# Patient Record
Sex: Female | Born: 1955 | Race: Black or African American | Hispanic: No | Smoking: Former smoker
Health system: Southern US, Community
[De-identification: ages and names within clinical notes are randomized; demographics above are authoritative.]

## PROBLEM LIST (undated history)

## (undated) DIAGNOSIS — G4700 Insomnia, unspecified: Secondary | ICD-10-CM

## (undated) DIAGNOSIS — I4510 Unspecified right bundle-branch block: Secondary | ICD-10-CM

## (undated) DIAGNOSIS — E785 Hyperlipidemia, unspecified: Secondary | ICD-10-CM

## (undated) DIAGNOSIS — H409 Unspecified glaucoma: Secondary | ICD-10-CM

## (undated) DIAGNOSIS — K219 Gastro-esophageal reflux disease without esophagitis: Secondary | ICD-10-CM

## (undated) DIAGNOSIS — A809 Acute poliomyelitis, unspecified: Secondary | ICD-10-CM

## (undated) DIAGNOSIS — N393 Stress incontinence (female) (male): Secondary | ICD-10-CM

## (undated) DIAGNOSIS — G4730 Sleep apnea, unspecified: Secondary | ICD-10-CM

## (undated) DIAGNOSIS — F1910 Other psychoactive substance abuse, uncomplicated: Secondary | ICD-10-CM

## (undated) DIAGNOSIS — J45909 Unspecified asthma, uncomplicated: Secondary | ICD-10-CM

## (undated) DIAGNOSIS — E1140 Type 2 diabetes mellitus with diabetic neuropathy, unspecified: Secondary | ICD-10-CM

## (undated) DIAGNOSIS — IMO0002 Reserved for concepts with insufficient information to code with codable children: Secondary | ICD-10-CM

## (undated) DIAGNOSIS — F419 Anxiety disorder, unspecified: Secondary | ICD-10-CM

## (undated) DIAGNOSIS — I639 Cerebral infarction, unspecified: Secondary | ICD-10-CM

## (undated) DIAGNOSIS — I5032 Chronic diastolic (congestive) heart failure: Secondary | ICD-10-CM

## (undated) DIAGNOSIS — M1990 Unspecified osteoarthritis, unspecified site: Secondary | ICD-10-CM

## (undated) DIAGNOSIS — I10 Essential (primary) hypertension: Secondary | ICD-10-CM

## (undated) DIAGNOSIS — G629 Polyneuropathy, unspecified: Secondary | ICD-10-CM

## (undated) DIAGNOSIS — I739 Peripheral vascular disease, unspecified: Secondary | ICD-10-CM

## (undated) DIAGNOSIS — T7840XA Allergy, unspecified, initial encounter: Secondary | ICD-10-CM

## (undated) DIAGNOSIS — E11311 Type 2 diabetes mellitus with unspecified diabetic retinopathy with macular edema: Secondary | ICD-10-CM

## (undated) DIAGNOSIS — C55 Malignant neoplasm of uterus, part unspecified: Secondary | ICD-10-CM

## (undated) HISTORY — PX: CHOLECYSTECTOMY: SHX55

## (undated) HISTORY — PX: CARPAL TUNNEL RELEASE: SHX101

## (undated) HISTORY — DX: Stress incontinence (female) (male): N39.3

## (undated) HISTORY — PX: OTHER SURGICAL HISTORY: SHX169

## (undated) HISTORY — DX: Allergy, unspecified, initial encounter: T78.40XA

## (undated) HISTORY — DX: Cerebral infarction, unspecified: I63.9

## (undated) HISTORY — DX: Hyperlipidemia, unspecified: E78.5

## (undated) HISTORY — DX: Unspecified right bundle-branch block: I45.10

## (undated) HISTORY — DX: Malignant neoplasm of uterus, part unspecified: C55

## (undated) HISTORY — DX: Unspecified glaucoma: H40.9

## (undated) HISTORY — DX: Reserved for concepts with insufficient information to code with codable children: IMO0002

## (undated) HISTORY — PX: HERNIA REPAIR: SHX51

## (undated) HISTORY — DX: Anxiety disorder, unspecified: F41.9

## (undated) HISTORY — DX: Unspecified osteoarthritis, unspecified site: M19.90

## (undated) HISTORY — DX: Type 2 diabetes mellitus with diabetic neuropathy, unspecified: E11.40

## (undated) HISTORY — DX: Essential (primary) hypertension: I10

## (undated) HISTORY — DX: Type 2 diabetes mellitus with unspecified diabetic retinopathy with macular edema: E11.311

## (undated) HISTORY — DX: Other psychoactive substance abuse, uncomplicated: F19.10

## (undated) HISTORY — DX: Acute poliomyelitis, unspecified: A80.9

## (undated) HISTORY — DX: Chronic diastolic (congestive) heart failure: I50.32

## (undated) HISTORY — DX: Insomnia, unspecified: G47.00

---

## 1998-02-19 ENCOUNTER — Emergency Department (HOSPITAL_COMMUNITY): Admission: EM | Admit: 1998-02-19 | Discharge: 1998-02-19 | Payer: Self-pay | Admitting: Emergency Medicine

## 1998-07-30 DIAGNOSIS — C55 Malignant neoplasm of uterus, part unspecified: Secondary | ICD-10-CM

## 1998-07-30 HISTORY — DX: Malignant neoplasm of uterus, part unspecified: C55

## 2002-08-12 ENCOUNTER — Ambulatory Visit (HOSPITAL_COMMUNITY): Admission: RE | Admit: 2002-08-12 | Discharge: 2002-08-12 | Payer: Self-pay | Admitting: Cardiology

## 2002-12-30 ENCOUNTER — Ambulatory Visit (HOSPITAL_BASED_OUTPATIENT_CLINIC_OR_DEPARTMENT_OTHER): Admission: RE | Admit: 2002-12-30 | Discharge: 2002-12-31 | Payer: Self-pay | Admitting: Orthopedic Surgery

## 2003-01-12 ENCOUNTER — Encounter (HOSPITAL_COMMUNITY): Admission: RE | Admit: 2003-01-12 | Discharge: 2003-02-11 | Payer: Self-pay | Admitting: Orthopedic Surgery

## 2003-02-15 ENCOUNTER — Encounter (HOSPITAL_COMMUNITY): Admission: RE | Admit: 2003-02-15 | Discharge: 2003-03-17 | Payer: Self-pay | Admitting: Orthopedic Surgery

## 2005-02-12 ENCOUNTER — Ambulatory Visit: Payer: Self-pay | Admitting: Family Medicine

## 2005-02-12 ENCOUNTER — Ambulatory Visit (HOSPITAL_COMMUNITY): Admission: RE | Admit: 2005-02-12 | Discharge: 2005-02-12 | Payer: Self-pay | Admitting: Physician Assistant

## 2005-02-16 ENCOUNTER — Encounter (HOSPITAL_COMMUNITY): Admission: RE | Admit: 2005-02-16 | Discharge: 2005-03-18 | Payer: Self-pay | Admitting: Family Medicine

## 2005-03-07 ENCOUNTER — Ambulatory Visit: Payer: Self-pay | Admitting: Family Medicine

## 2005-03-08 ENCOUNTER — Ambulatory Visit (HOSPITAL_COMMUNITY): Admission: RE | Admit: 2005-03-08 | Discharge: 2005-03-08 | Payer: Self-pay | Admitting: Family Medicine

## 2005-03-20 ENCOUNTER — Encounter (HOSPITAL_COMMUNITY): Admission: RE | Admit: 2005-03-20 | Discharge: 2005-04-19 | Payer: Self-pay | Admitting: Family Medicine

## 2005-05-17 ENCOUNTER — Ambulatory Visit: Payer: Self-pay | Admitting: Family Medicine

## 2005-07-26 ENCOUNTER — Ambulatory Visit: Payer: Self-pay | Admitting: Family Medicine

## 2005-07-27 ENCOUNTER — Ambulatory Visit: Payer: Self-pay | Admitting: Family Medicine

## 2005-07-31 ENCOUNTER — Ambulatory Visit: Payer: Self-pay | Admitting: Family Medicine

## 2005-08-03 ENCOUNTER — Ambulatory Visit: Payer: Self-pay | Admitting: Family Medicine

## 2005-09-11 ENCOUNTER — Ambulatory Visit: Payer: Self-pay | Admitting: Family Medicine

## 2005-11-12 ENCOUNTER — Ambulatory Visit: Payer: Self-pay | Admitting: Family Medicine

## 2005-11-20 ENCOUNTER — Ambulatory Visit: Payer: Self-pay | Admitting: Family Medicine

## 2005-12-26 ENCOUNTER — Encounter: Admission: RE | Admit: 2005-12-26 | Discharge: 2005-12-26 | Payer: Self-pay | Admitting: Orthopedic Surgery

## 2006-02-15 ENCOUNTER — Ambulatory Visit: Payer: Self-pay | Admitting: Family Medicine

## 2006-05-31 ENCOUNTER — Ambulatory Visit: Payer: Self-pay | Admitting: Cardiology

## 2006-06-05 ENCOUNTER — Ambulatory Visit: Payer: Self-pay | Admitting: Cardiology

## 2007-07-31 ENCOUNTER — Emergency Department (HOSPITAL_COMMUNITY): Admission: EM | Admit: 2007-07-31 | Discharge: 2007-07-31 | Payer: Self-pay | Admitting: Emergency Medicine

## 2010-12-15 NOTE — Cardiovascular Report (Signed)
NAME:  Kathy Hardy, Kathy Hardy                        ACCOUNT NO.:  [id number]   MEDICAL RECORD NO.:  [id number]                   PATIENT TYPE:  OIB   LOCATION:  2891                                 FACILITY:  [facility]   PHYSICIAN:  Salvadore Farber, M.D. [facility]         DATE OF BIRTH:  01-25-1956   DATE OF PROCEDURE:  08/12/2002  DATE OF DISCHARGE:                              CARDIAC CATHETERIZATION   PROCEDURES:  Left heart catheterization, left ventriculography, coronary  angiography.   INDICATIONS:  The patient is a 55 year old lady with diabetes mellitus and  chest pain.  She has had prior dobutamine Cardiolite in which not 85%  maximal heart rate was not achieved.  At this submaximal work load, there  was no evidence of ischemia.  However, the patient has continued to have  chest discomfort concerning for myocardial ischemia.  She is therefore  referred for diagnostic angiography to clear the air.   DIAGNOSTIC TECHNIQUE:  Informed consent was obtained. Under 1% lidocaine  local anesthesia, a 6 French sheath was placed in the right femoral artery  using the modified Seldinger technique.  The JL4 and JR4 catheters were used  for coronary angiography.  Left heart catheterization and RAO  ventriculography were performed using a pigtail catheter.  The patient  tolerated the procedure well and was transferred to the holding room in  stable condition.  Sheaths could be removed there.   COMPLICATIONS:  None.   FINDINGS:  1. Hemodynamic:  LV 87/1/3.  2. Ventriculography:  Ejection fraction 70% without regional wall motion     abnormality.  There is no aortic stenosis or mitral regurgitation.   CORONARY ANGIOGRAPHY:  1. Left main:  Angiographically normal.  2. LAD:  The LAD is a moderate sized vessel giving rise to a single diagonal     branch.  It is angiographically normal.  3. Circumflex:  The circumflex is a large vessel giving rise to three obtuse     marginal branches.  It is  angiographically normal.  4. RCA:  The RCA is a dominant vessel.  It is angiographically normal.    IMPRESSION/PLAN:  Angiographically normal coronary arteries.  Normal left  ventricular size and systolic function. No aortic stenosis or mitral  regurgitation.  The patient is to follow up with Dr. Celene Skeen and Dr.  Diona Browner.                                                    Salvadore Farber, M.D. [facility]    WED/MEDQ  D:  08/12/2002  T:  08/12/2002  Job:  [id number]   cc:   Jonelle Sidle, M.D. [facility]  [address]  [place]  [place] [address]  Fax: 1   Charlesetta Shanks  [phone].  [place]  [place] [address]  Fax: [phone]

## 2010-12-15 NOTE — Op Note (Signed)
   NAME:  Kathy Hardy, Kathy Hardy NO.:  [id number]   MEDICAL RECORD NO.:  [id number]                  PATIENT TYPE:   LOCATION:                                       FACILITY:  [facility]   PHYSICIAN:  Artist Pais. Mina Marble, M.D.           DATE OF BIRTH:   DATE OF PROCEDURE:  12/30/2002  DATE OF DISCHARGE:                                 OPERATIVE REPORT   PREOPERATIVE DIAGNOSIS:  Left shoulder impingement syndrome with partial  rotator cuff tear.   POSTOPERATIVE DIAGNOSIS:  Left shoulder impingement syndrome with partial  rotator cuff tear.   PROCEDURE:  Left shoulder arthroscopy with subacromial decompression, distal  clavicle excision, rotator cuff debridement.   SURGEON:  Artist Pais. Mina Marble, M.D.   ASSISTANT:  _______________.   ANESTHESIA:  General.   COMPLICATIONS:  None   DRAINS:  None.   OPERATIVE REPORT:  Patient was taken to the operating room after the  induction of adequate general anesthesia.  The left upper extremity was  prepped and draped in the usual sterile fashion after she was placed in the  beach-chair position and well padded over all bony prominences.  Once this  was done, a standard posterior shoulder arthroscopy portal was established,  and scope was introduced into the joint, and an anterior portal was  established under direct vision.  Visualization of the joint revealed an  intact biceps labral insertion, no fraying of the labrum anteriorly,  posteriorly, inferiorly, or superiorly.  There was no evidence of labral  detachment, and the glenoid and the humeral head appeared to be intact.  The  cuff appeared to be intact grossly from gross inspection.  Subacromial  inspection was then undertaken with the instruments placed in subacromial  space.  Subacromial bursectomy and decompression were undertaken using a  suction shaver and a 6-mm burr.  The distal clavicle was also excised  through an anterior portal and a lateral portal.   Once this was done, the  cuff was inspected; there was a small, significantly anterior partial-  thickness tear, which was debrided to a stable rim on the bursal side.  After this was done, the instruments were removed from the portal, the  portals were closed with 4-0 nylon, and a sterile shoulder compression  dressing was applied.  Patient tolerated the procedure well, went to  recovery in stable fashion.                                               Artist Pais Mina Marble, M.D.    MAW/MEDQ  D:  12/30/2002  T:  12/30/2002  Job:  [id number]

## 2010-12-15 NOTE — Assessment & Plan Note (Signed)
[facility]                            [facility] OFFICE NOTE   ELLIYAH, LISZEWSKI                     MRN:          [id number]  DATE:05/31/2006                            DOB:          [birth year]    PRIMARY CARDIOLOGIST:  Jonelle Sidle, M.D.   HISTORY OF THE PRESENT ILLNESS:  Ms. Kercheval is a very pleasant 55 year old  female with no known history of coronary artery disease; however,  with a  history of previous cardiac evaluation in 2003 by Dr. Simona Huh at  [facility].  She was referred for evaluation of chest pain at that  time in the context of multiple cardiac risk factors including insulin-  dependent diabetes mellitus; and, was referred for a dobutamine stress  Cardiolite after ruling out for myocardial infarction.  The perfusion study  showed no focal wall motion abnormalities and normal left ventricular  function; although, however, the patient did not achieve 85% of predicted  heart rate despite the addition of atropine.   The patient is now referred for evaluation of chest pain, which she states  is dissimilar from that when she presented 2003.  This has been waxing and  waning for the past several weeks.  In fact, she apparently was also  hospitalized here at [facility] in August 2007, again, for overnight  observation and again with all serial cardiac markers within normal limits.  Her symptoms are quite atypical.  They are exacerbated by lying in the  supine position or with deep inspiration, occasionally with walking, with  palpation of the lower sternum, and with swallowing food.  Regarding the  latter she, in fact, states that she does have difficulty swallowing and has  a sensation that the food is stuck in her throat.   An electrocardiogram in our office today reveals sinus tachycardia with  nonspecific ST abnormalities.  This was compared with previous EKGs from  2003, which were also notable for sinus  tachycardia and Q waves in leads 3  and aVF.   ALLERGIES:  No known drug allergies.   CURRENT MEDICATIONS:  1. NovoLog 70/30, 30 units twice a day.  2. Plavix.  3. Albuterol inhaler as needed .  4. Neurontin 300 mg daily.  5. Hydrochlorothiazide 25 mg daily.  6. Lotrel 5/20 daily.  7. Clonidine 0.1 twice a day.  8. Premarin.  9. Ambien 10 mg at bedtime.  10.Simvastatin 40 daily.  11.Metformin XR 1,000 mg daily.   PAST MEDICAL HISTORY:  1. Insulin-dependent diabetes mellitus.  2. Hypertension.  3. 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  4. 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  5. Hyperlipidemia.  6. Gastroesophageal reflux disease.  7. Allergic rhinitis.  8. Asthma.   PAST SURGICAL HISTORY:  Status post cholecystectomy.   SOCIAL HISTORY:  The patient has never smoked tobacco.  She denies alcohol  use. She has two grown sons. She is unable to work secondary to her  disability.   FAMILY HISTORY:  Father died of unknown causes when the patient was 55 years  old.  She has a  sister with diabetes and heart problems.   REVIEW OF SYSTEMS:  The patient reports chronic orthopnea for several years  and recent occasional PND.  She reports no significant lower extremity  edema; however, but, also suggests symptoms of lower extremity numbness, but  no clear discomfort.  She has occasional heartburn.  She has dysphagia as  noted.  She has had intermittent chest tightness for the past several weeks  as noted above, which is dissimilar from her prior presentations in 2003.  The remaining systems are negative.   PHYSICAL EXAMINATION:  VITAL SIGNS:  Blood pressure is 124/80, pulse 104 and  regular, and weight 165 pounds.  GENERAL APPEARANCE:  In general this is a 55 year old female, mildly obese  who is sitting and is in no apparent distress.  HEENT:  Normocephalic and atraumatic.  NECK:  Palpable bilateral carotid pulses without bruits.  No JVD.   LUNGS:  The lungs are clear to auscultation in all fields.  HEART:  The heart has a regular rhythm and an increased rate, S1 and S2.  No  significant murmurs.  No rubs or gallops.  ABDOMEN:  The abdomen is soft and nontender.  There is a palpable pulsatile  aorta, but with no associated bruits in the epigastrium.  EXTREMITIES:  Palpable femoral pulses without bruits. Palpable peripheral  pulses, but with diminished right posterior tibialis pulse and diminished  right popliteal pulses.  No significant pedal edema.  NEUROLOGIC EXAMINATION:  Slightly slurred speech, but otherwise alert and  oriented.   IMPRESSION:  Ms. Nile is a 55 year old female with cardiac risk factors,  but no previously documented coronary artery disease, with previous workup  for chest pain in 2003.  At that time she had a nondiagnostic  dobutamine  stress Cardiolite, who is now referred for recurrent, persistent chest pain.  The patient has had two recent hospitalizations here at [facility] and in both  cases ruled out for myocardial infarction with normal serial cardiac  markers.  Her symptoms are quite atypical, and, in fact, are exacerbated by  either lying supine, deep inspiration, palpations, and at times with  swallowing food.  Of note, her electrocardiogram is unchanged from 2003; and  in fact, is notable for persistent sinus tachycardia.  She did have a D-  dimer during this most recent hospitalization, which was negative.   PLAN:  1. Following review with Dr. Willa Rough recommendations to repeat her      stress test (done in 2003) and we will therefore schedule another      dobutamine stress Cardiolite.  2. We did consider the addition of a beta blocker given her persistent      sinus tachycardia, but we will hold off on the addition of this at the      present time.  Of note the preferred choice for her, given her diabetes     would be carvedilol.  Of note, however, the patient also reportedly has       a history of asthma and we would need to keep this in mind.  3. We will plan to have the patient return to the clinic to follow up with      me in approximately two weeks for review of the study results and      further recommendations.      Gene Serpe, PA-C  Electronically Signed     ______________________________  Luis Abed, MD, [facility]   GS/MedQ  DD: 05/31/2006  DT: 06/01/2006  Job #:  [id number]   cc:   Alfredia Client, M.D.

## 2011-03-07 ENCOUNTER — Encounter (INDEPENDENT_AMBULATORY_CARE_PROVIDER_SITE_OTHER): Payer: Self-pay | Admitting: Ophthalmology

## 2011-03-08 ENCOUNTER — Ambulatory Visit: Payer: Self-pay | Admitting: Family Medicine

## 2011-03-14 ENCOUNTER — Encounter (INDEPENDENT_AMBULATORY_CARE_PROVIDER_SITE_OTHER): Payer: Medicaid Other | Admitting: Ophthalmology

## 2011-03-14 DIAGNOSIS — E11359 Type 2 diabetes mellitus with proliferative diabetic retinopathy without macular edema: Secondary | ICD-10-CM

## 2011-03-14 DIAGNOSIS — H3581 Retinal edema: Secondary | ICD-10-CM

## 2011-03-14 DIAGNOSIS — H43819 Vitreous degeneration, unspecified eye: Secondary | ICD-10-CM

## 2011-03-14 DIAGNOSIS — H4310 Vitreous hemorrhage, unspecified eye: Secondary | ICD-10-CM

## 2011-03-15 ENCOUNTER — Encounter: Payer: Self-pay | Admitting: Family Medicine

## 2011-03-15 ENCOUNTER — Ambulatory Visit (INDEPENDENT_AMBULATORY_CARE_PROVIDER_SITE_OTHER): Payer: Medicaid Other | Admitting: Family Medicine

## 2011-03-15 VITALS — BP 140/84 | HR 97 | Ht 62.0 in | Wt 167.0 lb

## 2011-03-15 DIAGNOSIS — I10 Essential (primary) hypertension: Secondary | ICD-10-CM

## 2011-03-15 DIAGNOSIS — E785 Hyperlipidemia, unspecified: Secondary | ICD-10-CM

## 2011-03-15 DIAGNOSIS — E119 Type 2 diabetes mellitus without complications: Secondary | ICD-10-CM

## 2011-03-15 DIAGNOSIS — F411 Generalized anxiety disorder: Secondary | ICD-10-CM | POA: Insufficient documentation

## 2011-03-15 DIAGNOSIS — E118 Type 2 diabetes mellitus with unspecified complications: Secondary | ICD-10-CM | POA: Insufficient documentation

## 2011-03-15 DIAGNOSIS — G8929 Other chronic pain: Secondary | ICD-10-CM

## 2011-03-15 MED ORDER — ATORVASTATIN CALCIUM 40 MG PO TABS: 40.0000 mg | ORAL_TABLET | Freq: Every day | ORAL | Status: DC

## 2011-03-15 MED ORDER — FUROSEMIDE 20 MG PO TABS: 20.0000 mg | ORAL_TABLET | Freq: Every day | ORAL | Status: DC

## 2011-03-15 MED ORDER — CLOPIDOGREL BISULFATE 75 MG PO TABS: 75.0000 mg | ORAL_TABLET | Freq: Every day | ORAL | Status: DC

## 2011-03-15 MED ORDER — INSULIN ASPART PROT & ASPART (70-30 MIX) 100 UNIT/ML ~~LOC~~ SUSP: 35.0000 [IU] | Freq: Two times a day (BID) | SUBCUTANEOUS | Status: DC

## 2011-03-15 MED ORDER — TRAMADOL HCL 50 MG PO TABS: 50.0000 mg | ORAL_TABLET | Freq: Two times a day (BID) | ORAL | Status: DC | PRN

## 2011-03-15 MED ORDER — ALPRAZOLAM 0.5 MG PO TABS: 0.5000 mg | ORAL_TABLET | Freq: Two times a day (BID) | ORAL | Status: DC | PRN

## 2011-03-15 NOTE — Assessment & Plan Note (Signed)
Continue current medications. Labs will be drawn
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
## 2011-03-15 NOTE — Assessment & Plan Note (Signed)
The patient has history of chronic pain. There is a note from her primary care provider which states no narcotics. However this cannot be explained to specific reason. I reviewed the CT scan that she had performed for her abdominal pain which was normal. She also evidently had a hip x-ray which I do not have but her PCP states was negative. She also had an x-ray of her lower back which showed spondylosis but otherwise negative. Her pain does not fit typical back pain with radiculopathy. She previous had radicular symptoms coming from her lower abdomen and possibly pelvis. She has had a hysterectomy and no abnormal bleeding I would consider getting an ultrasound of her pelvis but I'm not sure if this has been done already. Her ovaries likely will not be seen at this stage unless there is a mass. She will also need a pelvic exam. I have given her a short-term supply of tramadol. More records will need to be obtained. It's possible her pain is related to her previous strokes however the abdominal component does not make sense. Polio affects the opposite side.

## 2011-03-15 NOTE — Assessment & Plan Note (Signed)
She has history of anxiety disorder. I have given her a refill on her Xanax. Next visit especially after I have reviewed notes we will discuss long-term medication for her nerves and anxiety such as an SSRI

## 2011-03-15 NOTE — Assessment & Plan Note (Signed)
Patient will continue current regimen of insulin. It appears they were titrating her insulin back-and-forth, she states it was secondary to her stress. I do not see a recent A1c. A1c ordered

## 2011-03-15 NOTE — Patient Instructions (Signed)
For your pain- Take the tramadol twice a day for pain For your diabetes- continue your insulin, I will check your diabetes labs For your nerves- I given a short term supply of xanax For your blood pressure- continue current medications, you have labs that need to be done For your cholesterol- We will check your lipid panel- do not eat after midnight I will get records from your other doctors Follow-up in 6 weeks. Please have your done before then.

## 2011-03-16 ENCOUNTER — Telehealth: Payer: Self-pay | Admitting: Family Medicine

## 2011-03-16 ENCOUNTER — Encounter: Payer: Self-pay | Admitting: Family Medicine

## 2011-03-16 DIAGNOSIS — B91 Sequelae of poliomyelitis: Secondary | ICD-10-CM | POA: Insufficient documentation

## 2011-03-16 DIAGNOSIS — R269 Unspecified abnormalities of gait and mobility: Secondary | ICD-10-CM | POA: Insufficient documentation

## 2011-03-16 DIAGNOSIS — M89669 Osteopathy after poliomyelitis, unspecified lower leg: Secondary | ICD-10-CM

## 2011-03-16 NOTE — Telephone Encounter (Signed)
I will send the script for shower bench. She needs to schedule a quick office visit for the UTI in case meds are needed. She can do this next Wed

## 2011-03-16 NOTE — Telephone Encounter (Signed)
Kathy Hardy She needs a shower bench. She has already ordered it and its ready to be delivered. They just need rx for it with diagnosis.  Also, she said when she was here she forgot to mention she thought she had a bladder infection. Frequency and she can only dribble when she does go. Do you want to see her back for an OV or can she come just give specimen since she was just here. She can't come either way until Wed.

## 2011-03-16 NOTE — Telephone Encounter (Signed)
Script sent. Joyce Gross called her to schedule OV for next week

## 2011-03-19 ENCOUNTER — Encounter: Payer: Self-pay | Admitting: Family Medicine

## 2011-03-19 NOTE — Progress Notes (Signed)
  Subjective:    Patient ID: Kathy Hardy, female    DOB: January 17, 1956, 55 y.o.   MRN: [id number]  HPI    Review of Systems     Objective:   Physical Exam        Assessment & Plan:    Documentation from [facility] shows an evaluation for acute back pain.  the date of service was July 16,2012. I do not see any new medications given. Diagnosis was uncontrolled type diabetes and hyperglycemia as well as sciatica. Labs were done being that was within normal limits with exception of glucose 481

## 2011-03-23 ENCOUNTER — Ambulatory Visit: Payer: Medicaid Other | Admitting: Family Medicine

## 2011-03-23 ENCOUNTER — Encounter (INDEPENDENT_AMBULATORY_CARE_PROVIDER_SITE_OTHER): Payer: Medicaid Other | Admitting: Ophthalmology

## 2011-03-23 DIAGNOSIS — H3581 Retinal edema: Secondary | ICD-10-CM

## 2011-03-23 LAB — COMPREHENSIVE METABOLIC PANEL
Calcium: 10 mg/dL (ref 8.4–10.5)
Chloride: 101 mEq/L (ref 96–112)
Total Bilirubin: 0.5 mg/dL (ref 0.3–1.2)

## 2011-03-23 LAB — LIPID PANEL
Cholesterol: 142 mg/dL (ref 0–200)
HDL: 51 mg/dL (ref >39)
LDL Cholesterol: 73 mg/dL (ref 0–99)
Total CHOL/HDL Ratio: 2.8 Ratio
Triglycerides: 92 mg/dL (ref <150)

## 2011-03-23 LAB — HEMOGLOBIN A1C
Hgb A1c MFr Bld: 11.2 % — ABNORMAL HIGH (ref <5.7)
Mean Plasma Glucose: 275 mg/dL — ABNORMAL HIGH (ref <117)

## 2011-04-02 ENCOUNTER — Encounter: Payer: Self-pay | Admitting: Family Medicine

## 2011-04-05 ENCOUNTER — Ambulatory Visit (INDEPENDENT_AMBULATORY_CARE_PROVIDER_SITE_OTHER): Payer: Medicaid Other | Admitting: Family Medicine

## 2011-04-05 ENCOUNTER — Encounter: Payer: Self-pay | Admitting: Family Medicine

## 2011-04-05 VITALS — BP 100/70 | HR 98 | Resp 16 | Ht 62.0 in | Wt 169.0 lb

## 2011-04-05 DIAGNOSIS — R079 Chest pain, unspecified: Secondary | ICD-10-CM

## 2011-04-05 DIAGNOSIS — N3090 Cystitis, unspecified without hematuria: Secondary | ICD-10-CM

## 2011-04-05 DIAGNOSIS — R0789 Other chest pain: Secondary | ICD-10-CM

## 2011-04-05 DIAGNOSIS — E119 Type 2 diabetes mellitus without complications: Secondary | ICD-10-CM

## 2011-04-05 DIAGNOSIS — G8929 Other chronic pain: Secondary | ICD-10-CM

## 2011-04-05 DIAGNOSIS — N390 Urinary tract infection, site not specified: Secondary | ICD-10-CM

## 2011-04-05 LAB — POCT URINALYSIS DIPSTICK: pH, UA: 5

## 2011-04-05 MED ORDER — CEPHALEXIN 500 MG PO CAPS: 500.0000 mg | ORAL_CAPSULE | Freq: Four times a day (QID) | ORAL | Status: AC

## 2011-04-05 MED ORDER — OMEPRAZOLE 20 MG PO CPDR: 20.0000 mg | DELAYED_RELEASE_CAPSULE | Freq: Every day | ORAL | Status: DC

## 2011-04-05 MED ORDER — INSULIN GLARGINE 100 UNIT/ML ~~LOC~~ SOLN: SUBCUTANEOUS | Status: DC

## 2011-04-05 NOTE — Assessment & Plan Note (Signed)
Reviewed her history, often has multiple complaints of pain, neg work-up for chronic abd pain, no narcotics to be given unless source of pain can be identified

## 2011-04-05 NOTE — Assessment & Plan Note (Signed)
Keflex 500mg  QID x 5 days,sent for culture

## 2011-04-05 NOTE — Progress Notes (Signed)
  Subjective:    Patient ID: Kathy Hardy, female    DOB: September 21, 1955, 55 y.o.   MRN: [id number]  HPI Chest pain- started yesterday, has been having epigastric pain, substernal pain and back pain, has nausea when trying to eat, pain last 30 minutes before it goes away, has it in own and off, initially started when she was sitting on the porch, non radiating, had a small sweat initially but none since, no SOB  Dysuria- continues to have side pain, has had burning sensation and foul smelling urine for past few weeks, unable to urinate as usual states only a few drops come out, has some generalized abd pain   Labs reviewed A1C 11.3%- DM- taking Insulin-- 35 units twice a day, states  She has lots of low in the night and in the AM, if her sugar is near 100 she will not take her morning insulin   Review of Systems see above   GEN- + fatigue, +subjective fever, + Chills, denies weight loss,weakness, recent illness CVS- +chest pain, denies palpitations RESP- denies SOB, cough, wheeze ABD- denies  change in stools, + abd pain GU- + dysuria,denies  hematuria, +dribbling,denies  incontinence        Objective:   Physical Exam GEN- NAD, alert and oriented, ambulates with cane CVS- RRR, no murmur RESP-CTAB ABD- soft, mild TTP suprapubic and Left lower quadrant, pelvic region, no rebound, no gaurding, no mass, no CVA tenderness, ND EKG- NSR, HR 93 no ST changes    Assessment & Plan:

## 2011-04-05 NOTE — Patient Instructions (Signed)
Stop taking the  70/30 inuslin Start taking Lantus 20 units at night- You turn the dial to 20 units Take the acid medication Take the antibiotic four times a day for the next 5 days Return in 1 week with your diabetes numbers

## 2011-04-05 NOTE — Assessment & Plan Note (Signed)
Atypical CP, likely GERD related, start reflux medication, EKG normal

## 2011-04-05 NOTE — Assessment & Plan Note (Addendum)
Poor control. Pt is not eating regularly and skips insulin. Will switch to basal insulin instead of mix, short acting is likely causing hypoglycemic based on history Pt shown how to use Lantus pen in office- start 20 units QHS, RTC 1 week

## 2011-04-09 DIAGNOSIS — R55 Syncope and collapse: Secondary | ICD-10-CM

## 2011-04-12 ENCOUNTER — Ambulatory Visit (INDEPENDENT_AMBULATORY_CARE_PROVIDER_SITE_OTHER): Payer: Medicaid Other | Admitting: Family Medicine

## 2011-04-12 ENCOUNTER — Encounter: Payer: Self-pay | Admitting: Family Medicine

## 2011-04-12 VITALS — BP 154/90 | HR 89 | Resp 16 | Ht 62.0 in | Wt 168.8 lb

## 2011-04-12 DIAGNOSIS — E119 Type 2 diabetes mellitus without complications: Secondary | ICD-10-CM

## 2011-04-12 DIAGNOSIS — J310 Chronic rhinitis: Secondary | ICD-10-CM | POA: Insufficient documentation

## 2011-04-12 DIAGNOSIS — R0789 Other chest pain: Secondary | ICD-10-CM

## 2011-04-12 DIAGNOSIS — I10 Essential (primary) hypertension: Secondary | ICD-10-CM

## 2011-04-12 MED ORDER — FLUTICASONE PROPIONATE 50 MCG/ACT NA SUSP: 1.0000 | Freq: Two times a day (BID) | NASAL | Status: DC | PRN

## 2011-04-12 MED ORDER — ALPRAZOLAM 0.5 MG PO TABS: 0.5000 mg | ORAL_TABLET | Freq: Two times a day (BID) | ORAL | Status: DC | PRN

## 2011-04-12 NOTE — Assessment & Plan Note (Signed)
DM is uncontrolled, in the process of titrating Lantus. Increase Lantus to 22units, pt to bring in meter

## 2011-04-12 NOTE — Patient Instructions (Addendum)
I will set you up for the heart doctor to have a stress test Take your other medications as prescribed Increase your lantus to 22 units at bedtime, bring your meter or your glucose record Increase your acid blocking medication- Prilosec to 1 in the morning and 1 in the evening Use the flonase for your runny nose F/U in 3 weeks

## 2011-04-12 NOTE — Progress Notes (Signed)
  Subjective:    Patient ID: Kathy Hardy, female    DOB: 07/25/1956, 55 y.o.   MRN: [id number]  HPI Pt seen in ED over earlier this week, secondary to chest pain- atypical and elevated BP and glucose. She describes a near syncopal event as well. EKG in the office last week for her CP was normal. This visit was to f/u diabetes mellitus and Lantus titration. Continues to have CP on and off, her friend today states she has a lot of anxiety CP substernal, to epigastric, no SOB, occ nausea  DM- she did not bring her meter, states they were less than 100 fasting past few nights, but very high in the ED Completed antibiotics for UTI  Runny nose for past few days, would like something to help with that and sneezing.- No fever Review of Systems - per above     Objective:   Physical Exam GEN- NAD alert and oriented x 3  HEENT- non injected conjunctiva, PERRL, EOMI, oropharynx clear, nares clear rhinorrhea CVS- RRR, no murmur RESP-CTAB EXT- no edema       Assessment & Plan:

## 2011-04-12 NOTE — Assessment & Plan Note (Signed)
Pt CP, atypical in nature, continue PPI. Will send to cardiology for recurrent CP, she does have risk factors of DM, hyperlipidemia, HTN.

## 2011-04-12 NOTE — Assessment & Plan Note (Signed)
Will recheck at next visit., BP labile. Ensure she is taking meds daily

## 2011-04-12 NOTE — Assessment & Plan Note (Signed)
flonase prn 

## 2011-04-13 ENCOUNTER — Encounter: Payer: Self-pay | Admitting: Family Medicine

## 2011-04-13 ENCOUNTER — Encounter (INDEPENDENT_AMBULATORY_CARE_PROVIDER_SITE_OTHER): Payer: Medicaid Other | Admitting: Ophthalmology

## 2011-04-17 ENCOUNTER — Other Ambulatory Visit: Payer: Self-pay

## 2011-04-17 MED ORDER — LISINOPRIL 40 MG PO TABS: 40.0000 mg | ORAL_TABLET | Freq: Every day | ORAL | Status: DC

## 2011-04-26 ENCOUNTER — Ambulatory Visit: Payer: Medicaid Other | Admitting: Family Medicine

## 2011-04-27 ENCOUNTER — Ambulatory Visit (INDEPENDENT_AMBULATORY_CARE_PROVIDER_SITE_OTHER): Payer: Medicaid Other | Admitting: Cardiovascular Disease

## 2011-04-27 ENCOUNTER — Encounter: Payer: Self-pay | Admitting: Cardiovascular Disease

## 2011-04-27 DIAGNOSIS — R079 Chest pain, unspecified: Secondary | ICD-10-CM

## 2011-04-27 NOTE — Patient Instructions (Signed)
**Note De-Identified Shameek Nyquist Obfuscation** Your physician has requested that you have a lexiscan myoview. For further information please visit [email or web address] Please follow instruction sheet, as given.  Your physician recommends that you continue on your current medications as directed. Please refer to the Current Medication list given to you today.  Your physician recommends that you schedule a follow-up appointment in: as needed. We will contact you with test results.

## 2011-04-27 NOTE — Progress Notes (Signed)
Pt seen in ED the second week in September   secondary to chest pain- atypical and elevated BP and glucose. She describes a near syncopal event as well. EKG in the office last week for her CP was normal.  Continues to have CP on and off, her friend today states she has a lot of anxiety CP substernal, to epigastric, no SOB, occ nausea  Multiple previous visits with Dr Diona Browner.  Seen in 2003, 2004 with normal cath in 2004.  Seen in 2007 with normal myovue.  She is anxious a lot.  Has a son and grandson that have gone to jail.  DM fairly well controlled.  Compliant with BP and cholesterol meds  ROS: Denies fever, malais, weight loss, blurry vision, decreased visual acuity, cough, sputum, SOB, hemoptysis, pleuritic pain, palpitaitons, heartburn, abdominal pain, melena, lower extremity edema, claudication, or rash.  All other systems reviewed and negative   General: Affect appropriate Healthy:  appears stated age HEENT: normal Neck supple with no adenopathy JVP normal no bruits no thyromegaly Lungs clear with no wheezing and good diaphragmatic motion Heart:  S1/S2 no murmur,rub, gallop or click PMI normal Abdomen: benighn, BS positve, no tenderness, no AAA no bruit.  No HSM or HJR Distal pulses intact with no bruits No edema Neuro non-focal Skin warm and dry No muscular weakness  Medications Current Outpatient Prescriptions  Medication Sig Dispense Refill  . ALPRAZolam (XANAX) 0.5 MG tablet Take 1 tablet (0.5 mg total) by mouth 2 (two) times daily as needed for sleep or anxiety.  60 tablet  1  . atorvastatin (LIPITOR) 40 MG tablet Take 1 tablet (40 mg total) by mouth daily.  30 tablet  3  . clopidogrel (PLAVIX) 75 MG tablet Take 1 tablet (75 mg total) by mouth daily.  30 tablet  3  . fluticasone (FLONASE) 50 MCG/ACT nasal spray Place 1 spray into the nose 2 (two) times daily as needed for rhinitis.  16 g  2  . furosemide (LASIX) 20 MG tablet Take 1 tablet (20 mg total) by mouth daily.  30  tablet  3  . insulin glargine (LANTUS SOLOSTAR) 100 UNIT/ML injection Inject 20 units at bedtime  3 mL  3  . lisinopril (PRINIVIL,ZESTRIL) 40 MG tablet Take 1 tablet (40 mg total) by mouth daily.  30 tablet  3  . omeprazole (PRILOSEC) 20 MG capsule Take 1 capsule (20 mg total) by mouth daily.  30 capsule  3  . traMADol (ULTRAM) 50 MG tablet Take 1 tablet (50 mg total) by mouth 2 (two) times daily as needed for pain.  30 tablet  0    Allergies Oxycodone and Percocet  Family History: Family History  Problem Relation Age of Onset  . Diabetes Mother   . Hypertension Mother   . Diabetes Sister     Social History: History   Social History  . Marital Status: Married    Spouse Name: N/A    Number of Children: N/A  . Years of Education: N/A   Occupational History  . Not on file.   Social History Main Topics  . Smoking status: Never Smoker   . Smokeless tobacco: Not on file  . Alcohol Use: No  . Drug Use: No  . Sexually Active: Not on file   Other Topics Concern  . Not on file   Social History Narrative  . No narrative on file    Electrocardiogram:  NSR 88 normal ECG  Assessment and Plan

## 2011-05-04 ENCOUNTER — Ambulatory Visit (INDEPENDENT_AMBULATORY_CARE_PROVIDER_SITE_OTHER): Payer: Medicaid Other | Admitting: *Deleted

## 2011-05-04 ENCOUNTER — Encounter (HOSPITAL_COMMUNITY)
Admission: RE | Admit: 2011-05-04 | Discharge: 2011-05-04 | Disposition: A | Discharge status: Home / Self Care | Payer: Medicaid Other | Source: Ambulatory Visit | Attending: Cardiovascular Disease | Admitting: Cardiovascular Disease

## 2011-05-04 ENCOUNTER — Encounter (HOSPITAL_COMMUNITY): Payer: Self-pay

## 2011-05-04 DIAGNOSIS — R079 Chest pain, unspecified: Secondary | ICD-10-CM

## 2011-05-04 MED ORDER — TECHNETIUM TC 99M TETROFOSMIN IV KIT
10.0000 | PACK | Freq: Once | INTRAVENOUS | Status: AC | PRN
  Administered 2011-05-04: 10 via INTRAVENOUS

## 2011-05-04 MED ORDER — TECHNETIUM TC 99M TETROFOSMIN IV KIT
30.0000 | PACK | Freq: Once | INTRAVENOUS | Status: AC | PRN
  Administered 2011-05-04: 31.2 via INTRAVENOUS

## 2011-05-04 NOTE — Progress Notes (Addendum)
Stress Lab Nurses Notes - Kathy Hardy 05/04/2011  Reason for doing test: Chest Pain  Type of test: Steffanie Dunn  Nurse performing test: Marlena Clipper, RN  Nuclear Medicine Tech: Lou Cal  Echo Tech: Not Applicable  MD performing test: R. Rothbart  Family MD: Milinda Antis  Test explained and consent signed: yes  IV started: 22g jelco, Saline lock flushed, No redness or edema and Saline lock started in radiology  Symptoms:Slight nausea   Treatment/Intervention: None  Reason test stopped: protocol completed  After recovery IV was: Discontinued via X-ray tech and No redness or edema  Patient to return to Nuc. Med at :12:35  Patient discharged: Home  Patient's Condition upon discharge was: stable  Comments: slight nausea, drink given . Symptoms resolved  Angelica Pou  Please See Full Report Of Nuclear Stress Test.

## 2011-05-07 ENCOUNTER — Encounter: Payer: Self-pay | Admitting: Family Medicine

## 2011-05-07 ENCOUNTER — Encounter (INDEPENDENT_AMBULATORY_CARE_PROVIDER_SITE_OTHER): Payer: Medicaid Other | Admitting: Ophthalmology

## 2011-05-07 ENCOUNTER — Ambulatory Visit (INDEPENDENT_AMBULATORY_CARE_PROVIDER_SITE_OTHER): Payer: Medicaid Other | Admitting: Family Medicine

## 2011-05-07 DIAGNOSIS — E11359 Type 2 diabetes mellitus with proliferative diabetic retinopathy without macular edema: Secondary | ICD-10-CM

## 2011-05-07 DIAGNOSIS — Z23 Encounter for immunization: Secondary | ICD-10-CM

## 2011-05-07 DIAGNOSIS — G8929 Other chronic pain: Secondary | ICD-10-CM

## 2011-05-07 DIAGNOSIS — Z1239 Encounter for other screening for malignant neoplasm of breast: Secondary | ICD-10-CM

## 2011-05-07 DIAGNOSIS — E119 Type 2 diabetes mellitus without complications: Secondary | ICD-10-CM

## 2011-05-07 DIAGNOSIS — Z1211 Encounter for screening for malignant neoplasm of colon: Secondary | ICD-10-CM

## 2011-05-07 DIAGNOSIS — R0789 Other chest pain: Secondary | ICD-10-CM

## 2011-05-07 DIAGNOSIS — R300 Dysuria: Secondary | ICD-10-CM

## 2011-05-07 DIAGNOSIS — I10 Essential (primary) hypertension: Secondary | ICD-10-CM

## 2011-05-07 MED ORDER — INFLUENZA VAC TYPES A & B PF IM SUSP: 0.5000 mL | Freq: Once | INTRAMUSCULAR | Status: DC

## 2011-05-07 MED ORDER — METOPROLOL SUCCINATE ER 25 MG PO TB24: 25.0000 mg | ORAL_TABLET | Freq: Every day | ORAL | Status: DC

## 2011-05-07 NOTE — Assessment & Plan Note (Signed)
Patient did not bring any blood sugar log today. She's currently on Lantus 22 units. If she is truthful her blood sugars have improved with the increase in dose. At this time we'll continue her current dose. Patient will call if her blood sugars are severely elevated or if she is having hypoglycemic episodes

## 2011-05-07 NOTE — Patient Instructions (Addendum)
We will set you up for a Mammogram I will send a referral for a colonoscopy  Continue your current dose of insulin - 22 units and take at bedtime  Your A1C is 11% , your goal is less than 7% Continue the ultram for your arthritis Today you received your flu shot  Start the new blood pressure pill Bring the urine sample back in- we will check for infection Do not take the antibiotic pills unless I tell you there is a urine infection  Next visit in 3 months

## 2011-05-07 NOTE — Assessment & Plan Note (Signed)
Atypical recurrent chest pain. Likely GERD versus anxiety. I would not increase anxiety meds she has for this today. She also typically asked for pain medication no narcotics will be given. Reassure Myoview. Patient also had multiple caths in the past per the cardiology note.

## 2011-05-07 NOTE — Progress Notes (Signed)
  Subjective:    Patient ID: Kathy Hardy, female    DOB: 30-Nov-1955, 55 y.o.   MRN: [id number]  HPI   DM- blood sugars fasting- 90- low 100's, in the eveing 180-190, did not bring in meter or list today,  Taking 22 units of insulin   Had a low sugar of 68 last Sunday - fasting   Arthritis in hands- worse today, has pains all over- using ultram  Dysuria- unable to give urine sample, feels she may have UTI, urine dark and has foul odor, last set of antibiotics she took a day for 4 days, did not follow instructions on bottle which was QID x 5 days  Chest pain- reviewed cardiology notes, normal myoview, continues to have chest pain at times, feels anxious, using GERD medication. She was not given results per report from heart doctor.  Review of Systems  GEN- denies fatigue, fever, weight loss,weakness, recent illness CVS- +chest pain, denies palpitations RESP- denies SOB, cough, wheeze ABD- denies  change in stools, + abd pain GU- + dysuria,denies  hematuria, +dribbling,denies  Incontinence MSK- +joint pain-multiple sites  Needs Flu shot today    Objective:   Physical Exam GEN-NAD, alert and oriented CVS-RRR, no murmur RESP-CTAB EXT- no edema       Assessment & Plan:

## 2011-05-07 NOTE — Assessment & Plan Note (Addendum)
Patient cannot give urine sample today. She did not complete antibiotics previously as prescribed I will prefer to have a positive culture before prescribing any other medications. She will bring in a sampl   Urine culture showed enterobacter- will give 7 days of Bactrim

## 2011-05-07 NOTE — Assessment & Plan Note (Signed)
No change in meds. Patient can continue Ultram. I do not feel she needs narcotics

## 2011-05-07 NOTE — Assessment & Plan Note (Signed)
Patient has severely elevated blood pressure the cardiology office. I will add metoprolol to her regimen today

## 2011-05-15 ENCOUNTER — Encounter (INDEPENDENT_AMBULATORY_CARE_PROVIDER_SITE_OTHER): Payer: Medicaid Other | Admitting: Ophthalmology

## 2011-05-15 DIAGNOSIS — E11359 Type 2 diabetes mellitus with proliferative diabetic retinopathy without macular edema: Secondary | ICD-10-CM

## 2011-05-21 ENCOUNTER — Ambulatory Visit (HOSPITAL_COMMUNITY)
Admission: RE | Admit: 2011-05-21 | Discharge: 2011-05-21 | Disposition: A | Discharge status: Home / Self Care | Payer: Medicaid Other | Source: Ambulatory Visit | Attending: Family Medicine | Admitting: Family Medicine

## 2011-05-21 DIAGNOSIS — Z1239 Encounter for other screening for malignant neoplasm of breast: Secondary | ICD-10-CM

## 2011-05-21 DIAGNOSIS — Z1231 Encounter for screening mammogram for malignant neoplasm of breast: Secondary | ICD-10-CM | POA: Insufficient documentation

## 2011-05-21 LAB — URINE CULTURE: Colony Count: >=100000

## 2011-05-21 MED ORDER — SULFAMETHOXAZOLE-TRIMETHOPRIM 800-160 MG PO TABS: 1.0000 | ORAL_TABLET | Freq: Two times a day (BID) | ORAL | Status: AC

## 2011-05-21 NOTE — Progress Notes (Signed)
Addended by: Milinda Antis F on: 05/21/2011 12:53 PM   Modules accepted: Orders

## 2011-05-28 ENCOUNTER — Telehealth: Payer: Self-pay

## 2011-05-28 NOTE — Telephone Encounter (Signed)
Called pt to triage for colonoscopy. She said she has some constipation and sees a little blood sometimes when she has to strain. Scheduled appt for 06/11/2011 @ 10:30 Am with KJ. Pt said she has to schedule out because she rides the bus and has to let them know in advance. She also had another appt in between. She said she had a colonoscopy many years ago at [facility] by Dr. Linna Darner. Told her we will try to get those records. Also, spoke with Judithe Modest, her friend, and gave him the info for the appt per pt.

## 2011-05-28 NOTE — Telephone Encounter (Signed)
Kathy Hardy from [facility] said she has went as far back as she could and she does not see a colonoscopy report

## 2011-06-11 ENCOUNTER — Ambulatory Visit: Payer: Medicaid Other | Admitting: Urgent Care

## 2011-06-12 ENCOUNTER — Other Ambulatory Visit: Payer: Self-pay

## 2011-06-12 MED ORDER — ALPRAZOLAM 0.5 MG PO TABS: 0.5000 mg | ORAL_TABLET | Freq: Two times a day (BID) | ORAL | Status: DC | PRN

## 2011-06-18 ENCOUNTER — Encounter: Payer: Self-pay | Admitting: Urgent Care

## 2011-06-18 ENCOUNTER — Ambulatory Visit (INDEPENDENT_AMBULATORY_CARE_PROVIDER_SITE_OTHER): Payer: Medicaid Other | Admitting: Urgent Care

## 2011-06-18 DIAGNOSIS — R110 Nausea: Secondary | ICD-10-CM | POA: Insufficient documentation

## 2011-06-18 DIAGNOSIS — K5909 Other constipation: Secondary | ICD-10-CM | POA: Insufficient documentation

## 2011-06-18 DIAGNOSIS — K5900 Constipation, unspecified: Secondary | ICD-10-CM

## 2011-06-18 MED ORDER — RABEPRAZOLE SODIUM 20 MG PO TBEC: 20.0000 mg | DELAYED_RELEASE_TABLET | Freq: Every day | ORAL | Status: DC

## 2011-06-18 MED ORDER — POLYETHYLENE GLYCOL 3350 17 GM/SCOOP PO POWD: 17.0000 g | Freq: Every day | ORAL | Status: AC

## 2011-06-18 NOTE — Progress Notes (Signed)
Please call pt to arrange colonoscopy and EGD in OR with propofol reason: Polypharmacy. Please see diabetes instructions below. Please let patient know Dr. Darrick Penna would like her to remain on her Plavix for the procedure. Thanks

## 2011-06-18 NOTE — Assessment & Plan Note (Signed)
She has chronic nausea, vomiting, heartburn indigestion. We'll begin PPI. This is new onset, therefore she will need an EGD to rule out complicated GERD, peptic ulcer disease, gastritis.  I have discussed risks & benefits which include, but are not limited to, bleeding, infection, perforation & drug reaction.  The patient agrees with this plan & written consent will be obtained.  Procedure will need to be done with deep sedation (propofol) in the OR under the direction of anesthesia services for polypharmacy.  Begin aciphex 20mg  daily for acid reflux

## 2011-06-18 NOTE — Patient Instructions (Addendum)
Use miralax 17 grams daily as needed for constipation Begin aciphex 20mg  daily for acid reflux  We will call you with instructions regarding your plavix for your procedure Take 1/2 your Lantus (11 units) the night before your procedure.  Make an Early morning appointment-hold diabetes medications/insulin day of procedure Bring all your medications and/or any insulin to the hospital the day of the procedure. Follow blood sugars, call us or your PCP if any problems.

## 2011-06-18 NOTE — Progress Notes (Signed)
Informing Crystal, since this cannot be routed to her.

## 2011-06-18 NOTE — Assessment & Plan Note (Addendum)
Kathy Hardy is a 55 y.o. female with chronic constipation likely made worse from her chronic narcotic use. She is due for screening colonoscopy. She is on Plavix. Management of her Plavix will be discussed with Dr. Darrick Penna prior to her procedure.  I have discussed risks & benefits which include, but are not limited to, bleeding, infection, perforation & drug reaction.  The patient agrees with this plan & written consent will be obtained.  Procedure will need to be done with deep sedation (propofol) in the OR under the direction of anesthesia services for polypharmacy.  Use miralax 17 grams daily as needed for constipation  We will call you with instructions regarding your plavix for your procedure Take 1/2 your Lantus (11 units) the night before your procedure.  Make an Early morning appointment-hold diabetes medications/insulin day of procedure Bring all your medications and/or any insulin to the hospital the day of the procedure. Follow blood sugars, call us or your PCP if any problems.

## 2011-06-18 NOTE — Progress Notes (Signed)
Referring Provider: Milinda Antis, MD Primary Care Physician:  Milinda Antis, MD, MD Primary Gastroenterologist:  Dr. Darrick Penna  Chief Complaint  Patient presents with  . Colonoscopy    HPI:  Kathy Hardy is a 55 y.o. female here as a referral from Dr. Jeanice Lim for colonoscopy.  She has been having lingering LLQ throbbing pain that is intermittent.  Flares up for hrs, then better.  C/o constipation & spinach seems to help.  Pain not relieved w/ defecation.  BM q1-2days .  Uses tramadol for pain & this makes her constipation worse.   Denies rectal bleeding or melena.  Denies fever or chills.  She also C/o nausea & vomiting couple times last week.  Usually vomits couple times per week for several weeks.  C/o bitter taste in mouth all the time.  Nausea worse before eating, other times worse after eating.  Keeps heartburn & indigestion.  Has not been on a PPI for this. Denies dysphagia & odynophagia.  Wt stable.  Appetite ok.      Past Medical History  Diagnosis Date  . Hypertension   . Diabetes mellitus   . Hyperlipidemia   . Anxiety   . 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 Arthritis   . Stroke     TIAx 4,   . DDD (degenerative disc disease)     lumbar  . Insomnia   . Uterine cancer 2000    unknown what type  . Diabetic macular edema     s/p laser    Past Surgical History  Procedure Date  . Cholecystectomy   . Right leg surgery x 4 (polio)   . Abdominal hysterectomy     Current Outpatient Prescriptions  Medication Sig Dispense Refill  . ALPRAZolam (XANAX) 0.5 MG tablet Take 1 tablet (0.5 mg total) by mouth 2 (two) times daily as needed for sleep or anxiety.  60 tablet  1  . atorvastatin (LIPITOR) 40 MG tablet Take 1 tablet (40 mg total) by mouth daily.  30 tablet  3  . clopidogrel (PLAVIX) 75 MG tablet Take 1 tablet (75 mg total) by mouth daily.  30 tablet  3  . fluticasone (FLONASE) 50 MCG/ACT nasal spray Place 1 spray into the nose 2 (two) times daily as needed for rhinitis.  16  g  2  . furosemide (LASIX) 20 MG tablet Take 1 tablet (20 mg total) by mouth daily.  30 tablet  3  . insulin glargine (LANTUS SOLOSTAR) 100 UNIT/ML injection Inject 20 units at bedtime  3 mL  3  . lisinopril (PRINIVIL,ZESTRIL) 40 MG tablet Take 1 tablet (40 mg total) by mouth daily.  30 tablet  3  . metoprolol succinate (TOPROL XL) 25 MG 24 hr tablet Take 1 tablet (25 mg total) by mouth daily.  30 tablet  3  . omeprazole (PRILOSEC) 20 MG capsule Take 1 capsule (20 mg total) by mouth daily.  30 capsule  3  . traMADol (ULTRAM) 50 MG tablet Take 1 tablet (50 mg total) by mouth 2 (two) times daily as needed for pain.  30 tablet  0   Current Facility-Administered Medications  Medication Dose Route Frequency Provider Last Rate Last Dose  . Influenza (>/= 3 years) inactive virus vaccine (FLVIRIN/FLUZONE) injection SUSP 0.5 mL  0.5 mL Intramuscular Once Syliva Overman, MD        Allergies as of 06/18/2011 - Review Complete 06/18/2011  Allergen Reaction Noted  . Oxycodone Other (See Comments) 03/15/2011  .  Percocet (oxycodone-acetaminophen) Other (See Comments) 03/15/2011    Family History:There is no known family history of colorectal carcinoma , liver disease, or inflammatory bowel disease.  Problem Relation Age of Onset  . Diabetes Mother   . Hypertension Mother   . Diabetes Sister   . Cancer Maternal Grandmother     type?   History   Social History  . Marital Status: Married    Spouse Name: N/A    Number of Children: 2  . Years of Education: N/A   Occupational History  . disabled    Social History Main Topics  . Smoking status: Never Smoker   . Smokeless tobacco: Not on file  . Alcohol Use: No  . Drug Use: No  . Sexually Active: Not on file  Review of Systems: Gen: Denies any fever, chills, sweats, anorexia, fatigue, weakness, malaise, weight loss, and sleep disorder CV: Denies chest pain, angina, palpitations, syncope, orthopnea, PND, peripheral edema, and  claudication. Resp: Denies dyspnea at rest, dyspnea with exercise, cough, sputum, wheezing, coughing up blood, and pleurisy. GI: Denies vomiting blood, jaundice, and fecal incontinence.   Denies dysphagia or odynophagia. GU : Complains of recent urinary tract infection. Has completed antibiotics. Symptoms improved.  MS: Some joint pain and limitation of movement with history of polio. She ambulates with a cane. She does have chronic pain.   Derm: Denies rash, itching, dry skin, hives, moles, warts, or unhealing ulcers.  Psych: Denies depression, anxiety, memory loss, suicidal ideation, hallucinations, paranoia, and confusion. Heme: Denies bruising, bleeding, and enlarged lymph nodes. Endocrine: Her hemoglobin A1c has been in the 11 range. Her blood sugars have been high.  Physical Exam: BP 147/90  Pulse 93  Temp(Src) 98.5 F (36.9 C) (Temporal)  Ht 5\' 2"  (1.575 m)  Wt 172 lb 6.4 oz (78.2 kg)  BMI 31.53 kg/m2 General:   Alert,  Well-developed, well-nourished, pleasant and cooperative in NAD Head:  Normocephalic and atraumatic. Eyes:  Sclera clear, no icterus.   Conjunctiva pink. Ears:  Normal auditory acuity. Nose:  No deformity, discharge,  or lesions. Mouth:  No deformity or lesions, oropharynx pink & moist. Neck:  Supple; no masses or thyromegaly. Lungs:  Clear throughout to auscultation.   No wheezes, crackles, or rhonchi. No acute distress. Heart:  Regular rate and rhythm; no murmurs, clicks, rubs,  or gallops. Abdomen:  Soft, nontender and nondistended. No masses, hepatosplenomegaly or hernias noted. Normal bowel sounds, without guarding, and without rebound.   Rectal:  Deferred until time of colonoscopy.   Msk:  Symmetrical without gross deformities. Normal posture. Pulses:  Normal pulses noted. Extremities:  Without clubbing or edema. Neurologic:  Alert and  oriented x4;  grossly normal neurologically. Skin:  Intact without significant lesions or rashes. Cervical Nodes:  No  significant cervical adenopathy. Psych:  Alert and cooperative. Normal mood and affect.

## 2011-06-18 NOTE — Progress Notes (Signed)
Cc to PCP 

## 2011-06-18 NOTE — Progress Notes (Signed)
REVIEWED. AGREE.  CONTINUE PLAVIX.

## 2011-06-19 ENCOUNTER — Other Ambulatory Visit: Payer: Self-pay | Admitting: Gastroenterology

## 2011-06-19 ENCOUNTER — Telehealth: Payer: Self-pay | Admitting: Gastroenterology

## 2011-06-19 DIAGNOSIS — K5909 Other constipation: Secondary | ICD-10-CM

## 2011-06-19 NOTE — Telephone Encounter (Signed)
Pt is scheduled for TCS/EGD in the OR on 07/09/11- Unable to reach pt by phone- mailed instructions and letter

## 2011-06-19 NOTE — Progress Notes (Signed)
Kathy Hardy has scheduled.

## 2011-06-26 ENCOUNTER — Telehealth: Payer: Self-pay | Admitting: Family Medicine

## 2011-06-26 MED ORDER — ROSUVASTATIN CALCIUM 10 MG PO TABS: 10.0000 mg | ORAL_TABLET | Freq: Every day | ORAL | Status: DC

## 2011-06-26 NOTE — Telephone Encounter (Signed)
Patient aware.

## 2011-06-26 NOTE — Telephone Encounter (Signed)
pls let pt know ins is requiring trial of another med in place of atorvastatin, I recommend crestor 10mg  pls erx #30 refill 4 after speaking with her

## 2011-06-28 ENCOUNTER — Encounter (HOSPITAL_COMMUNITY): Payer: Self-pay | Admitting: Pharmacy Technician

## 2011-06-29 NOTE — Pre-Procedure Instructions (Signed)
Per Dr. Tollie Eth, pt is to take her full dose of Lantus the night before TCS/EGD in OR on 07/09/11 due to Hgb A1C on 03/15/2011 being 11.2.

## 2011-06-29 NOTE — Patient Instructions (Addendum)
20 YZABELLA CRUNK  06/29/2011   Your procedure is scheduled on:  07/09/2011  Report to [facility] at 08:30 AM.  Call this number if you have problems the morning of surgery: [phone]   Remember:   Do not eat food:After Midnight.  May have clear liquids:until Midnight .  Clear liquids include soda, tea, black coffee, apple or grape juice, broth.  Take these medicines the morning of surgery with A SIP OF WATER: Xanax, Lisinopril, Metoprolol, Omeprazole, Aciphex, Tramdol. (Make sure to take your full dose of Lantus, 22 units, the night before surgery).   Do not wear jewelry, make-up or nail polish.  Do not wear lotions, powders, or perfumes. You may wear deodorant.  Do not shave 48 hours prior to surgery.  Do not bring valuables to the hospital.  Contacts, dentures or bridgework may not be worn into surgery.  Leave suitcase in the car. After surgery it may be brought to your room.  For patients admitted to the hospital, checkout time is 11:00 AM the day of discharge.   Patients discharged the day of surgery will not be allowed to drive home.  Name and phone number of your driver:   Special Instructions: N/A   Please read over the following fact sheets that you were given: Anesthesia Post-op Instructions     Esophagogastroduodenoscopy This is an endoscopic procedure (a procedure that uses a device like a flexible telescope) that allows your caregiver to view the upper stomach and small bowel. This test allows your caregiver to look at the esophagus. The esophagus carries food from your mouth to your stomach. They can also look at your duodenum. This is the first part of the small intestine that attaches to the stomach. This test is used to detect problems in the bowel such as ulcers and inflammation. PREPARATION FOR TEST Nothing to eat after midnight the day before the test. NORMAL FINDINGS Normal esophagus, stomach, and duodenum. Ranges for normal findings may vary among different  laboratories and hospitals. You should always check with your doctor after having lab work or other tests done to discuss the meaning of your test results and whether your values are considered within normal limits. MEANING OF TEST  Your caregiver will go over the test results with you and discuss the importance and meaning of your results, as well as treatment options and the need for additional tests if necessary. OBTAINING THE TEST RESULTS It is your responsibility to obtain your test results. Ask the lab or department performing the test when and how you will get your results. Document Released: 11/16/2004 Document Revised: 03/28/2011 Document Reviewed: 06/25/2008 [facility] Patient Information 2012 [place], [place].     Colonoscopy A colonoscopy is an exam to evaluate your entire colon. In this exam, your colon is cleansed. A long fiberoptic tube is inserted through your rectum and into your colon. The fiberoptic scope (endoscope) is a long bundle of enclosed and very flexible fibers. These fibers transmit light to the area examined and send images from that area to your caregiver. Discomfort is usually minimal. You may be given a drug to help you sleep (sedative) during or prior to the procedure. This exam helps to detect lumps (tumors), polyps, inflammation, and areas of bleeding. Your caregiver may also take a small piece of tissue (biopsy) that will be examined under a microscope. LET YOUR CAREGIVER KNOW ABOUT:   Allergies to food or medicine.   Medicines taken, including vitamins, herbs, eyedrops, over-the-counter medicines, and creams.  Use of steroids (by mouth or creams).   Previous problems with anesthetics or numbing medicines.   History of bleeding problems or blood clots.   Previous surgery.   Other health problems, including diabetes and kidney problems.   Possibility of pregnancy, if this applies.  BEFORE THE PROCEDURE   A clear liquid diet may be required for 2 days  before the exam.   Ask your caregiver about changing or stopping your regular medications.   Liquid injections (enemas) or laxatives may be required.   A large amount of electrolyte solution may be given to you to drink over a short period of time. This solution is used to clean out your colon.   You should be present 60 minutes prior to your procedure or as directed by your caregiver.  AFTER THE PROCEDURE   If you received a sedative or pain relieving medication, you will need to arrange for someone to drive you home.   Occasionally, there is a little blood passed with the first bowel movement. Do not be concerned.  FINDING OUT THE RESULTS OF YOUR TEST Not all test results are available during your visit. If your test results are not back during the visit, make an appointment with your caregiver to find out the results. Do not assume everything is normal if you have not heard from your caregiver or the medical facility. It is important for you to follow up on all of your test results. HOME CARE INSTRUCTIONS   It is not unusual to pass moderate amounts of gas and experience mild abdominal cramping following the procedure. This is due to air being used to inflate your colon during the exam. Walking or a warm pack on your belly (abdomen) may help.   You may resume all normal meals and activities after sedatives and medicines have worn off.   Only take over-the-counter or prescription medicines for pain, discomfort, or fever as directed by your caregiver. Do not use aspirin or blood thinners if a biopsy was taken. Consult your caregiver for medicine usage if biopsies were taken.  SEEK IMMEDIATE MEDICAL CARE IF:   You have a fever.   You pass large blood clots or fill a toilet with blood following the procedure. This may also occur 10 to 14 days following the procedure. This is more likely if a biopsy was taken.   You develop abdominal pain that keeps getting worse and cannot be relieved  with medicine.  Document Released: 07/13/2000 Document Revised: 03/28/2011 Document Reviewed: 02/26/2008 [facility] Patient Information 2012 [place], [place].    PATIENT INSTRUCTIONS POST-ANESTHESIA  IMMEDIATELY FOLLOWING SURGERY:  Do not drive or operate machinery for the first twenty four hours after surgery.  Do not make any important decisions for twenty four hours after surgery or while taking narcotic pain medications or sedatives.  If you develop intractable nausea and vomiting or a severe headache please notify your doctor immediately.  FOLLOW-UP:  Please make an appointment with your surgeon as instructed. You do not need to follow up with anesthesia unless specifically instructed to do so.  WOUND CARE INSTRUCTIONS (if applicable):  Keep a dry clean dressing on the anesthesia/puncture wound site if there is drainage.  Once the wound has quit draining you may leave it open to air.  Generally you should leave the bandage intact for twenty four hours unless there is drainage.  If the epidural site drains for more than 36-48 hours please call the anesthesia department.  QUESTIONS?:  Please feel free  to call your physician or the hospital operator if you have any questions, and they will be happy to assist you.     [facility] Anesthesia Department [address] [place] [phone]

## 2011-07-02 ENCOUNTER — Encounter (HOSPITAL_COMMUNITY): Payer: Self-pay

## 2011-07-02 ENCOUNTER — Encounter (HOSPITAL_COMMUNITY)
Admission: RE | Admit: 2011-07-02 | Discharge: 2011-07-02 | Disposition: A | Discharge status: Home / Self Care | Payer: Medicaid Other | Source: Ambulatory Visit | Attending: Gastroenterology | Admitting: Gastroenterology

## 2011-07-02 LAB — BASIC METABOLIC PANEL
CO2: 30 mEq/L (ref 19–32)
Glucose, Bld: 138 mg/dL — ABNORMAL HIGH (ref 70–99)
Potassium: 4.1 mEq/L (ref 3.5–5.1)
Sodium: 139 mEq/L (ref 135–145)

## 2011-07-02 LAB — CBC
HCT: 40.7 % (ref 36.0–46.0)
Hemoglobin: 13.5 g/dL (ref 12.0–15.0)
RBC: 4.5 MIL/uL (ref 3.87–5.11)

## 2011-07-09 ENCOUNTER — Encounter (HOSPITAL_COMMUNITY): Payer: Self-pay | Admitting: Anesthesiology

## 2011-07-09 ENCOUNTER — Other Ambulatory Visit: Payer: Self-pay | Admitting: Gastroenterology

## 2011-07-09 ENCOUNTER — Encounter (HOSPITAL_COMMUNITY)
Admission: RE | Disposition: A | Discharge status: Home / Self Care | Payer: Self-pay | Source: Ambulatory Visit | Attending: Gastroenterology

## 2011-07-09 ENCOUNTER — Ambulatory Visit (HOSPITAL_COMMUNITY)
Admission: RE | Admit: 2011-07-09 | Discharge: 2011-07-09 | Disposition: A | Discharge status: Home / Self Care | Payer: Medicaid Other | Source: Ambulatory Visit | Attending: Gastroenterology | Admitting: Gastroenterology

## 2011-07-09 DIAGNOSIS — R109 Unspecified abdominal pain: Secondary | ICD-10-CM

## 2011-07-09 DIAGNOSIS — R1032 Left lower quadrant pain: Secondary | ICD-10-CM | POA: Insufficient documentation

## 2011-07-09 DIAGNOSIS — E119 Type 2 diabetes mellitus without complications: Secondary | ICD-10-CM | POA: Insufficient documentation

## 2011-07-09 DIAGNOSIS — K2990 Gastroduodenitis, unspecified, without bleeding: Secondary | ICD-10-CM

## 2011-07-09 DIAGNOSIS — K2940 Chronic atrophic gastritis without bleeding: Secondary | ICD-10-CM | POA: Insufficient documentation

## 2011-07-09 DIAGNOSIS — I10 Essential (primary) hypertension: Secondary | ICD-10-CM | POA: Insufficient documentation

## 2011-07-09 DIAGNOSIS — Z79899 Other long term (current) drug therapy: Secondary | ICD-10-CM | POA: Insufficient documentation

## 2011-07-09 DIAGNOSIS — Z01812 Encounter for preprocedural laboratory examination: Secondary | ICD-10-CM | POA: Insufficient documentation

## 2011-07-09 DIAGNOSIS — K5900 Constipation, unspecified: Secondary | ICD-10-CM

## 2011-07-09 DIAGNOSIS — Z794 Long term (current) use of insulin: Secondary | ICD-10-CM | POA: Insufficient documentation

## 2011-07-09 DIAGNOSIS — K2970 Gastritis, unspecified, without bleeding: Secondary | ICD-10-CM

## 2011-07-09 DIAGNOSIS — K648 Other hemorrhoids: Secondary | ICD-10-CM

## 2011-07-09 HISTORY — PX: COLONOSCOPY: SHX174

## 2011-07-09 HISTORY — PX: ESOPHAGOGASTRODUODENOSCOPY: SHX1529

## 2011-07-09 LAB — GLUCOSE, CAPILLARY: Glucose-Capillary: 100 mg/dL — ABNORMAL HIGH (ref 70–99)

## 2011-07-09 SURGERY — COLONOSCOPY WITH PROPOFOL
Anesthesia: Monitor Anesthesia Care | Site: Throat

## 2011-07-09 MED ORDER — LIDOCAINE HCL (CARDIAC) 10 MG/ML IV SOLN
INTRAVENOUS | Status: DC | PRN
  Administered 2011-07-09: 40 mg via INTRAVENOUS

## 2011-07-09 MED ORDER — LACTATED RINGERS IV SOLN
INTRAVENOUS | Status: DC | PRN
  Administered 2011-07-09: 10:00:00 via INTRAVENOUS

## 2011-07-09 MED ORDER — MIDAZOLAM HCL 2 MG/2ML IJ SOLN
INTRAMUSCULAR | Status: AC
  Filled 2011-07-09: qty 2

## 2011-07-09 MED ORDER — GLYCOPYRROLATE 0.2 MG/ML IJ SOLN
INTRAMUSCULAR | Status: AC
  Filled 2011-07-09: qty 1

## 2011-07-09 MED ORDER — STERILE WATER FOR IRRIGATION IR SOLN
Status: DC | PRN
  Administered 2011-07-09: 11:00:00

## 2011-07-09 MED ORDER — PROPOFOL 10 MG/ML IV EMUL
INTRAVENOUS | Status: DC | PRN
  Administered 2011-07-09: 125 ug/kg/min via INTRAVENOUS

## 2011-07-09 MED ORDER — MIDAZOLAM HCL 5 MG/5ML IJ SOLN
INTRAMUSCULAR | Status: DC | PRN
  Administered 2011-07-09: 2 mg via INTRAVENOUS

## 2011-07-09 MED ORDER — FENTANYL CITRATE 0.05 MG/ML IJ SOLN
INTRAMUSCULAR | Status: AC
  Filled 2011-07-09: qty 2

## 2011-07-09 MED ORDER — MIDAZOLAM HCL 2 MG/2ML IJ SOLN
1.0000 mg | INTRAMUSCULAR | Status: DC | PRN
  Administered 2011-07-09: 2 mg via INTRAVENOUS

## 2011-07-09 MED ORDER — LIDOCAINE HCL (PF) 1 % IJ SOLN
INTRAMUSCULAR | Status: AC
  Filled 2011-07-09: qty 5

## 2011-07-09 MED ORDER — FENTANYL CITRATE 0.05 MG/ML IJ SOLN
INTRAMUSCULAR | Status: DC | PRN
  Administered 2011-07-09 (×2): 50 ug via INTRAVENOUS

## 2011-07-09 MED ORDER — ONDANSETRON HCL 4 MG/2ML IJ SOLN
INTRAMUSCULAR | Status: AC
  Filled 2011-07-09: qty 2

## 2011-07-09 MED ORDER — EPHEDRINE SULFATE 50 MG/ML IJ SOLN
INTRAMUSCULAR | Status: DC | PRN
  Administered 2011-07-09: 10 mg via INTRAVENOUS

## 2011-07-09 MED ORDER — ONDANSETRON HCL 4 MG/2ML IJ SOLN
4.0000 mg | Freq: Once | INTRAMUSCULAR | Status: AC
  Administered 2011-07-09: 4 mg via INTRAVENOUS

## 2011-07-09 MED ORDER — GLYCOPYRROLATE 0.2 MG/ML IJ SOLN
0.2000 mg | Freq: Once | INTRAMUSCULAR | Status: AC | PRN
  Administered 2011-07-09: 0.2 mg via INTRAVENOUS

## 2011-07-09 MED ORDER — PROPOFOL 10 MG/ML IV EMUL
INTRAVENOUS | Status: AC
  Filled 2011-07-09: qty 20

## 2011-07-09 MED ORDER — LACTATED RINGERS IV SOLN
INTRAVENOUS | Status: DC
  Administered 2011-07-09: 10:00:00 via INTRAVENOUS

## 2011-07-09 MED ORDER — EPHEDRINE SULFATE 50 MG/ML IJ SOLN
INTRAMUSCULAR | Status: AC
  Filled 2011-07-09: qty 1

## 2011-07-09 SURGICAL SUPPLY — 24 items
BLOCK BITE 60FR ADLT L/F BLUE (MISCELLANEOUS) ×3 IMPLANT
ELECT REM PT RETURN 9FT ADLT (ELECTROSURGICAL)
ELECTRODE REM PT RTRN 9FT ADLT (ELECTROSURGICAL) IMPLANT
FCP BXJMBJMB 240X2.8X (CUTTING FORCEPS)
FLOOR PAD 36X40 (MISCELLANEOUS) ×3
FORCEP RJ3 GP 1.8X160 W-NEEDLE (CUTTING FORCEPS) IMPLANT
FORCEPS BIOP RAD 4 LRG CAP 4 (CUTTING FORCEPS) IMPLANT
FORCEPS BIOP RJ4 240 W/NDL (CUTTING FORCEPS)
FORCEPS BXJMBJMB 240X2.8X (CUTTING FORCEPS) IMPLANT
INJECTOR/SNARE I SNARE (MISCELLANEOUS) IMPLANT
LUBRICANT JELLY 4.5OZ STERILE (MISCELLANEOUS) ×3 IMPLANT
MANIFOLD NEPTUNE II (INSTRUMENTS) ×3 IMPLANT
NEEDLE SCLEROTHERAPY 25GX240 (NEEDLE) IMPLANT
PAD FLOOR 36X40 (MISCELLANEOUS) ×2 IMPLANT
PROBE APC STR FIRE (PROBE) IMPLANT
PROBE INJECTION GOLD (MISCELLANEOUS)
PROBE INJECTION GOLD 7FR (MISCELLANEOUS) IMPLANT
SNARE ROTATE MED OVAL 20MM (MISCELLANEOUS) IMPLANT
SNARE SHORT THROW 13M SML OVAL (MISCELLANEOUS) IMPLANT
SYR 50ML LL SCALE MARK (SYRINGE) ×3 IMPLANT
TRAP SPECIMEN MUCOUS 40CC (MISCELLANEOUS) IMPLANT
TUBING ENDO SMARTCAP PENTAX (MISCELLANEOUS) ×6 IMPLANT
TUBING IRRIGATION ENDOGATOR (MISCELLANEOUS) ×3 IMPLANT
WATER STERILE IRR 1000ML POUR (IV SOLUTION) ×3 IMPLANT

## 2011-07-09 NOTE — Interval H&P Note (Signed)
History and Physical Interval Note:  07/09/2011 9:55 AM  Kathy Hardy  has presented today for surgery, with the diagnosis of Chronic constipation & Chronic nausea  The various methods of treatment have been discussed with the patient and family. After consideration of risks, benefits and other options for treatment, the patient has consented to  Procedure(s): COLONOSCOPY WITH PROPOFOL ESOPHAGOGASTRODUODENOSCOPY (EGD) WITH PROPOFOL as a surgical intervention .  The patients' history has been reviewed, patient examined, no change in status, stable for surgery.  I have reviewed the patients' chart and labs.  Questions were answered to the patient's satisfaction.     [employer]

## 2011-07-09 NOTE — Op Note (Signed)
[facility] [address] [place], [place]  [address]  ENDOSCOPY PROCEDURE REPORT  PATIENT:  Kathy, Hardy  MR#:  [id number] BIRTHDATE:  10/21/1955, 55 yrs. old  GENDER:  female  ENDOSCOPIST:  Jonette Eva, MD Referred by:  Milinda Antis, M.D.  PROCEDURE DATE:  07/09/2011 PROCEDURE:  EGD with biopsy, 43239 ASA CLASS: INDICATIONS:  abdominal pain  MEDICATIONS:   MAC sedation, administered by CRNA TOPICAL ANESTHETIC:  Cetacaine Spray  DESCRIPTION OF PROCEDURE:     Physical exam was performed. Informed consent was obtained from the patient after explaining the benefits, risks, and alternatives to the procedure.  The patient was connected to the monitor and placed in the left lateral position.  Continuous oxygen was provided by nasal cannula and IV medicine administered through an indwelling cannula.  After administration of sedation, the patient's esophagus was intubated and the  endoscope was advanced under direct visualization to the second portion of the duodenum.  The scope was removed slowly by carefully examining the color, texture, anatomy, and integrity of the mucosa on the way out.  The patient was recovered in endoscopy and discharged home in satisfactory condition. <<PROCEDUREIMAGES>>  Mild gastritis was found & BIOPSIED VIA COLD FORCEPS. NL ESOPHAGUS AND DUODENUM.  COMPLICATIONS:    None  ENDOSCOPIC IMPRESSION: 1) Mild gastritis  RECOMMENDATIONS: Continue OMP OR ACIPHEX FOLLOW UP IN 3 MOS. AWAIT BIOPSIES.  REPEAT EXAM:  No  ______________________________ Jonette Eva, MD  CC:  n. eSIGNED:   Alli Jasmer at 07/09/2011 01:56 PM  Rise Patience, [phone]

## 2011-07-09 NOTE — Preoperative (Signed)
Beta Blockers   Reason not to administer Beta Blockers:Not Applicable 

## 2011-07-09 NOTE — H&P (Signed)
BP Pulse Temp(Src) Ht Wt BMI    147/90  93  98.5 F (36.9 C) (Temporal)  5\' 2"  (1.575 m)  172 lb 6.4 oz (78.2 kg)  31.53 kg/m2       Progress Notes     Lorenza Burton, NP  06/18/2011 11:03 AM  Signed Referring Provider: Milinda Antis, MD Primary Care Physician:  Milinda Antis, MD, MD Primary Gastroenterologist:  Dr. Darrick Penna    Chief Complaint   Patient presents with   .  Colonoscopy      HPI:  Kathy Hardy is a 55 y.o. female here as a referral from Dr. Jeanice Lim for colonoscopy.  She has been having lingering LLQ throbbing pain that is intermittent.  Flares up for hrs, then better.  C/o constipation & spinach seems to help.  Pain not relieved w/ defecation.  BM q1-2days .  Uses tramadol for pain & this makes her constipation worse.   Denies rectal bleeding or melena.  Denies fever or chills.   She also C/o nausea & vomiting couple times last week.  Usually vomits couple times per week for several weeks.  C/o bitter taste in mouth all the time.  Nausea worse before eating, other times worse after eating.  Keeps heartburn & indigestion.  Has not been on a PPI for this. Denies dysphagia & odynophagia.  Wt stable.  Appetite ok.        Past Medical History   Diagnosis  Date   .  Hypertension     .  Diabetes mellitus     .  Hyperlipidemia     .  Anxiety     .  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  Arthritis     .  Stroke         TIAx 4,    .  DDD (degenerative disc disease)         lumbar   .  Insomnia     .  Uterine cancer  2000       unknown what type   .  Diabetic macular edema         s/p laser       Past Surgical History   Procedure  Date   .  Cholecystectomy     .  Right leg surgery x 4 (polio)     .  Abdominal hysterectomy         Current Outpatient Prescriptions   Medication  Sig  Dispense  Refill   .  ALPRAZolam (XANAX) 0.5 MG tablet  Take 1 tablet (0.5 mg total) by mouth 2 (two) times daily as needed for sleep or anxiety.   60 tablet   1   .   atorvastatin (LIPITOR) 40 MG tablet  Take 1 tablet (40 mg total) by mouth daily.   30 tablet   3   .  clopidogrel (PLAVIX) 75 MG tablet  Take 1 tablet (75 mg total) by mouth daily.   30 tablet   3   .  fluticasone (FLONASE) 50 MCG/ACT nasal spray  Place 1 spray into the nose 2 (two) times daily as needed for rhinitis.   16 g   2   .  furosemide (LASIX) 20 MG tablet  Take 1 tablet (20 mg total) by mouth daily.   30 tablet   3   .  insulin glargine (LANTUS SOLOSTAR) 100 UNIT/ML injection  Inject 20 units at bedtime   3 mL   3   .  lisinopril (PRINIVIL,ZESTRIL) 40 MG tablet  Take 1 tablet (40 mg total) by mouth daily.   30 tablet   3   .  metoprolol succinate (TOPROL XL) 25 MG 24 hr tablet  Take 1 tablet (25 mg total) by mouth daily.   30 tablet   3   .  omeprazole (PRILOSEC) 20 MG capsule  Take 1 capsule (20 mg total) by mouth daily.   30 capsule   3   .  traMADol (ULTRAM) 50 MG tablet  Take 1 tablet (50 mg total) by mouth 2 (two) times daily as needed for pain.   30 tablet   0       Current Facility-Administered Medications   Medication  Dose  Route  Frequency  Provider  Last Rate  Last Dose   .  Influenza (>/= 3 years) inactive virus vaccine (FLVIRIN/FLUZONE) injection SUSP 0.5 mL   0.5 mL  Intramuscular  Once  Syliva Overman, MD             Allergies as of 06/18/2011 - Review Complete 06/18/2011   Allergen  Reaction  Noted   .  Oxycodone  Other (See Comments)  03/15/2011   .  Percocet (oxycodone-acetaminophen)  Other (See Comments)  03/15/2011       Family History:There is no known family history of colorectal carcinoma , liver disease, or inflammatory bowel disease.   Problem  Relation  Age of Onset   .  Diabetes  Mother     .  Hypertension  Mother     .  Diabetes  Sister     .  Cancer  Maternal Grandmother         type?       History       Social History   .  Marital Status:  Married       Spouse Name:  N/A       Number of Children:  2   .  Years of Education:  N/A         Occupational History   .  disabled         Social History Main Topics   .  Smoking status:  Never Smoker    .  Smokeless tobacco:  Not on file   .  Alcohol Use:  No   .  Drug Use:  No   .  Sexually Active:  Not on file    Review of Systems: Gen: Denies any fever, chills, sweats, anorexia, fatigue, weakness, malaise, weight loss, and sleep disorder CV: Denies chest pain, angina, palpitations, syncope, orthopnea, PND, peripheral edema, and claudication. Resp: Denies dyspnea at rest, dyspnea with exercise, cough, sputum, wheezing, coughing up blood, and pleurisy. GI: Denies vomiting blood, jaundice, and fecal incontinence.   Denies dysphagia or odynophagia. GU : Complains of recent urinary tract infection. Has completed antibiotics. Symptoms improved.   MS: Some joint pain and limitation of movement with history of polio. She ambulates with a cane. She does have chronic pain.    Derm: Denies rash, itching, dry skin, hives, moles, warts, or unhealing ulcers.   Psych: Denies depression, anxiety, memory loss, suicidal ideation, hallucinations, paranoia, and confusion. Heme: Denies bruising, bleeding, and enlarged lymph nodes. Endocrine: Her hemoglobin A1c has been in the 11 range. Her blood sugars have been high.   Physical Exam: BP 147/90  Pulse 93  Temp(Src) 98.5 F (  36.9 C) (Temporal)  Ht 5\' 2"  (1.575 m)  Wt 172 lb 6.4 oz (78.2 kg)  BMI 31.53 kg/m2 General:   Alert,  Well-developed, well-nourished, pleasant and cooperative in NAD Head:  Normocephalic and atraumatic. Eyes:  Sclera clear, no icterus.   Conjunctiva pink. Ears:  Normal auditory acuity. Nose:  No deformity, discharge,  or lesions. Mouth:  No deformity or lesions, oropharynx pink & moist. Neck:  Supple; no masses or thyromegaly. Lungs:  Clear throughout to auscultation.   No wheezes, crackles, or rhonchi. No acute distress. Heart:  Regular rate and rhythm; no murmurs, clicks, rubs,  or gallops. Abdomen:  Soft,  nontender and nondistended. No masses, hepatosplenomegaly or hernias noted. Normal bowel sounds, without guarding, and without rebound.    Rectal:  Deferred until time of colonoscopy.    Msk:  Symmetrical without gross deformities. Normal posture. Pulses:  Normal pulses noted. Extremities:  Without clubbing or edema. Neurologic:  Alert and  oriented x4;  grossly normal neurologically. Skin:  Intact without significant lesions or rashes. Cervical Nodes:  No significant cervical adenopathy. Psych:  Alert and cooperative. Normal mood and affect.     Jonette Eva, MD  06/18/2011  1:06 PM  Signed REVIEWED. AGREE.   CONTINUE PLAVIX.  Lorenza Burton, NP  06/18/2011  1:38 PM  Signed Please call pt to arrange colonoscopy and EGD in OR with propofol reason: Polypharmacy. Please see diabetes instructions below. Please let patient know Dr. Darrick Penna would like her to remain on her Plavix for the procedure. Thanks  Cloria Spring, LPN, LPN  [id number]/5784  4:04 PM  Signed Informing Crystal, since this cannot be routed to her.    Glendora Score  06/18/2011  4:04 PM  Signed Cc to PCP  Cloria Spring, LPN, LPN  69/62/9528  1:52 PM  Signed Crystal has scheduled.       Chronic constipation Lorenza Burton, NP  06/18/2011 11:02 AM  Addendum CARMIE LANPHER is a 55 y.o. female with chronic constipation likely made worse from her chronic narcotic use. She is due for screening colonoscopy. She is on Plavix. Management of her Plavix will be discussed with Dr. Darrick Penna prior to her procedure.  I have discussed risks & benefits which include, but are not limited to, bleeding, infection, perforation & drug reaction.  The patient agrees with this plan & written consent will be obtained.  Procedure will need to be done with deep sedation (propofol) in the OR under the direction of anesthesia services for polypharmacy.   Use miralax 17 grams daily as needed for constipation   We will call you with instructions  regarding your plavix for your procedure Take 1/2 your Lantus (11 units) the night before your procedure.   Make an Early morning appointment-hold diabetes medications/insulin day of procedure Bring all your medications and/or any insulin to the hospital the day of the procedure. Follow blood sugars, call us or your PCP if any problems.  Previous Version  Chronic nausea Lorenza Burton, NP  06/18/2011 11:01 AM  Signed She has chronic nausea, vomiting, heartburn indigestion. We'll begin PPI. This is new onset, therefore she will need an EGD to rule out complicated GERD, peptic ulcer disease, gastritis.  I have discussed risks & benefits which include, but are not limited to, bleeding, infection, perforation & drug reaction.  The patient agrees with this plan & written consent will be obtained.  Procedure will need to be done with deep sedation (propofol) in the OR under the  direction of anesthesia services for polypharmacy.   Begin aciphex 20mg  daily for acid reflux

## 2011-07-09 NOTE — Anesthesia Procedure Notes (Addendum)
Procedure Name: MAC Date/Time: 07/09/2011 10:19 AM Performed by: Carolyne Littles, Harold Moncus Pre-anesthesia Checklist: Patient identified, Patient being monitored, Emergency Drugs available, Timeout performed and Suction available Patient Re-evaluated:Patient Re-evaluated prior to inductionOxygen Delivery Method: Simple face mask

## 2011-07-09 NOTE — Anesthesia Preprocedure Evaluation (Signed)
Anesthesia Evaluation  Patient identified by MRN, date of birth, ID band Patient awake    Reviewed: Allergy & Precautions, H&P , NPO status , Patient's Chart, lab work & pertinent test results  History of Anesthesia Complications Negative for: history of anesthetic complications  Airway Mallampati: II      Dental  (+) Teeth Intact   Pulmonary neg pulmonary ROS,  clear to auscultation        Cardiovascular hypertension, Pt. on medications Regular Normal    Neuro/Psych PSYCHIATRIC DISORDERS Anxiety CVA, No Residual Symptoms    GI/Hepatic   Endo/Other  Diabetes mellitus-, Well Controlled, Type 2, Insulin Dependent and Oral Hypoglycemic Agents  Renal/GU      Musculoskeletal   Abdominal   Peds  Hematology   Anesthesia Other Findings   Reproductive/Obstetrics                           Anesthesia Physical Anesthesia Plan  ASA: III  Anesthesia Plan: MAC   Post-op Pain Management:    Induction: Intravenous  Airway Management Planned: Simple Face Mask  Additional Equipment:   Intra-op Plan:   Post-operative Plan:   Informed Consent: I have reviewed the patients History and Physical, chart, labs and discussed the procedure including the risks, benefits and alternatives for the proposed anesthesia with the patient or authorized representative who has indicated his/her understanding and acceptance.     Plan Discussed with:   Anesthesia Plan Comments:         Anesthesia Quick Evaluation

## 2011-07-09 NOTE — Op Note (Signed)
[facility] [address] [place], [place]  [address]  COLONOSCOPY PROCEDURE REPORT  PATIENT:  Kathy, Hardy  MR#:  [id number] BIRTHDATE:  25-Oct-1955, 55 yrs. old  GENDER:  female  ENDOSCOPIST:  Jonette Eva, MD REF. BY:  Milinda Antis, M.D. ASSISTANT:  PROCEDURE DATE:  07/09/2011 PROCEDURE:  Colonoscopy 19[address]  INDICATIONS:  ABDOMINAL PAIN, CONSTIPATION, RECTAL URGENCY AFTER EATING PSHX: CHOLECYSTECTOMY  MEDICATIONS:   MAC sedation, administered by CRNA  DESCRIPTION OF PROCEDURE:    Physical exam was performed. Informed consent was obtained from the patient after explaining the benefits, risks, and alternatives to procedure.  The patient was connected to monitor and placed in left lateral position. Continuous oxygen was provided by nasal cannula and IV medicine administered through an indwelling cannula.  After administration of sedation and rectal exam, the patient's rectum was intubated and the  colonoscope was advanced under direct visualization to the cecum.  The scope was removed slowly by carefully examining the color, texture, anatomy, and integrity mucosa on the way out. The patient was recovered in endoscopy and discharged home in satisfactory condition. <<PROCEDUREIMAGES>>  FINDINGS:  The prep was not adequate to allow appropriate inspection of the mucosa in the right colon.  NO POLYPS N THE LEFT COLON. SMALL INTERNAL Hemorrhoids were found.  PREP QUALITY: POOR INTIGHT COLON, GOOD IN LEFT COLON CECAL W/D TIME:   8 minutes  COMPLICATIONS:    None  ENDOSCOPIC IMPRESSION: 1) Poor prep in the right colon 2) Internal hemorrhoids  RECOMMENDATIONS: TCS IN 5 YEARS WITH 2 DAY BOWEL PREP. HIGH FIBER DIET.  REPEAT EXAM:  No  ______________________________ Jonette Eva, MD  CC:  Milinda Antis, M.D.  n. eSIGNEDDuncan Dull Durelle Zepeda at 07/09/2011 01:05 PM  Rise Patience, [phone]

## 2011-07-09 NOTE — Transfer of Care (Signed)
Immediate Anesthesia Transfer of Care Note  Patient: Kathy Hardy  Procedure(s) Performed:  COLONOSCOPY WITH PROPOFOL - In cecum @ 1036   withdrawal time .; ESOPHAGOGASTRODUODENOSCOPY (EGD) WITH PROPOFOL  Patient Location: PACU  Anesthesia Type: MAC  Level of Consciousness: awake, alert  and oriented  Airway & Oxygen Therapy: Patient Spontanous Breathing and Patient connected to face mask oxygen  Post-op Assessment: Report given to PACU RN and Post -op Vital signs reviewed and stable  Post vital signs: Reviewed and stable  Complications: No apparent anesthesia complications

## 2011-07-09 NOTE — Anesthesia Postprocedure Evaluation (Signed)
  Anesthesia Post-op Note  Patient: Kathy Hardy  Procedure(s) Performed:  COLONOSCOPY WITH PROPOFOL - In cecum @ 1036   withdrawal time .; ESOPHAGOGASTRODUODENOSCOPY (EGD) WITH PROPOFOL  Patient Location: PACU  Anesthesia Type: MAC  Level of Consciousness: awake, alert  and oriented  Airway and Oxygen Therapy: Patient Spontanous Breathing  Post-op Pain: mild  Post-op Assessment: Post-op Vital signs reviewed  Post-op Vital Signs: Reviewed and stable  Complications: No apparent anesthesia complications

## 2011-07-12 ENCOUNTER — Telehealth: Payer: Self-pay | Admitting: Gastroenterology

## 2011-07-12 NOTE — Telephone Encounter (Signed)
Please call pt. HER stomach Bx shows gastritis. Continue OMP 30 minutes prior to meals ONCE A DAY OR ACIPHEX DAILY. OPV MAR 2013. TSC IN 5 YEARS W/ 2 DAY BOWEL PREP.

## 2011-07-12 NOTE — Telephone Encounter (Signed)
Results Cc to PCP  

## 2011-07-12 NOTE — Telephone Encounter (Signed)
Pt informed

## 2011-07-30 ENCOUNTER — Encounter: Payer: Self-pay | Admitting: Gastroenterology

## 2011-07-30 NOTE — Telephone Encounter (Signed)
OV scheduled for 03/28

## 2011-08-09 ENCOUNTER — Encounter: Payer: Self-pay | Admitting: Gastroenterology

## 2011-08-09 ENCOUNTER — Other Ambulatory Visit: Payer: Self-pay

## 2011-08-09 ENCOUNTER — Ambulatory Visit: Payer: Medicaid Other | Admitting: Family Medicine

## 2011-08-09 MED ORDER — OMEPRAZOLE 20 MG PO CPDR: 20.0000 mg | DELAYED_RELEASE_CAPSULE | Freq: Every day | ORAL | Status: DC

## 2011-08-09 MED ORDER — CLOPIDOGREL BISULFATE 75 MG PO TABS: 75.0000 mg | ORAL_TABLET | ORAL | Status: DC

## 2011-08-09 MED ORDER — ALPRAZOLAM 0.5 MG PO TABS: 0.5000 mg | ORAL_TABLET | Freq: Two times a day (BID) | ORAL | Status: DC | PRN

## 2011-08-09 MED ORDER — TRAMADOL HCL 50 MG PO TABS: 50.0000 mg | ORAL_TABLET | Freq: Two times a day (BID) | ORAL | Status: DC | PRN

## 2011-08-16 ENCOUNTER — Ambulatory Visit (INDEPENDENT_AMBULATORY_CARE_PROVIDER_SITE_OTHER): Payer: Medicaid Other | Admitting: Family Medicine

## 2011-08-16 ENCOUNTER — Encounter: Payer: Self-pay | Admitting: Family Medicine

## 2011-08-16 DIAGNOSIS — I10 Essential (primary) hypertension: Secondary | ICD-10-CM

## 2011-08-16 DIAGNOSIS — J309 Allergic rhinitis, unspecified: Secondary | ICD-10-CM

## 2011-08-16 DIAGNOSIS — F411 Generalized anxiety disorder: Secondary | ICD-10-CM

## 2011-08-16 DIAGNOSIS — E119 Type 2 diabetes mellitus without complications: Secondary | ICD-10-CM

## 2011-08-16 DIAGNOSIS — R109 Unspecified abdominal pain: Secondary | ICD-10-CM | POA: Insufficient documentation

## 2011-08-16 MED ORDER — FLUOXETINE HCL 20 MG PO CAPS: 20.0000 mg | ORAL_CAPSULE | Freq: Every day | ORAL | Status: DC

## 2011-08-16 MED ORDER — LORATADINE 10 MG PO TABS: 10.0000 mg | ORAL_TABLET | Freq: Every day | ORAL | Status: DC

## 2011-08-16 MED ORDER — FLUTICASONE PROPIONATE 50 MCG/ACT NA SUSP: 1.0000 | Freq: Every day | NASAL | Status: DC

## 2011-08-16 NOTE — Assessment & Plan Note (Signed)
No red flags on exam. Abdominal exam is normal. I am concerned she has a day for pain every visit. No narcotics. She will increase her tramadol as needed.

## 2011-08-16 NOTE — Assessment & Plan Note (Signed)
Claritin and Flonase

## 2011-08-16 NOTE — Progress Notes (Signed)
  Subjective:    Patient ID: Kathy Hardy, female    DOB: 05-01-1956, 56 y.o.   MRN: [id number]  HPI  Right side pain x 4 weeks- throbbing pain, tramadol  has not helped ,no specific injury, no change in bowel or bladder    HTN- took her medication this AM, has not missed any doses    Allergies- nose draining a lot, eyes watering, feels like her previous allergies, no sneezing       Has used claritin in the past which has helped, out of nose spray   DM- did not bring meter or log, fasting CBG was 103 this AM, highest CBG up to 400's she did not take her insulin that day, no hypoglycemic episodes, no polyuria, polydipsia    GAD- not sleeping well, has a lot of stress- her son is incaretaed and grandson incarcerated, she is tired in the day therefore naps more. She's been on a mood stabilizer in the past which has helped. No apparent suicidal ideations.    Patient needs FMLA form completed today   Review of Systems   GEN- denies fatigue, fever, weight loss,weakness, recent illness  HEENT- no change in vision CVS- +chest pain, occ palpitations RESP- denies SOB, cough, wheeze ABD- denies  change in stools, + abd pain/side pain GU- denies dysuria,denies  hematuria, +dribbling,denies  Incontinence MSK- +joint pain-multiple sites       Objective:   Physical Exam  GEN- NAD, alert and oriented x3 HEENT- PERRL, EOMI, non injected sclera, pink conjunctiva, MMM, oropharynx clear Neck- Supple, no thryomegaly, no carotid bruit CVS- RRR, no murmur RESP-CTAB ABD- NABS, SOFT, NT, ND, no organomegaly Side- RIght side TTP, no bruising or ecchymosis over rib region EXT- No edema Pulses- Radial, DP- 2+ Gait- walks with cane, post polio  Diabetic Foot Exam- Decreased monofilament on left foot on lateral aspect + callus formation, No open lesion, thick nails, ingrown nails       Assessment & Plan:

## 2011-08-16 NOTE — Assessment & Plan Note (Signed)
Diabetes is not controlled. Will increase her Lantus to 25 units at bedtime. Obtain labs

## 2011-08-16 NOTE — Patient Instructions (Addendum)
Increase Lantus to 25units at bedtime Get your labs done You can take 2 xanax at bedtime as needed Start the prozac daily for your anxiety  Call the foot doctor Continue your ultram you can take 2 tablets at one time if needed Use the claritin and flonase for your allergies F/u 4 weeks for diabetes

## 2011-08-16 NOTE — Assessment & Plan Note (Signed)
Will start Prozac. Patient may increase her benzo diazepam at bedtime.

## 2011-08-16 NOTE — Assessment & Plan Note (Signed)
Ensure patient is taking medications correctly. She took her medication right before visit therefore I'm unsure when her actual blood pressure is today. Will followup in a few weeks. If still elevated will increase her metoprolol

## 2011-08-17 ENCOUNTER — Encounter (HOSPITAL_COMMUNITY): Payer: Self-pay | Admitting: Cardiology

## 2011-08-27 ENCOUNTER — Telehealth: Payer: Self-pay | Admitting: Family Medicine

## 2011-08-28 NOTE — Telephone Encounter (Signed)
pls fwd to pcp Dr Jeanice Lim

## 2011-08-29 NOTE — Telephone Encounter (Signed)
She needs to call her CAP service and tell them she is moving. I recently recertified some paperwork for her so she needs to inform them of the change and they will send me any forms that are needed. I can send a script for a shower seat and bar to [facility].

## 2011-08-29 NOTE — Telephone Encounter (Signed)
Spoke with pt and she said that she will call CAP program and let them know of address change. Pt also stated that she only needs shower bar and not seat.

## 2011-09-03 ENCOUNTER — Telehealth: Payer: Self-pay | Admitting: Family Medicine

## 2011-09-05 NOTE — Telephone Encounter (Signed)
Spoke with pt and she is to followup with [employer] as well as IT trainer on aging and have forms sent that she needs sent to her landlord.

## 2011-09-06 ENCOUNTER — Other Ambulatory Visit: Payer: Self-pay | Admitting: Family Medicine

## 2011-09-13 ENCOUNTER — Ambulatory Visit: Payer: Medicaid Other | Admitting: Family Medicine

## 2011-09-17 ENCOUNTER — Telehealth: Payer: Self-pay | Admitting: Family Medicine

## 2011-09-17 ENCOUNTER — Ambulatory Visit (INDEPENDENT_AMBULATORY_CARE_PROVIDER_SITE_OTHER): Payer: Medicaid Other | Admitting: Ophthalmology

## 2011-09-19 NOTE — Telephone Encounter (Signed)
Called pt. Voicemail had not been set up and there was no option to leave message

## 2011-09-19 NOTE — Telephone Encounter (Signed)
She is coming tomorrow. Landlord needs Dr to write a note that her aide is able to use the code to get the the door when she is unable to make it to the door. The landlord said he would need a letter from the doctor stating why the aide would need the code before he will give it to her. The aides name is Lucendia Herrlich [place])

## 2011-09-19 NOTE — Telephone Encounter (Signed)
Will give note for landlord, pt can pick-up at visit Medications reviewed with CAP/DA assessment, only additional medication was the eye drop

## 2011-09-20 ENCOUNTER — Ambulatory Visit (INDEPENDENT_AMBULATORY_CARE_PROVIDER_SITE_OTHER): Payer: Medicaid Other | Admitting: Family Medicine

## 2011-09-20 ENCOUNTER — Encounter: Payer: Self-pay | Admitting: Family Medicine

## 2011-09-20 DIAGNOSIS — J019 Acute sinusitis, unspecified: Secondary | ICD-10-CM | POA: Insufficient documentation

## 2011-09-20 DIAGNOSIS — R109 Unspecified abdominal pain: Secondary | ICD-10-CM

## 2011-09-20 DIAGNOSIS — I10 Essential (primary) hypertension: Secondary | ICD-10-CM

## 2011-09-20 DIAGNOSIS — F411 Generalized anxiety disorder: Secondary | ICD-10-CM

## 2011-09-20 DIAGNOSIS — E119 Type 2 diabetes mellitus without complications: Secondary | ICD-10-CM

## 2011-09-20 MED ORDER — AMOXICILLIN-POT CLAVULANATE 875-125 MG PO TABS: 1.0000 | ORAL_TABLET | Freq: Two times a day (BID) | ORAL | Status: AC

## 2011-09-20 MED ORDER — ACETAMINOPHEN-CODEINE 300-30 MG PO TABS: 1.0000 | ORAL_TABLET | Freq: Two times a day (BID) | ORAL | Status: DC | PRN

## 2011-09-20 MED ORDER — METOPROLOL SUCCINATE ER 50 MG PO TB24: 50.0000 mg | ORAL_TABLET | Freq: Every day | ORAL | Status: DC

## 2011-09-20 NOTE — Assessment & Plan Note (Signed)
Continue lantus at current dose, fasting sugars have improved, obtain A1C

## 2011-09-20 NOTE — Assessment & Plan Note (Signed)
Augmentin x 10 days, because of worsening and prolonged symptoms

## 2011-09-20 NOTE — Assessment & Plan Note (Signed)
Improved even though pt dealing with her landlord right now. Continue prozac and benzo

## 2011-09-20 NOTE — Progress Notes (Addendum)
  Subjective:    Patient ID: Kathy Hardy, female    DOB: 1955/08/15, 56 y.o.   MRN: [id number]  HPI Diabetes mellitus- blood sugars have been in the 100s fasting. She is taking 25 units of Lantus. No labs have been done she will get them done next week. No no hypoglycemia  Anxiety- started on Prozac at last visit 4 weeks ago secondary to increased anxiety and stress. She is in a stressful point right now because of her new apartment has  many regulations and she's not getting along with the landlord. She thinks the Prozac is helping  even out her mood. She has increased her Xanax at bedtime which also helps. Her family and friends has seen improvement in her mood.  Side pain- she has continued right sided rib pain. She has fallen in the past but none recently. The pain is nagging and often wakes her up at bedtime.  Sinus drainage- at our last visit she was started on allergy medications for allergic rhinitis. She continues to have large amount of sinus drainage and now has pressure in the face. She is unable to use the Flonase because it causes burning in her nose. She is taking her Claritin.   Review of Systems    GEN- denies fatigue, fever, weight loss,weakness, recent illness  HEENT- no change in vision, +nasal drainage CVS- denies chest pain, occ palpitations RESP- denies SOB, cough, wheeze ABD- denies  change in stools, + abd pain/side pain MSK- +joint pain-multiple sites    Objective:   Physical Exam  GEN- NAD, alert and oriented x3 HEENT- PERRL, EOMI, non injected sclera, pink conjunctiva, MMM, oropharynx clear, +sinus pressure, rhinorrhea noted  Neck- no LAD CVS- RRR, no murmur RESP-CTAB ABD- NABS, SOFT, NT, ND, no organomegaly Side- RIght side T10  l TTP, no bruising or ecchymosis over rib region       Assessment & Plan:   Pt had mild erythema surrounding nares with itching, secondary to new lotion used Hydrocortione sent in

## 2011-09-20 NOTE — Assessment & Plan Note (Signed)
I will obtain a rib series film. Her pain is not in the abdominal region and CT scan from July negative. Give 30 tablets of Tylenol #3 until I have her imaging back.

## 2011-09-20 NOTE — Assessment & Plan Note (Signed)
Deteriorated, increase to Toprol 50mg 

## 2011-09-20 NOTE — Patient Instructions (Signed)
Continue the prozac Take 2 of the Toprol ( blood pressure pills) I will send a new prescription for the new dose ( 50mg )  Get the x-ray of your side  Continue your lantus at 25units  Get your blood work done  Take the antibiotics for your sinuses Use the cream on your face  F/U in 2 months

## 2011-09-21 MED ORDER — HYDROCORTISONE 1 % EX OINT: TOPICAL_OINTMENT | Freq: Two times a day (BID) | CUTANEOUS | Status: DC

## 2011-09-21 NOTE — Progress Notes (Signed)
Addended by: Milinda Antis F on: 09/21/2011 12:20 PM   Modules accepted: Orders

## 2011-10-06 ENCOUNTER — Other Ambulatory Visit: Payer: Self-pay | Admitting: Family Medicine

## 2011-10-08 ENCOUNTER — Ambulatory Visit (INDEPENDENT_AMBULATORY_CARE_PROVIDER_SITE_OTHER): Payer: Medicaid Other | Admitting: Ophthalmology

## 2011-10-08 DIAGNOSIS — H2510 Age-related nuclear cataract, unspecified eye: Secondary | ICD-10-CM

## 2011-10-08 DIAGNOSIS — E1165 Type 2 diabetes mellitus with hyperglycemia: Secondary | ICD-10-CM

## 2011-10-08 DIAGNOSIS — E11359 Type 2 diabetes mellitus with proliferative diabetic retinopathy without macular edema: Secondary | ICD-10-CM

## 2011-10-08 DIAGNOSIS — H43819 Vitreous degeneration, unspecified eye: Secondary | ICD-10-CM

## 2011-10-12 ENCOUNTER — Other Ambulatory Visit: Payer: Self-pay

## 2011-10-12 MED ORDER — LISINOPRIL 40 MG PO TABS: 40.0000 mg | ORAL_TABLET | Freq: Every day | ORAL | Status: DC

## 2011-10-23 ENCOUNTER — Encounter: Payer: Self-pay | Admitting: Gastroenterology

## 2011-10-24 ENCOUNTER — Encounter: Payer: Self-pay | Admitting: Gastroenterology

## 2011-10-24 ENCOUNTER — Ambulatory Visit (INDEPENDENT_AMBULATORY_CARE_PROVIDER_SITE_OTHER): Payer: Medicaid Other | Admitting: Gastroenterology

## 2011-10-24 VITALS — BP 142/87 | HR 81 | Temp 97.6°F | Ht 62.0 in | Wt 174.8 lb

## 2011-10-24 DIAGNOSIS — K589 Irritable bowel syndrome without diarrhea: Secondary | ICD-10-CM | POA: Insufficient documentation

## 2011-10-24 MED ORDER — DICYCLOMINE HCL 10 MG PO CAPS: ORAL_CAPSULE | ORAL | Status: DC

## 2011-10-24 MED ORDER — POLYETHYLENE GLYCOL 3350 17 GM/SCOOP PO POWD: ORAL | Status: DC

## 2011-10-24 NOTE — Assessment & Plan Note (Signed)
PT HAS INTERMITTENT DIARRHEA AFTER EATING. DOES NOT HAVE A GALLBLADDER. ALSO HAS CONSTIPATION: STRAINING, HARD STOOLS. 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  READ INFO ABOUT IRRITABLE BOWEL SYNDROME. TAKE MIRALAX EVERY MON, WED, & FRI TO AVOID CONSTIPATION. USE BENTYL AS NEEDED FOR DIARRHEA OR ABDOMINAL CRAMPS. YOU MAY TAKE 1 EVERY 4 TO 6 HOURS BUT NOT MORE THAN 8 PER DAY. BENTYL MAY CAUSE DRY MOUTH/EYES, DROWSINESS, OR CONSTIPATION. FOLLOW  A LOW FAT/HIGH FIBER DIET. . FOLLOW UP IN 3 MOS.

## 2011-10-24 NOTE — Progress Notes (Signed)
Faxed to PCP

## 2011-10-24 NOTE — Patient Instructions (Addendum)
READ INFO ABOUT IRRITABLE BOWEL SYNDROME.  TAKE MIRALAX EVERY MON, WED, & FRI TO AVOID DIARRHEA FROM MIRALAX & TO TREAT CONSTIPATION. USE BENTYL AS NEEDED FOR DIARRHEA OR ABDOMINAL CRAMPS. YOU MAY TAKE 1 EVERY 4 TO 6 HOURS BUT NOT MORE THAN 8 PER DAY. BENTYL MAY CAUSE DRY MOUTH/EYES, DROWSINESS, OR CONSTIPATION.  FOLLOW  A LOW FAT/HIGH FIBER DIET. SEE INFO BELOW.  FOLLOW UP IN 3 MOS.  Irritable Bowel Syndrome (Spastic Colon) Irritable Bowel Syndrome (IBS) is caused by a disturbance of normal bowel function. Other terms used are spastic colon, mucous colitis, and irritable colon. It does not require surgery, nor does it lead to cancer. There is no cure for IBS. But with proper diet, stress reduction, and medication, you will find that your problems (symptoms) will gradually disappear or improve. IBS is a common digestive disorder. Women develop it twice as often as men.  CAUSES After food has been digested and absorbed in the small intestine, waste material is moved into the colon (large intestine). In the colon, water and salts are absorbed from the undigested products coming from the small intestine. The remaining residue, or fecal material, is held for elimination. Under normal circumstances, gentle, rhythmic contractions on the bowel walls push the fecal material along the colon towards the rectum. In IBS, however, these contractions are irregular and poorly coordinated. The fecal material is either retained too long, resulting in constipation, or expelled too soon, producing diarrhea.  SYMPTOMS  The most common symptom of IBS is pain. It is typically in the lower left side of the belly (abdomen). But it may occur anywhere in the abdomen. It can be felt as heartburn, backache, or even as a dull pain in the arms or shoulders. The pain comes from excessive bowel-muscle spasms and from the buildup of gas and fecal material in the colon. This pain:  Can range from sharp belly (abdominal) cramps to  a dull, continuous ache.   Usually worsens soon after eating.   Is typically relieved by having a bowel movement or passing gas.  Abdominal pain is usually accompanied by constipation. But it may also produce diarrhea. The diarrhea typically occurs right after a meal or upon arising in the morning. The stools are typically soft and watery. They are often flecked with secretions (mucus).  Other symptoms of IBS include:  Bloating.  Loss of appetite.   Heartburn.  Feeling sick to your stomach  (nausea).   Belching  Vomiting   Gas.  IBS may also cause a number of symptoms that are unrelated to the digestive system:  Fatigue.  Headaches.   Anxiety  Shortness of breath   Difficulty in concentrating.  Dizziness.   These symptoms tend to come and go. TREATMENT  A number of medications are available to help correct bowel function and/or relieve bowel spasms and abdominal pain. Among the drugs available are:  Mild, non-irritating laxatives(MIRALAX) for constipation and to help restore normal bowel habits.   Specific anti-diarrheal medications to treat severe or prolonged diarrhea.   Anti-spasmodic agents to relieve intestinal cramps (Bentyl).   PROBIOTICS   HOME CARE INSTRUCTIONS   Avoid foods that are high in fat or oils. Some examples [id number] cream, butter, frankfurters, sausage, and other fatty meats.   MINIMIZE foods that have a laxative effect, such as fruit, fruit juice, and dairy products.   Cut out carbonated drinks, chewing gum, and "gassy" foods, such as beans and cabbage. This may help relieve bloating and belching.  Bran taken with plenty of liquids may help relieve constipation.   Keep track of what foods seem to trigger your symptoms.   Avoid emotionally charged situations or circumstances that produce anxiety.   Start or continue exercising.   Get plenty of rest and sleep.   Low-Fat Diet BREADS, CEREALS, PASTA, RICE, DRIED PEAS, AND BEANS These  products are high in carbohydrates and most are low in fat. Therefore, they can be increased in the diet as substitutes for fatty foods. They too, however, contain calories and should not be eaten in excess. Cereals can be eaten for snacks as well as for breakfast.  Include foods that contain fiber (fruits, vegetables, whole grains, and legumes). Research shows that fiber may lower blood cholesterol levels, especially the water-soluble fiber found in fruits, vegetables, oat products, and legumes. FRUITS AND VEGETABLES It is good to eat fruits and vegetables. Besides being sources of fiber, both are rich in vitamins and some minerals. They help you get the daily allowances of these nutrients. Fruits and vegetables can be used for snacks and desserts. MEATS Limit lean meat, chicken, [place], and fish to no more than 6 ounces per day. Beef, Pork, and Lamb Use lean cuts of beef, pork, and lamb. Lean cuts include:  Extra-lean ground beef.  Arm roast.  Sirloin tip.  Center-cut ham.  Round steak.  Loin chops.  Rump roast.  Tenderloin.  Trim all fat off the outside of meats before cooking. It is not necessary to severely decrease the intake of red meat, but lean choices should be made. Lean meat is rich in protein and contains a highly absorbable form of iron. Premenopausal women, in particular, should avoid reducing lean red meat because this could increase the risk for low red blood cells (iron-deficiency anemia).  Chicken and [place] These are good sources of protein. The fat of poultry can be reduced by removing the skin and underlying fat layers before cooking. Chicken and [place] can be substituted for lean red meat in the diet. Poultry should not be fried or covered with high-fat sauces. Fish and Shellfish Fish is a good source of protein. Shellfish contain cholesterol, but they usually are low in saturated fatty acids. The preparation of fish is important. Like chicken and [place], they should not  be fried or covered with high-fat sauces. EGGS Egg whites contain no fat or cholesterol. They can be eaten often. Try 1 to 2 egg whites instead of whole eggs in recipes or use egg substitutes that do not contain yolk.  MILK AND DAIRY PRODUCTS Use skim or 1% milk instead of 2% or whole milk. Decrease whole milk, natural, and processed cheeses. Use nonfat or low-fat (2%) cottage cheese or low-fat cheeses made from vegetable oils. Choose nonfat or low-fat (1 to 2%) yogurt. Experiment with evaporated skim milk in recipes that call for heavy cream. Substitute low-fat yogurt or low-fat cottage cheese for sour cream in dips and salad dressings. Have at least 2 servings of low-fat dairy products, such as 2 glasses of skim (or 1%) milk each day to help get your daily calcium intake.  FATS AND OILS Butterfat, lard, and beef fats are high in saturated fat and cholesterol. These should be avoided.Vegetable fats do not contain cholesterol. AVOID coconut oil, palm oil, and palm kernel oil, WHICH are very high in saturated fats. These should be limited. These fats are often used in bakery goods, processed foods, popcorn, oils, and nondairy creamers. Vegetable shortenings and some peanut butters contain hydrogenated  oils, which are also saturated fats. Read the labels on these foods and check for saturated vegetable oils.  Desirable liquid vegetable oils are corn oil, cottonseed oil, olive oil, canola oil, safflower oil, soybean oil, and sunflower oil. Peanut oil is not as good, but small amounts are acceptable. Buy a heart-healthy tub margarine that has no partially hydrogenated oils in the ingredients. AVOID Mayonnaise and salad dressings often are made from unsaturated fats.  OTHER EATING TIPS Snacks  Most sweets should be limited as snacks. They tend to be rich in calories and fats, and their caloric content outweighs their nutritional value. Some good choices in snacks are graham crackers, melba toast, soda  crackers, bagels (no egg), English muffins, fruits, and vegetables. These snacks are preferable to snack crackers, [place] fries, and chips. Popcorn should be air-popped or cooked in small amounts of liquid vegetable oil.  Desserts Eat fruit, low-fat yogurt, and fruit ices instead of pastries, cake, and cookies. Sherbet, angel food cake, gelatin dessert, frozen low-fat yogurt, or other frozen products that do not contain saturated fat (pure fruit juice bars, frozen ice pops) are also acceptable.   COOKING METHODS Choose those methods that use little or no fat. They include: Poaching.  Braising.  Steaming.  Grilling.  Baking.  Stir-frying.  Broiling.  Microwaving.  Foods can be cooked in a nonstick pan without added fat, or use a nonfat cooking spray in regular cookware. Limit fried foods and avoid frying in saturated fat. Add moisture to lean meats by using water, broth, cooking wines, and other nonfat or low-fat sauces along with the cooking methods mentioned above. Soups and stews should be chilled after cooking. The fat that forms on top after a few hours in the refrigerator should be skimmed off. When preparing meals, avoid using excess salt. Salt can contribute to raising blood pressure in some people.  EATING AWAY FROM HOME Order entres, potatoes, and vegetables without sauces or butter. When meat exceeds the size of a deck of cards (3 to 4 ounces), the rest can be taken home for another meal. Choose vegetable or fruit salads and ask for low-calorie salad dressings to be served on the side. Use dressings sparingly. Limit high-fat toppings, such as bacon, crumbled eggs, cheese, sunflower seeds, and olives. Ask for heart-healthy tub margarine instead of butter.  High-Fiber Diet A high-fiber diet changes your normal diet to include more whole grains, legumes, fruits, and vegetables. Changes in the diet involve replacing refined carbohydrates with unrefined foods. The calorie level of the  diet is essentially unchanged. The Dietary Reference Intake (recommended amount) for adult males is 38 grams per day. For adult females, it is 25 grams per day. Pregnant and lactating women should consume 28 grams of fiber per day. Fiber is the intact part of a plant that is not broken down during digestion. Functional fiber is fiber that has been isolated from the plant to provide a beneficial effect in the body. PURPOSE  Increase stool bulk.   Ease and regulate bowel movements.   Lower cholesterol.  INDICATIONS THAT YOU NEED MORE FIBER  Constipation and hemorrhoids.   Uncomplicated diverticulosis (intestine condition) and irritable bowel syndrome.   Weight management.   As a protective measure against hardening of the arteries (atherosclerosis), diabetes, and cancer.   DO NOT USE WITH:  Acute diverticulitis (intestine infection).   Partial small bowel obstructions.   Complicated diverticular disease involving bleeding, rupture (perforation), or abscess (boil, furuncle).   Presence of autonomic neuropathy (nerve  damage) or gastroparesis (stomach cannot empty itself).    GUIDELINES FOR INCREASING FIBER IN THE DIET  Start adding fiber to the diet slowly. A gradual increase of about 5 more grams (2 slices of whole-wheat bread, 2 servings of most fruits or vegetables, or 1 bowl of high-fiber cereal) per day is best. Too rapid an increase in fiber may result in constipation, flatulence, and bloating.   Drink enough water and fluids to keep your urine clear or pale yellow. Water, juice, or caffeine-free drinks are recommended. Not drinking enough fluid may cause constipation.   Eat a variety of high-fiber foods rather than one type of fiber.   Try to increase your intake of fiber through using high-fiber foods rather than fiber pills or supplements that contain small amounts of fiber.   The goal is to change the types of food eaten. Do not supplement your present diet with  high-fiber foods, but replace foods in your present diet.    INCLUDE A VARIETY OF FIBER SOURCES  Replace refined and processed grains with whole grains, canned fruits with fresh fruits, and incorporate other fiber sources. White rice, white breads, and most bakery goods contain little or no fiber.   Brown whole-grain rice, buckwheat oats, and many fruits and vegetables are all good sources of fiber. These include: broccoli, Brussels sprouts, cabbage, cauliflower, beets, sweet potatoes, white potatoes (skin on), carrots, tomatoes, eggplant, squash, berries, fresh fruits, and dried fruits.   Cereals appear to be the richest source of fiber. Cereal fiber is found in whole grains and bran. Bran is the fiber-rich outer coat of cereal grain, which is largely removed in refining. In whole-grain cereals, the bran remains. In breakfast cereals, the largest amount of fiber is found in those with "bran" in their names. The fiber content is sometimes indicated on the label.   You may need to include additional fruits and vegetables each day.   In baking, for 1 cup white flour, you may use the following substitutions:   1 cup whole-wheat flour minus 2 tablespoons.   1/2 cup white flour plus 1/2 cup whole-wheat flour.

## 2011-10-24 NOTE — Progress Notes (Signed)
Subjective:    Patient ID: Kathy Hardy, female    DOB: [birth year], 56 y.o.   MRN: [id number]  PCP:   HPI USING PAIN MEDS ONLY WHEN SHE NEEDS TO. Having burning pain in RUQ and sharp LQs(R > L) Eating popcorn and it helped her bowels. Needs a stool softener. BLQs: Can stay all day. RUQ: if eat grease. Nausea: this AM felt nausea, every AM if she tries to eat early to take meds like she's spose to. Last vomited: last week. Bms: w/o pain meds-every 2-3 days, taking pain meds once a day. Took Miralax every day and had diarrhea. Eats and has diarrhea depends now hat she eats. HAS CRAMPING BEFORE THAT IS BETTER AFTER A BM.  Past Medical History  Diagnosis Date  . Hypertension   . Diabetes mellitus   . Hyperlipidemia   . Anxiety   . 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 Arthritis   . Stroke     TIAx 4,   . DDD (degenerative disc disease)     lumbar  . Insomnia   . Uterine cancer 2000    unknown what type  . Diabetic macular edema     s/p laser    Past Surgical History  Procedure Date  . Right leg surgery x 4 (polio)   . Cholecystectomy     [facility]  . Abdominal hysterectomy     [facility]  . Eye surgery     bilateral  . Colonoscopy 07/09/2011    Internal hemorrhoids/Poor prep in the right colon  . Esophagogastroduodenoscopy 07/09/2011    Mild gastritis   Allergies  Allergen Reactions  . Oxycodone Itching and Other (See Comments)    Does not know  . Percocet (Oxycodone-Acetaminophen) Other (See Comments)    Does not know. May have had a reaction because of an empty stomach     Current Outpatient Prescriptions  Medication Sig Dispense Refill  . Acetaminophen-Codeine 300-30 MG per tablet Take 1-2 tablets by mouth 2 (two) times daily as needed for pain.  30 tablet  0  . ALPRAZolam (XANAX) 0.5 MG tablet Take 1 tablet (0.5 mg total) by mouth 2 (two) times daily as needed for sleep or anxiety.  60 tablet  3  . clopidogrel (PLAVIX) 75 MG tablet Take 1 tablet (75 mg total) by  mouth every morning.  30 tablet  3  . fluticasone (FLONASE) 50 MCG/ACT nasal spray Place 1 spray into the nose daily.  16 g  2  . furosemide (LASIX) 20 MG tablet Take 20 mg by mouth every morning.        . hydrocortisone 1 % ointment Apply topically 2 (two) times daily. Apply to face  30 g  0  . insulin glargine (LANTUS) 100 UNIT/ML injection Inject 22 Units into the skin at bedtime.       Marland Kitchen lisinopril (PRINIVIL,ZESTRIL) 40 MG tablet Take 1 tablet (40 mg total) by mouth daily.  30 tablet  3  . loratadine (CLARITIN) 10 MG tablet Take 1 tablet (10 mg total) by mouth daily.  30 tablet  2  . metoprolol succinate (TOPROL-XL) 50 MG 24 hr tablet Take 1 tablet (50 mg total) by mouth daily.  30 tablet  3  . omeprazole (PRILOSEC) 20 MG capsule Take 1 capsule (20 mg total) by mouth daily.  30 capsule  3  . polyethylene glycol powder (GLYCOLAX/MIRALAX) powder 17 GM PO PRN  255 g  11  . prednisoLONE acetate (PRED FORTE)  1 % ophthalmic suspension 1 drop every morning. Noted in FL2 review      . rosuvastatin (CRESTOR) 10 MG tablet Take 1 tablet (10 mg total) by mouth at bedtime. D/c lipitor  30 tablet  4  . traMADol (ULTRAM) 50 MG tablet Take 1 tablet (50 mg total) by mouth 2 (two) times daily as needed. For pain  30 tablet  1  . UNIFINE PENTIPS 31G X 6 MM MISC AS DIRECTED  100 each  3  .           Review of Systems     Objective:   Physical Exam  Vitals reviewed. Constitutional: She is oriented to person, place, and time. She appears well-nourished. No distress.  HENT:  Head: Normocephalic and atraumatic.  Mouth/Throat: Oropharynx is clear and moist. No oropharyngeal exudate.  Eyes: Pupils are equal, round, and reactive to light.  Neck: Normal range of motion. Neck supple.  Cardiovascular: Normal rate, regular rhythm and normal heart sounds.   Pulmonary/Chest: Effort normal and breath sounds normal. No respiratory distress.  Abdominal: Soft. Bowel sounds are normal. She exhibits no distension.  There is no tenderness.       OBESE   Musculoskeletal: She exhibits no edema.       LROM WALKS WITH A CANE  Lymphadenopathy:    She has no cervical adenopathy.  Neurological: She is alert and oriented to person, place, and time.       NO  NEW FOCAL DEFICITS   Psychiatric:       ANXIOUS MOOD, NL AFFECT          Assessment & Plan:

## 2011-10-25 ENCOUNTER — Ambulatory Visit: Payer: Medicaid Other | Admitting: Gastroenterology

## 2011-10-30 ENCOUNTER — Other Ambulatory Visit: Payer: Self-pay | Admitting: Family Medicine

## 2011-11-01 NOTE — Progress Notes (Signed)
Reminder in epic to follow up in 3 months with SF in E30 

## 2011-11-06 LAB — HEMOGLOBIN A1C: Mean Plasma Glucose: 243 mg/dL — ABNORMAL HIGH (ref <117)

## 2011-11-14 ENCOUNTER — Ambulatory Visit (INDEPENDENT_AMBULATORY_CARE_PROVIDER_SITE_OTHER): Payer: Medicaid Other | Admitting: Family Medicine

## 2011-11-14 ENCOUNTER — Encounter: Payer: Self-pay | Admitting: Family Medicine

## 2011-11-14 DIAGNOSIS — A809 Acute poliomyelitis, unspecified: Secondary | ICD-10-CM

## 2011-11-14 DIAGNOSIS — E119 Type 2 diabetes mellitus without complications: Secondary | ICD-10-CM

## 2011-11-14 DIAGNOSIS — W19XXXA Unspecified fall, initial encounter: Secondary | ICD-10-CM

## 2011-11-14 DIAGNOSIS — J309 Allergic rhinitis, unspecified: Secondary | ICD-10-CM

## 2011-11-14 DIAGNOSIS — R109 Unspecified abdominal pain: Secondary | ICD-10-CM

## 2011-11-14 DIAGNOSIS — M79604 Pain in right leg: Secondary | ICD-10-CM

## 2011-11-14 DIAGNOSIS — M79609 Pain in unspecified limb: Secondary | ICD-10-CM

## 2011-11-14 DIAGNOSIS — E785 Hyperlipidemia, unspecified: Secondary | ICD-10-CM

## 2011-11-14 DIAGNOSIS — B91 Sequelae of poliomyelitis: Secondary | ICD-10-CM

## 2011-11-14 DIAGNOSIS — F411 Generalized anxiety disorder: Secondary | ICD-10-CM

## 2011-11-14 DIAGNOSIS — Z23 Encounter for immunization: Secondary | ICD-10-CM

## 2011-11-14 DIAGNOSIS — M89669 Osteopathy after poliomyelitis, unspecified lower leg: Secondary | ICD-10-CM

## 2011-11-14 MED ORDER — CETIRIZINE HCL 10 MG PO TABS: 10.0000 mg | ORAL_TABLET | Freq: Every day | ORAL | Status: DC

## 2011-11-14 MED ORDER — MOMETASONE FUROATE 50 MCG/ACT NA SUSP: 2.0000 | Freq: Every day | NASAL | Status: DC

## 2011-11-14 MED ORDER — ACETAMINOPHEN-CODEINE 300-30 MG PO TABS: 1.0000 | ORAL_TABLET | Freq: Two times a day (BID) | ORAL | Status: DC | PRN

## 2011-11-14 NOTE — Patient Instructions (Signed)
Continue your Lantus 30 units  Follow-up with the eye doctor Reschedule your podiatry/foot doctor appt Try the new allergy pill Get the x-ray  Watch the spot on your foot  Use the albuterol as needed  F/U 3 months

## 2011-11-15 ENCOUNTER — Encounter: Payer: Self-pay | Admitting: Family Medicine

## 2011-11-15 DIAGNOSIS — M79604 Pain in right leg: Secondary | ICD-10-CM | POA: Insufficient documentation

## 2011-11-15 MED ORDER — ALBUTEROL SULFATE HFA 108 (90 BASE) MCG/ACT IN AERS: 2.0000 | INHALATION_SPRAY | Freq: Four times a day (QID) | RESPIRATORY_TRACT | Status: DC | PRN

## 2011-11-15 NOTE — Assessment & Plan Note (Signed)
Improved, x-ray pending, she thinks she is relying on cane too much on that side

## 2011-11-15 NOTE — Assessment & Plan Note (Signed)
At goal.  

## 2011-11-15 NOTE — Assessment & Plan Note (Signed)
Unchanged, cause of increased sensitivity in right foot

## 2011-11-15 NOTE — Progress Notes (Signed)
  Subjective:    Patient ID: Kathy Hardy, female    DOB: [birth year], 56 y.o.   MRN: [id number]  HPI  Pt here for routine visit, medications and History reviewed  DM- taking insulin as prescribed, Fasting this AM 95, no CBG > 200, no hypolgycemia, +numbness in feet L>R,  Fall- 5 days ago, grabbing a rail outside of apt that was loose, hit her right leg and knee, feels like she pulled a muscle, able to ambulate  Review of Systems  GEN- denies fatigue, fever, weight loss,weakness, recent illness HEENT- denies eye drainage, change in vision, nasal discharge, CVS- denies chest pain, palpitations RESP- denies SOB, cough, wheeze ABD- denies N/V, change in stools, abd pain GU- denies dysuria, hematuria, dribbling, incontinence MSK-+joint pain, +muscle aches, injury Neuro- denies headache, dizziness, syncope, seizure activity       Objective:   Physical Exam GEN- NAD, alert and oriented x3 HEENT- PERRL, EOMI, non injected sclera, pink conjunctiva, MMM, oropharynx clear,  Neck- supple, no LAD, no bruit CVS- RRR, no murmur RESP-CTAB ABD-NABS,soft, NT, ND, Ext- abrasion to left shin Right knee- TTP along shin and inferior knee, no effusion, no ecchymosis, no bony abnormality  decreased ROM (affected polio side) No edema Neuro- hypersensitivy in Right foot, decreased monofilament in left foot       Assessment & Plan:

## 2011-11-15 NOTE — Assessment & Plan Note (Signed)
Exam has mild tenderness, ROM unchanged on side of polio. No knee effusion, refilled Tylenol #3

## 2011-11-15 NOTE — Assessment & Plan Note (Signed)
Improved CBG, lantus 30 units A1C 10%

## 2011-11-15 NOTE — Assessment & Plan Note (Signed)
Improved, continue current meds 

## 2011-11-15 NOTE — Assessment & Plan Note (Signed)
Her allergy meds were not helping, will try nasonex instead and zyrtec

## 2011-11-21 ENCOUNTER — Telehealth: Payer: Self-pay | Admitting: Family Medicine

## 2011-11-22 NOTE — Telephone Encounter (Signed)
fyi

## 2011-12-04 ENCOUNTER — Other Ambulatory Visit: Payer: Self-pay | Admitting: Family Medicine

## 2011-12-07 ENCOUNTER — Ambulatory Visit (HOSPITAL_COMMUNITY)
Admission: RE | Admit: 2011-12-07 | Discharge: 2011-12-07 | Disposition: A | Discharge status: Home / Self Care | Payer: Medicaid Other | Source: Ambulatory Visit | Attending: Family Medicine | Admitting: Family Medicine

## 2011-12-07 ENCOUNTER — Other Ambulatory Visit: Payer: Self-pay | Admitting: Family Medicine

## 2011-12-07 DIAGNOSIS — R079 Chest pain, unspecified: Secondary | ICD-10-CM | POA: Insufficient documentation

## 2011-12-07 DIAGNOSIS — M25569 Pain in unspecified knee: Secondary | ICD-10-CM

## 2011-12-07 DIAGNOSIS — R109 Unspecified abdominal pain: Secondary | ICD-10-CM

## 2011-12-21 ENCOUNTER — Ambulatory Visit (HOSPITAL_COMMUNITY)
Admission: RE | Admit: 2011-12-21 | Discharge: 2011-12-21 | Disposition: A | Discharge status: Home / Self Care | Payer: Medicaid Other | Source: Ambulatory Visit | Attending: Family Medicine | Admitting: Family Medicine

## 2011-12-21 DIAGNOSIS — M25569 Pain in unspecified knee: Secondary | ICD-10-CM

## 2011-12-21 DIAGNOSIS — M899 Disorder of bone, unspecified: Secondary | ICD-10-CM | POA: Insufficient documentation

## 2011-12-25 ENCOUNTER — Telehealth: Payer: Self-pay | Admitting: Family Medicine

## 2011-12-25 DIAGNOSIS — S82409A Unspecified fracture of shaft of unspecified fibula, initial encounter for closed fracture: Secondary | ICD-10-CM

## 2011-12-25 DIAGNOSIS — M25561 Pain in right knee: Secondary | ICD-10-CM

## 2011-12-25 NOTE — Telephone Encounter (Signed)
Pt given results continues to have knee pain, unsure of how low fracture has been present, she had 2 remote falls, will send to ortho for evaluation.

## 2011-12-26 ENCOUNTER — Telehealth: Payer: Self-pay | Admitting: Family Medicine

## 2011-12-26 NOTE — Telephone Encounter (Signed)
Need OV before pain med given, correct?

## 2011-12-27 MED ORDER — ACETAMINOPHEN-CODEINE 300-30 MG PO TABS: 1.0000 | ORAL_TABLET | Freq: Two times a day (BID) | ORAL | Status: DC | PRN

## 2011-12-27 NOTE — Telephone Encounter (Signed)
Patient aware and will come to collect  

## 2011-12-27 NOTE — Telephone Encounter (Signed)
I will refill the Tylenol #3 30 tablets, she needs to be seen by ortho.

## 2012-01-02 ENCOUNTER — Other Ambulatory Visit: Payer: Self-pay | Admitting: Family Medicine

## 2012-01-03 ENCOUNTER — Encounter: Payer: Self-pay | Admitting: Orthopedic Surgery

## 2012-01-03 ENCOUNTER — Ambulatory Visit (INDEPENDENT_AMBULATORY_CARE_PROVIDER_SITE_OTHER): Payer: Medicaid Other | Admitting: Orthopedic Surgery

## 2012-01-03 VITALS — BP 116/68 | Ht 62.0 in | Wt 174.0 lb

## 2012-01-03 DIAGNOSIS — M549 Dorsalgia, unspecified: Secondary | ICD-10-CM

## 2012-01-03 DIAGNOSIS — M5410 Radiculopathy, site unspecified: Secondary | ICD-10-CM | POA: Insufficient documentation

## 2012-01-03 DIAGNOSIS — IMO0002 Reserved for concepts with insufficient information to code with codable children: Secondary | ICD-10-CM

## 2012-01-03 DIAGNOSIS — G8929 Other chronic pain: Secondary | ICD-10-CM

## 2012-01-03 NOTE — Patient Instructions (Signed)
Start therapy Call office if symptoms do not improve after PT

## 2012-01-03 NOTE — Progress Notes (Signed)
  Subjective:    Kathy Hardy is a 56 y.o. female who presents with a knee injury involving the right knee. Onset was sudden, related to a fall from standing. Mechanism of injury: contusion and fall. Inciting event: injured while GOING UP A STAIRCASE AND THE RAILING WAS LOOSE CAUSING HER TO SLIP. Current symptoms include: giving out and RADICULAR PAIN BELOW THE KNEE INTO THE FOOT . Pain is aggravated by inactivity and AND LYING DOWN . Patient has had no prior knee problems. Evaluation to date: plain films: abnormal FIBULAR HEAD FRACTURE . Treatment to date: rest and PAIN MEDICATION .  SHE HAS H/O DDD L SPINE WITHOUT TREATMENT EXCEPT 1 ESI > 5 YEARS AGO WITH TEMPORARY RELIEF. MRI WAS DONE 2005 AND PLAIN FILM MORE RECENTLY SHOWS ANTEROLISTHESIS LOWER L SPINE   The following portions of the patient's history were reviewed and updated as appropriate: allergies, current medications, past family history, past medical history, past social history, past surgical history and problem list.    She reports positive review of systems as blurred vision eye pain and watering of the eyes. She reports occasional shortness of breath and wheezing as well as diarrhea constipation nausea vomiting joint pain and swelling stiffness muscle pain numbness tingling dizziness anxiety depression easy bruising temperature intolerance and seasonal allergies  She was evaluated for lumbar pain and told there was nothing that could be done her MRI shows disc disease at L5-S1 and a recent x-ray shows anterolisthesis of the lumbar spine. She'll apparently has not been adequately treated for this.  Review of Systems Pertinent items are noted in HPI.   Objective:    BP 116/68  Ht 5\' 2"  (1.575 m)  Wt 174 lb (78.926 kg)  BMI 31.83 kg/m2 Vital signs are stable as recorded  General appearance is normal  The patient is alert and oriented x3  The patient's mood and affect are normal  Gait assessment: Supported by pain and  poor gait pattern The cardiovascular exam reveals normal pulses and temperature without edema swelling.  The lymphatic system is negative for palpable lymph nodes  The sensory exam is normal.  There are no pathologic reflexes.  Balance is normal.   Exam of the chest some tenderness over the fibula. But this does not reproduce her pain neither does ranging her knee or her hip shows a mildly positive straight leg raise at 45. Hip and knee range of motion is normal joints are stable muscle tone in the right lower extremity is normal skin is intact   Assessment:    Right Proximal fibular fracture consistent with direct blow  She also has radicular pain in her right leg from just disease in her lumbar spine and will need further treatment    Plan:    The knee does not need any fracture treatment at this time but she is sent for physical therapy. If she does not improve we would recommend an MRI and epidural steroid injections in a that doesn't work then neurosurgical consult

## 2012-01-29 ENCOUNTER — Encounter: Payer: Self-pay | Admitting: Gastroenterology

## 2012-02-11 ENCOUNTER — Other Ambulatory Visit: Payer: Self-pay

## 2012-02-11 MED ORDER — ROSUVASTATIN CALCIUM 10 MG PO TABS: 10.0000 mg | ORAL_TABLET | Freq: Every day | ORAL | Status: DC

## 2012-02-13 ENCOUNTER — Ambulatory Visit: Payer: Medicaid Other | Admitting: Family Medicine

## 2012-02-26 ENCOUNTER — Ambulatory Visit (INDEPENDENT_AMBULATORY_CARE_PROVIDER_SITE_OTHER): Payer: Medicaid Other | Admitting: Family Medicine

## 2012-02-26 ENCOUNTER — Encounter: Payer: Self-pay | Admitting: Family Medicine

## 2012-02-26 VITALS — BP 172/98 | HR 90 | Resp 18 | Ht 62.0 in | Wt 177.0 lb

## 2012-02-26 DIAGNOSIS — E1140 Type 2 diabetes mellitus with diabetic neuropathy, unspecified: Secondary | ICD-10-CM

## 2012-02-26 DIAGNOSIS — E1142 Type 2 diabetes mellitus with diabetic polyneuropathy: Secondary | ICD-10-CM

## 2012-02-26 DIAGNOSIS — E119 Type 2 diabetes mellitus without complications: Secondary | ICD-10-CM

## 2012-02-26 DIAGNOSIS — M89669 Osteopathy after poliomyelitis, unspecified lower leg: Secondary | ICD-10-CM

## 2012-02-26 DIAGNOSIS — R269 Unspecified abnormalities of gait and mobility: Secondary | ICD-10-CM

## 2012-02-26 DIAGNOSIS — B91 Sequelae of poliomyelitis: Secondary | ICD-10-CM

## 2012-02-26 DIAGNOSIS — G8929 Other chronic pain: Secondary | ICD-10-CM

## 2012-02-26 DIAGNOSIS — I10 Essential (primary) hypertension: Secondary | ICD-10-CM

## 2012-02-26 DIAGNOSIS — M549 Dorsalgia, unspecified: Secondary | ICD-10-CM

## 2012-02-26 DIAGNOSIS — A809 Acute poliomyelitis, unspecified: Secondary | ICD-10-CM

## 2012-02-26 DIAGNOSIS — E1149 Type 2 diabetes mellitus with other diabetic neurological complication: Secondary | ICD-10-CM

## 2012-02-26 DIAGNOSIS — E785 Hyperlipidemia, unspecified: Secondary | ICD-10-CM

## 2012-02-26 MED ORDER — GABAPENTIN 300 MG PO CAPS: 300.0000 mg | ORAL_CAPSULE | Freq: Every day | ORAL | Status: DC

## 2012-02-26 MED ORDER — METOPROLOL SUCCINATE ER 100 MG PO TB24: 100.0000 mg | ORAL_TABLET | Freq: Every day | ORAL | Status: DC

## 2012-02-26 NOTE — Patient Instructions (Addendum)
Get the labs done  MRI to be done for back  Start the neurontin at night  For your blood pressure- Take 2 of the 50mg  tablets for your blood pressure, then start the new dose  F/U in 2 weeks- Monday the 12th

## 2012-02-26 NOTE — Progress Notes (Signed)
  Subjective:    Patient ID: Kathy Hardy, female    DOB: 1955-08-22, 56 y.o.   MRN: [id number]  HPI Patient here to follow chronic medical problems. She's concerned about her pain today. She was seen by orthopedic surgery for right tibial fracture And ongoing leg pain. This was thought to be an old wound that was healing. He was concerned about her back pain and spondylolisthesis and recommended physical therapy however patient did not follow through with this. She continues to have back and leg pain orthopedics also recommended MRI. She's using Tylenol #3 as well as tramadol as needed. She's requesting something stronger. Diabetes mellitus blood sugars fasting 73-120's and in the evening her blood sugars have been 150s. She's taking Lantus 30 units denies hypoglycemia denies polyuria, polydipsia she is watching her sugar intake but then admits to eating snack food.. she complains of numbness and tingling in her feet as well as her hands. Hypertension she states her blood pressure has been normal at home she has [facility] as well as social services following her. She is taking her medications as prescribed however she did not bring them with her today. She is planned to have surgery on her right eye for her macular edema.  Review of Systems   GEN- denies fatigue, fever, weight loss,weakness, recent illness HEENT- denies eye drainage, change in vision, nasal discharge, CVS- denies chest pain, palpitations RESP- denies SOB, cough, wheeze ABD- denies N/V, change in stools, abd pain GU- denies dysuria, hematuria, dribbling, incontinence MSK-+joint pain, +muscle aches, injury Neuro- denies headache, dizziness, syncope, seizure activity    Objective:   Physical Exam GEN- NAD, alert and oriented x3 HEENT- PERRL, EOMI, non injected sclera, pink conjunctiva, MMM, oropharynx clear,  Neck- supple,  CVS- RRR, no murmur RESP-CTAB ABD-NABS,soft, NT, ND, Ext- No edema Neuro-  hypersensitivy in Right foot, decreased monofilament bilat feet       Assessment & Plan:

## 2012-02-28 DIAGNOSIS — E1140 Type 2 diabetes mellitus with diabetic neuropathy, unspecified: Secondary | ICD-10-CM | POA: Insufficient documentation

## 2012-02-28 NOTE — Assessment & Plan Note (Signed)
LDL at goal 1 year ago, direct LDL to be done, continue statin

## 2012-02-28 NOTE — Assessment & Plan Note (Signed)
MRI of back to be done with radicular symptoms and polio

## 2012-02-28 NOTE — Assessment & Plan Note (Addendum)
Uncontrolled, increase metoprolol to 100mg 

## 2012-02-28 NOTE — Assessment & Plan Note (Signed)
unchanged

## 2012-02-28 NOTE — Assessment & Plan Note (Signed)
Unchanged, uses cane

## 2012-02-28 NOTE — Assessment & Plan Note (Signed)
Start neurontin

## 2012-02-28 NOTE — Assessment & Plan Note (Addendum)
DM uncontrolled, A1C to be obtained, if no imporvement will refer to endocrine A1C unchanged at 10.8%, Concern about compliance, she does not bring in meds and often does not get labs done on time. Referral to endocrine.

## 2012-03-04 ENCOUNTER — Other Ambulatory Visit: Payer: Self-pay | Admitting: Family Medicine

## 2012-03-05 LAB — CBC
MCV: 92.7 fL (ref 78.0–100.0)
Platelets: 228 10*3/uL (ref 150–400)
RBC: 4.63 MIL/uL (ref 3.87–5.11)
WBC: 4.4 10*3/uL (ref 4.0–10.5)

## 2012-03-05 LAB — HEMOGLOBIN A1C: Hgb A1c MFr Bld: 10.8 % — ABNORMAL HIGH (ref <5.7)

## 2012-03-05 LAB — BASIC METABOLIC PANEL
BUN: 9 mg/dL (ref 6–23)
Calcium: 9.4 mg/dL (ref 8.4–10.5)
Potassium: 4.5 mEq/L (ref 3.5–5.3)

## 2012-03-05 LAB — LDL CHOLESTEROL, DIRECT: Direct LDL: 84 mg/dL

## 2012-03-06 ENCOUNTER — Other Ambulatory Visit: Payer: Self-pay | Admitting: Family Medicine

## 2012-03-06 ENCOUNTER — Telehealth: Payer: Self-pay | Admitting: Family Medicine

## 2012-03-06 NOTE — Addendum Note (Signed)
Addended by: Milinda Antis F on: 03/06/2012 08:12 AM   Modules accepted: Orders

## 2012-03-07 ENCOUNTER — Other Ambulatory Visit: Payer: Self-pay

## 2012-03-07 NOTE — Telephone Encounter (Signed)
This was verbally called into the pharmacy

## 2012-03-10 ENCOUNTER — Encounter: Payer: Self-pay | Admitting: Family Medicine

## 2012-03-10 ENCOUNTER — Ambulatory Visit (INDEPENDENT_AMBULATORY_CARE_PROVIDER_SITE_OTHER): Payer: Medicaid Other | Admitting: Family Medicine

## 2012-03-10 VITALS — BP 140/98 | HR 78 | Resp 18 | Ht 62.0 in | Wt 182.0 lb

## 2012-03-10 DIAGNOSIS — M549 Dorsalgia, unspecified: Secondary | ICD-10-CM

## 2012-03-10 DIAGNOSIS — I10 Essential (primary) hypertension: Secondary | ICD-10-CM

## 2012-03-10 DIAGNOSIS — G8929 Other chronic pain: Secondary | ICD-10-CM

## 2012-03-10 DIAGNOSIS — E119 Type 2 diabetes mellitus without complications: Secondary | ICD-10-CM

## 2012-03-10 MED ORDER — HYDROCODONE-ACETAMINOPHEN 5-500 MG PO TABS: 1.0000 | ORAL_TABLET | Freq: Two times a day (BID) | ORAL | Status: DC | PRN

## 2012-03-10 NOTE — Assessment & Plan Note (Signed)
Uncontrolled DM, referral to endocrine pending

## 2012-03-10 NOTE — Assessment & Plan Note (Signed)
BP improved with increase in Toprol, goal , 130/80 systolic, will continue current meds She is also on diuretic, may need CCB

## 2012-03-10 NOTE — Progress Notes (Signed)
  Subjective:    Patient ID: Kathy Hardy, female    DOB: 1955/12/13, 56 y.o.   MRN: [id number]  HPI   Pt here for intermin visit for blood pressure and labs , MRI of back to be done, asking for pain meds because she has severe pain in back often unable to move around or sleep at night because of pain. Toprol increased to 100mg  at last visit  Review of Systems   GEN- denies fatigue, fever, weight loss,weakness, recent illness CVS- denies chest pain, palpitations RESP- denies SOB, cough, wheeze GU- denies dysuria, hematuria, dribbling, incontinence MSK- + joint pain, muscle aches, injury       Objective:   Physical Exam GEN- NAD, alert and oriented x3 CVS- RRR, no murmur RESP-CTAB EXT- No edema         Assessment & Plan:

## 2012-03-10 NOTE — Assessment & Plan Note (Signed)
MRI pending,given limited supply of vicodin until visit

## 2012-03-10 NOTE — Patient Instructions (Signed)
Get the MRI next Monday 20 tablets of vicodin given until I review MRI  Continue blood pressure medication this is much better Referral to diabetes specialist- Dr. Fransico Him  F/U 3 months

## 2012-03-12 ENCOUNTER — Ambulatory Visit (HOSPITAL_COMMUNITY): Payer: Medicaid Other

## 2012-03-17 ENCOUNTER — Ambulatory Visit (HOSPITAL_COMMUNITY)
Admission: RE | Admit: 2012-03-17 | Discharge: 2012-03-17 | Disposition: A | Discharge status: Home / Self Care | Payer: Medicaid Other | Source: Ambulatory Visit | Attending: Family Medicine | Admitting: Family Medicine

## 2012-03-17 DIAGNOSIS — M545 Low back pain, unspecified: Secondary | ICD-10-CM | POA: Insufficient documentation

## 2012-03-17 DIAGNOSIS — M79609 Pain in unspecified limb: Secondary | ICD-10-CM | POA: Insufficient documentation

## 2012-03-17 DIAGNOSIS — R209 Unspecified disturbances of skin sensation: Secondary | ICD-10-CM | POA: Insufficient documentation

## 2012-03-17 DIAGNOSIS — B91 Sequelae of poliomyelitis: Secondary | ICD-10-CM

## 2012-03-17 DIAGNOSIS — G8929 Other chronic pain: Secondary | ICD-10-CM

## 2012-03-18 ENCOUNTER — Other Ambulatory Visit: Payer: Self-pay | Admitting: Family Medicine

## 2012-03-18 ENCOUNTER — Telehealth: Payer: Self-pay

## 2012-03-18 ENCOUNTER — Telehealth: Payer: Self-pay | Admitting: Family Medicine

## 2012-03-18 DIAGNOSIS — G8929 Other chronic pain: Secondary | ICD-10-CM

## 2012-03-18 DIAGNOSIS — M549 Dorsalgia, unspecified: Secondary | ICD-10-CM

## 2012-03-18 DIAGNOSIS — Z8612 Personal history of poliomyelitis: Secondary | ICD-10-CM

## 2012-03-18 MED ORDER — HYDROCODONE-ACETAMINOPHEN 5-500 MG PO TABS: 1.0000 | ORAL_TABLET | Freq: Two times a day (BID) | ORAL | Status: DC | PRN

## 2012-03-18 NOTE — Telephone Encounter (Signed)
Her phone has been disconnected and Kathy Hardy and I was just able to get in touch with her regarding the referral to [facility]. She states she is going to need some more pain meds to help the pain until she can get in to see pain management

## 2012-03-18 NOTE — Telephone Encounter (Signed)
I will give only 1 more refill on meds

## 2012-03-21 NOTE — Telephone Encounter (Signed)
Patient is aware 

## 2012-03-28 ENCOUNTER — Telehealth: Payer: Self-pay | Admitting: Family Medicine

## 2012-03-28 MED ORDER — HYDROCODONE-ACETAMINOPHEN 5-500 MG PO TABS: 1.0000 | ORAL_TABLET | Freq: Two times a day (BID) | ORAL | Status: DC | PRN

## 2012-03-28 NOTE — Telephone Encounter (Signed)
I will allow one more refill. She has been referred to pain clinic, please let her know I will not fill again

## 2012-03-28 NOTE — Telephone Encounter (Signed)
In to pick up rx

## 2012-04-07 ENCOUNTER — Telehealth: Payer: Self-pay | Admitting: Family Medicine

## 2012-04-08 ENCOUNTER — Other Ambulatory Visit: Payer: Self-pay

## 2012-04-08 MED ORDER — DICYCLOMINE HCL 10 MG PO CAPS: ORAL_CAPSULE | ORAL | Status: DC

## 2012-04-08 NOTE — Telephone Encounter (Signed)
Please clarify this message with the actual office, I am not sure why a retroactive referral would be needed ??

## 2012-04-09 ENCOUNTER — Ambulatory Visit (INDEPENDENT_AMBULATORY_CARE_PROVIDER_SITE_OTHER): Payer: Medicaid Other | Admitting: Ophthalmology

## 2012-04-14 ENCOUNTER — Other Ambulatory Visit: Payer: Self-pay | Admitting: Family Medicine

## 2012-04-14 ENCOUNTER — Telehealth: Payer: Self-pay | Admitting: Family Medicine

## 2012-04-14 DIAGNOSIS — Z139 Encounter for screening, unspecified: Secondary | ICD-10-CM

## 2012-04-14 MED ORDER — HYDROCODONE-ACETAMINOPHEN 5-500 MG PO TABS: 1.0000 | ORAL_TABLET | Freq: Two times a day (BID) | ORAL | Status: DC | PRN

## 2012-04-14 NOTE — Telephone Encounter (Signed)
She has been referred to Dr. Eduard Clos for pain management. This will be the last refill on medications.

## 2012-04-14 NOTE — Telephone Encounter (Signed)
Tried to call pt back to let her know she can get med but phone was out of order.

## 2012-04-14 NOTE — Telephone Encounter (Signed)
Are you still prescribing pain meds for this patient?

## 2012-04-14 NOTE — Telephone Encounter (Signed)
Tried to call back again. Phone out of order. Med is ready to be collected, This is the last RX for pain from Dr Jeanice Lim, Has been referred to pain management

## 2012-04-15 ENCOUNTER — Other Ambulatory Visit: Payer: Self-pay | Admitting: Family Medicine

## 2012-05-05 ENCOUNTER — Ambulatory Visit (INDEPENDENT_AMBULATORY_CARE_PROVIDER_SITE_OTHER): Payer: Medicaid Other | Admitting: Ophthalmology

## 2012-05-05 ENCOUNTER — Other Ambulatory Visit: Payer: Self-pay | Admitting: Family Medicine

## 2012-05-07 ENCOUNTER — Other Ambulatory Visit: Payer: Self-pay

## 2012-05-07 MED ORDER — CETIRIZINE HCL 10 MG PO TABS: 10.0000 mg | ORAL_TABLET | Freq: Every day | ORAL | Status: DC

## 2012-05-23 ENCOUNTER — Telehealth: Payer: Self-pay | Admitting: Family Medicine

## 2012-05-23 NOTE — Telephone Encounter (Signed)
She can call the podiatrist and see if they have all of the orders, I have not seen anything recently, but It could have been signed a few weeks ago

## 2012-05-23 NOTE — Telephone Encounter (Signed)
Have you seen the paper she is referring to?

## 2012-05-26 ENCOUNTER — Ambulatory Visit (HOSPITAL_COMMUNITY): Payer: Medicaid Other

## 2012-05-30 ENCOUNTER — Telehealth: Payer: Self-pay | Admitting: Family Medicine

## 2012-05-30 NOTE — Telephone Encounter (Signed)
She is just getting a screening mammogram, pt was to call and set up THe order was placed for screening mammogram in Sept, I am not sure what the confusion is about

## 2012-06-04 ENCOUNTER — Other Ambulatory Visit: Payer: Self-pay

## 2012-06-04 MED ORDER — FLUOXETINE HCL 20 MG PO CAPS: ORAL_CAPSULE | ORAL | Status: DC

## 2012-06-04 MED ORDER — CLOPIDOGREL BISULFATE 75 MG PO TABS: ORAL_TABLET | ORAL | Status: DC

## 2012-06-05 ENCOUNTER — Other Ambulatory Visit: Payer: Self-pay

## 2012-06-05 MED ORDER — ALPRAZOLAM 0.5 MG PO TABS: 0.5000 mg | ORAL_TABLET | Freq: Every evening | ORAL | Status: DC | PRN

## 2012-06-06 ENCOUNTER — Telehealth: Payer: Self-pay | Admitting: Family Medicine

## 2012-06-06 NOTE — Telephone Encounter (Signed)
Attempted to call patient x 2.  Phone is off and goes straight to voicemail which is not set up.  Xanax sent in on 11/7.  Hydrocodone will not be refilled per Dr. Jeanice Lim.

## 2012-06-10 ENCOUNTER — Ambulatory Visit: Payer: Medicaid Other | Admitting: Family Medicine

## 2012-06-11 ENCOUNTER — Encounter (INDEPENDENT_AMBULATORY_CARE_PROVIDER_SITE_OTHER): Payer: Medicaid Other | Admitting: Ophthalmology

## 2012-06-11 DIAGNOSIS — H3340 Traction detachment of retina, unspecified eye: Secondary | ICD-10-CM | POA: Diagnosis present

## 2012-06-11 DIAGNOSIS — H4310 Vitreous hemorrhage, unspecified eye: Secondary | ICD-10-CM

## 2012-06-11 DIAGNOSIS — H43819 Vitreous degeneration, unspecified eye: Secondary | ICD-10-CM

## 2012-06-11 DIAGNOSIS — E1139 Type 2 diabetes mellitus with other diabetic ophthalmic complication: Secondary | ICD-10-CM

## 2012-06-11 DIAGNOSIS — H35039 Hypertensive retinopathy, unspecified eye: Secondary | ICD-10-CM

## 2012-06-11 DIAGNOSIS — E11359 Type 2 diabetes mellitus with proliferative diabetic retinopathy without macular edema: Secondary | ICD-10-CM

## 2012-06-11 DIAGNOSIS — E113599 Type 2 diabetes mellitus with proliferative diabetic retinopathy without macular edema, unspecified eye: Secondary | ICD-10-CM | POA: Diagnosis present

## 2012-06-11 DIAGNOSIS — I10 Essential (primary) hypertension: Secondary | ICD-10-CM

## 2012-06-11 NOTE — H&P (Signed)
Kathy Hardy is an 56 y.o. female.   Chief Complaint: loss of vision after cataract surgery right eye HPI: longstanding diabetes and hypertension  Past Medical History  Diagnosis Date  . Hypertension   . Diabetes mellitus   . Hyperlipidemia   . Anxiety   . 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 Arthritis   . Stroke     TIAx 4,   . DDD (degenerative disc disease)     lumbar  . Insomnia   . Uterine cancer 2000    unknown what type  . Diabetic macular edema     s/p laser    Past Surgical History  Procedure Date  . Right leg surgery x 4 (polio)   . Cholecystectomy     [facility]  . Abdominal hysterectomy     [facility]  . Eye surgery     bilateral  . Colonoscopy 07/09/2011    Internal hemorrhoids/Poor prep in the right colon  . Esophagogastroduodenoscopy 07/09/2011    Mild gastritis    Family History  Problem Relation Age of Onset  . Diabetes Mother   . Hypertension Mother   . Diabetes Sister   . Cancer Maternal Grandmother     type?  Marland Kitchen Anesthesia problems Neg Hx   . Hypotension Neg Hx   . Malignant hyperthermia Neg Hx   . Pseudochol deficiency Neg Hx    Social History:  reports that she has never smoked. She does not have any smokeless tobacco history on file. She reports that she does not drink alcohol or use illicit drugs.  Allergies:  Allergies  Allergen Reactions  . Oxycodone Itching and Other (See Comments)    Does not know  . Percocet (Oxycodone-Acetaminophen) Other (See Comments)    Does not know. May have had a reaction because of an empty stomach     No prescriptions prior to admission    Review of systems otherwise negative  There were no vitals taken for this visit.  Physical exam: Mental status: oriented x3. Eyes: See eye exam associated with this date of surgery in media tab.  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  Right eye  Sherrie George 06/11/2012, 11:50 AM

## 2012-06-16 ENCOUNTER — Encounter (HOSPITAL_COMMUNITY): Payer: Self-pay | Admitting: Pharmacy Technician

## 2012-06-19 ENCOUNTER — Ambulatory Visit (INDEPENDENT_AMBULATORY_CARE_PROVIDER_SITE_OTHER): Payer: Medicaid Other | Admitting: Family Medicine

## 2012-06-19 ENCOUNTER — Encounter: Payer: Self-pay | Admitting: Family Medicine

## 2012-06-19 VITALS — BP 154/80 | HR 78 | Resp 18 | Ht 62.0 in | Wt 174.0 lb

## 2012-06-19 DIAGNOSIS — E1149 Type 2 diabetes mellitus with other diabetic neurological complication: Secondary | ICD-10-CM

## 2012-06-19 DIAGNOSIS — I10 Essential (primary) hypertension: Secondary | ICD-10-CM

## 2012-06-19 DIAGNOSIS — E1140 Type 2 diabetes mellitus with diabetic neuropathy, unspecified: Secondary | ICD-10-CM

## 2012-06-19 DIAGNOSIS — E785 Hyperlipidemia, unspecified: Secondary | ICD-10-CM

## 2012-06-19 DIAGNOSIS — E1142 Type 2 diabetes mellitus with diabetic polyneuropathy: Secondary | ICD-10-CM

## 2012-06-19 DIAGNOSIS — E119 Type 2 diabetes mellitus without complications: Secondary | ICD-10-CM

## 2012-06-19 DIAGNOSIS — G8929 Other chronic pain: Secondary | ICD-10-CM

## 2012-06-19 DIAGNOSIS — M549 Dorsalgia, unspecified: Secondary | ICD-10-CM

## 2012-06-19 MED ORDER — TRAMADOL HCL 50 MG PO TABS: 50.0000 mg | ORAL_TABLET | Freq: Three times a day (TID) | ORAL | Status: DC | PRN

## 2012-06-19 MED ORDER — AMLODIPINE BESYLATE 5 MG PO TABS: 5.0000 mg | ORAL_TABLET | Freq: Every day | ORAL | Status: DC

## 2012-06-19 NOTE — Patient Instructions (Addendum)
Increase Lantus to 40 units  New blood pressure pill- continue metoprolol and lisinopril Increase neurontin to 300mg  three times a day  Letter for your apartment will be mailed We will f/u on the foot doctor Referral again to pain doctor and diabetes doctor  Tramadol for pain F/U 4 weeks for blood pressure - BRING ALL MEDICATIONS AND DIABETES METER AND LANCET DEVICE

## 2012-06-22 ENCOUNTER — Encounter: Payer: Self-pay | Admitting: Family Medicine

## 2012-06-22 NOTE — Assessment & Plan Note (Signed)
Increase neurontin to TID

## 2012-06-22 NOTE — Progress Notes (Signed)
  Subjective:    Patient ID: Kathy Hardy, female    DOB: 1955/09/01, 56 y.o.   MRN: [id number]  HPI Pt here to f/u DM and htn, she does not have meter with her, or medications, states she has not been able to take CBG because she is missing some device for her lancets?? Taking lantus 35 units, missed endocrine appt, due to have eye surgery on right side, needs clearance Missed pain clinic appt - wants more pain meds Concerned about mold and mildew in new apartment wants a letter stating this is a health concern and to see if she can move apartments States she had mammogram   Review of Systems   GEN- denies fatigue, fever, weight loss,weakness, recent illness HEENT- denies eye drainage, change in vision, nasal discharge, CVS- denies chest pain, palpitations RESP- denies SOB, cough, wheeze ABD- denies N/V, change in stools, abd pain GU- denies dysuria, hematuria, dribbling, incontinence MSK- denies joint pain, muscle aches, injury Neuro- denies headache, dizziness, syncope, seizure activity      Objective:   Physical Exam  GEN- NAD, alert and oriented x3, walks with cane , + weight loss HEENT- PERRL, EOMI, non injected sclera, pink conjunctiva, MMM, oropharynx clear CVS- RRR, no murmur RESP-CTAB EXT- No edema Pulses- Radial, DP- 2+        Assessment & Plan:

## 2012-06-22 NOTE — Assessment & Plan Note (Signed)
Uncontrolled with retinopathy and neuropathy  Will reschedule endocrine appt, she needs labs that she repeat A1C Discussed her non compliance with labs and meds makes it very difficult to treat her

## 2012-06-22 NOTE — Assessment & Plan Note (Signed)
MRI unremarkable, discussed I will not continue her on chronic meds, hopefully there will be some other modalities for her MSK pain Ultram prescribed

## 2012-06-22 NOTE — Assessment & Plan Note (Addendum)
Uncontrolled, No meds with her, I have titrated but very unsure what she is actually taking, add norvasc 5mg  Reiterated getting labs done bringing meds to all visits  Regaring upcoming surgery she has no cardiac risk factors, neg stress testing/myoview in 2012. Her risk will be uncontrolled DM, high risk for poor healing and infection

## 2012-06-23 LAB — LIPID PANEL
Cholesterol: 245 mg/dL — ABNORMAL HIGH (ref 0–200)
HDL: 52 mg/dL (ref >39)
Total CHOL/HDL Ratio: 4.7 Ratio
VLDL: 22 mg/dL (ref 0–40)

## 2012-06-23 LAB — BASIC METABOLIC PANEL
BUN: 10 mg/dL (ref 6–23)
CO2: 34 mEq/L — ABNORMAL HIGH (ref 19–32)
Chloride: 99 mEq/L (ref 96–112)
Creat: 0.64 mg/dL (ref 0.50–1.10)
Glucose, Bld: 252 mg/dL — ABNORMAL HIGH (ref 70–99)

## 2012-06-23 MED ORDER — ROSUVASTATIN CALCIUM 10 MG PO TABS: 10.0000 mg | ORAL_TABLET | Freq: Every day | ORAL | Status: DC

## 2012-06-23 MED ORDER — ROSUVASTATIN CALCIUM 20 MG PO TABS: 20.0000 mg | ORAL_TABLET | Freq: Every day | ORAL | Status: DC

## 2012-06-23 NOTE — Addendum Note (Signed)
Addended by: Milinda Antis F on: 06/23/2012 10:36 PM   Modules accepted: Orders

## 2012-06-25 ENCOUNTER — Telehealth: Payer: Self-pay | Admitting: Family Medicine

## 2012-06-25 NOTE — Telephone Encounter (Signed)
noted 

## 2012-06-25 NOTE — Telephone Encounter (Signed)
Please try Dr. Gerilyn Pilgrim

## 2012-06-25 NOTE — Telephone Encounter (Signed)
Faxed papers to Dr. Harriett Sine office

## 2012-06-30 ENCOUNTER — Encounter (HOSPITAL_COMMUNITY): Payer: Self-pay | Admitting: *Deleted

## 2012-06-30 MED ORDER — MUPIROCIN 2 % EX OINT
TOPICAL_OINTMENT | Freq: Two times a day (BID) | CUTANEOUS | Status: DC
  Administered 2012-07-01: 1 via NASAL
  Filled 2012-06-30: qty 22

## 2012-06-30 MED ORDER — GATIFLOXACIN 0.5 % OP SOLN
1.0000 [drp] | OPHTHALMIC | Status: AC | PRN
  Administered 2012-07-01 (×3): 1 [drp] via OPHTHALMIC
  Filled 2012-06-30: qty 2.5

## 2012-06-30 MED ORDER — TROPICAMIDE 1 % OP SOLN
1.0000 [drp] | OPHTHALMIC | Status: AC | PRN
  Administered 2012-07-01 (×3): 1 [drp] via OPHTHALMIC
  Filled 2012-06-30: qty 3

## 2012-06-30 MED ORDER — CEFAZOLIN SODIUM-DEXTROSE 2-3 GM-% IV SOLR
2.0000 g | INTRAVENOUS | Status: AC
  Administered 2012-07-01: 2 g via INTRAVENOUS
  Filled 2012-06-30: qty 50

## 2012-06-30 MED ORDER — PHENYLEPHRINE HCL 2.5 % OP SOLN
1.0000 [drp] | OPHTHALMIC | Status: AC | PRN
  Administered 2012-07-01 (×3): 1 [drp] via OPHTHALMIC
  Filled 2012-06-30: qty 3

## 2012-06-30 MED ORDER — CYCLOPENTOLATE HCL 1 % OP SOLN
1.0000 [drp] | OPHTHALMIC | Status: AC | PRN
  Administered 2012-07-01 (×3): 1 [drp] via OPHTHALMIC
  Filled 2012-06-30: qty 2

## 2012-07-01 ENCOUNTER — Encounter (HOSPITAL_COMMUNITY)
Admission: RE | Disposition: A | Discharge status: Home / Self Care | Payer: Self-pay | Source: Ambulatory Visit | Attending: Ophthalmology

## 2012-07-01 ENCOUNTER — Encounter (HOSPITAL_COMMUNITY): Payer: Self-pay | Admitting: *Deleted

## 2012-07-01 ENCOUNTER — Encounter (HOSPITAL_COMMUNITY): Payer: Self-pay | Admitting: Critical Care Medicine

## 2012-07-01 ENCOUNTER — Ambulatory Visit (HOSPITAL_COMMUNITY): Payer: Medicaid Other | Admitting: Critical Care Medicine

## 2012-07-01 ENCOUNTER — Encounter (HOSPITAL_COMMUNITY): Payer: Self-pay | Admitting: General Practice

## 2012-07-01 ENCOUNTER — Ambulatory Visit (HOSPITAL_COMMUNITY): Payer: Medicaid Other

## 2012-07-01 ENCOUNTER — Ambulatory Visit (HOSPITAL_COMMUNITY)
Admission: RE | Admit: 2012-07-01 | Discharge: 2012-07-02 | Disposition: A | Discharge status: Home / Self Care | Payer: Medicaid Other | Source: Ambulatory Visit | Attending: Ophthalmology | Admitting: Ophthalmology

## 2012-07-01 DIAGNOSIS — R29898 Other symptoms and signs involving the musculoskeletal system: Secondary | ICD-10-CM | POA: Insufficient documentation

## 2012-07-01 DIAGNOSIS — E1165 Type 2 diabetes mellitus with hyperglycemia: Secondary | ICD-10-CM

## 2012-07-01 DIAGNOSIS — E1139 Type 2 diabetes mellitus with other diabetic ophthalmic complication: Secondary | ICD-10-CM | POA: Insufficient documentation

## 2012-07-01 DIAGNOSIS — E11359 Type 2 diabetes mellitus with proliferative diabetic retinopathy without macular edema: Secondary | ICD-10-CM | POA: Insufficient documentation

## 2012-07-01 DIAGNOSIS — I69998 Other sequelae following unspecified cerebrovascular disease: Secondary | ICD-10-CM | POA: Insufficient documentation

## 2012-07-01 DIAGNOSIS — E113599 Type 2 diabetes mellitus with proliferative diabetic retinopathy without macular edema, unspecified eye: Secondary | ICD-10-CM | POA: Diagnosis present

## 2012-07-01 DIAGNOSIS — H3340 Traction detachment of retina, unspecified eye: Secondary | ICD-10-CM

## 2012-07-01 DIAGNOSIS — I10 Essential (primary) hypertension: Secondary | ICD-10-CM | POA: Insufficient documentation

## 2012-07-01 DIAGNOSIS — Z794 Long term (current) use of insulin: Secondary | ICD-10-CM | POA: Insufficient documentation

## 2012-07-01 DIAGNOSIS — H4310 Vitreous hemorrhage, unspecified eye: Secondary | ICD-10-CM

## 2012-07-01 DIAGNOSIS — J45909 Unspecified asthma, uncomplicated: Secondary | ICD-10-CM | POA: Insufficient documentation

## 2012-07-01 DIAGNOSIS — K219 Gastro-esophageal reflux disease without esophagitis: Secondary | ICD-10-CM | POA: Insufficient documentation

## 2012-07-01 DIAGNOSIS — Z23 Encounter for immunization: Secondary | ICD-10-CM

## 2012-07-01 HISTORY — PX: RETINAL DETACHMENT SURGERY: SHX105

## 2012-07-01 HISTORY — DX: Polyneuropathy, unspecified: G62.9

## 2012-07-01 HISTORY — DX: Gastro-esophageal reflux disease without esophagitis: K21.9

## 2012-07-01 HISTORY — DX: Unspecified asthma, uncomplicated: J45.909

## 2012-07-01 HISTORY — PX: PARS PLANA VITRECTOMY: SHX2166

## 2012-07-01 HISTORY — DX: Peripheral vascular disease, unspecified: I73.9

## 2012-07-01 LAB — BASIC METABOLIC PANEL
BUN: 14 mg/dL (ref 6–23)
Calcium: 9.4 mg/dL (ref 8.4–10.5)
Creatinine, Ser: 0.76 mg/dL (ref 0.50–1.10)
GFR calc Af Amer: >90 mL/min (ref >90)
GFR calc non Af Amer: >90 mL/min (ref >90)

## 2012-07-01 LAB — GLUCOSE, CAPILLARY
Glucose-Capillary: 207 mg/dL — ABNORMAL HIGH (ref 70–99)
Glucose-Capillary: 358 mg/dL — ABNORMAL HIGH (ref 70–99)

## 2012-07-01 LAB — CBC
HCT: 37.6 % (ref 36.0–46.0)
MCHC: 33.2 g/dL (ref 30.0–36.0)
Platelets: 176 10*3/uL (ref 150–400)
RDW: 13.5 % (ref 11.5–15.5)

## 2012-07-01 SURGERY — PARS PLANA VITRECTOMY WITH 25 GAUGE
Anesthesia: General | Site: Eye | Laterality: Right | Wound class: Clean

## 2012-07-01 MED ORDER — MORPHINE SULFATE 2 MG/ML IJ SOLN
1.0000 mg | INTRAMUSCULAR | Status: DC | PRN
  Administered 2012-07-01: 2 mg via INTRAVENOUS
  Filled 2012-07-01: qty 1

## 2012-07-01 MED ORDER — ATORVASTATIN CALCIUM 10 MG PO TABS
10.0000 mg | ORAL_TABLET | Freq: Every day | ORAL | Status: DC
  Administered 2012-07-01: 10 mg via ORAL
  Filled 2012-07-01 (×2): qty 1

## 2012-07-01 MED ORDER — BSS IO SOLN
INTRAOCULAR | Status: AC
  Filled 2012-07-01: qty 15

## 2012-07-01 MED ORDER — BUPIVACAINE HCL (PF) 0.75 % IJ SOLN
INTRAMUSCULAR | Status: DC | PRN
  Administered 2012-07-01: 10 mL

## 2012-07-01 MED ORDER — DOCUSATE SODIUM 100 MG PO CAPS
100.0000 mg | ORAL_CAPSULE | Freq: Two times a day (BID) | ORAL | Status: DC
  Administered 2012-07-01 (×2): 100 mg via ORAL
  Filled 2012-07-01 (×2): qty 1

## 2012-07-01 MED ORDER — 0.9 % SODIUM CHLORIDE (POUR BTL) OPTIME
TOPICAL | Status: DC | PRN
  Administered 2012-07-01: 1000 mL

## 2012-07-01 MED ORDER — TETRACAINE HCL 0.5 % OP SOLN
2.0000 [drp] | Freq: Once | OPHTHALMIC | Status: DC
  Filled 2012-07-01: qty 2

## 2012-07-01 MED ORDER — BACITRACIN-POLYMYXIN B 500-10000 UNIT/GM OP OINT
1.0000 "application " | TOPICAL_OINTMENT | Freq: Four times a day (QID) | OPHTHALMIC | Status: DC
  Filled 2012-07-01: qty 3.5

## 2012-07-01 MED ORDER — HYDROCODONE-ACETAMINOPHEN 5-325 MG PO TABS: 1.0000 | ORAL_TABLET | ORAL | Status: DC | PRN

## 2012-07-01 MED ORDER — DEXAMETHASONE SODIUM PHOSPHATE 10 MG/ML IJ SOLN
INTRAMUSCULAR | Status: DC | PRN
  Administered 2012-07-01: 10 mg via INTRAVENOUS

## 2012-07-01 MED ORDER — ATROPINE SULFATE 1 % OP SOLN
OPHTHALMIC | Status: DC | PRN
  Administered 2012-07-01: 2 [drp] via OPHTHALMIC

## 2012-07-01 MED ORDER — BUPIVACAINE HCL (PF) 0.75 % IJ SOLN
INTRAMUSCULAR | Status: AC
  Filled 2012-07-01: qty 10

## 2012-07-01 MED ORDER — HYDROMORPHONE HCL PF 1 MG/ML IJ SOLN
0.2500 mg | INTRAMUSCULAR | Status: DC | PRN
  Administered 2012-07-01 (×2): 0.5 mg via INTRAVENOUS

## 2012-07-01 MED ORDER — ACETAMINOPHEN 325 MG PO TABS: 325.0000 mg | ORAL_TABLET | ORAL | Status: DC | PRN

## 2012-07-01 MED ORDER — METOPROLOL SUCCINATE ER 100 MG PO TB24
100.0000 mg | ORAL_TABLET | Freq: Every day | ORAL | Status: DC
  Administered 2012-07-01: 100 mg via ORAL
  Filled 2012-07-01 (×2): qty 1

## 2012-07-01 MED ORDER — EPINEPHRINE HCL 1 MG/ML IJ SOLN
INTRAMUSCULAR | Status: AC
  Filled 2012-07-01: qty 1

## 2012-07-01 MED ORDER — TEMAZEPAM 15 MG PO CAPS: 15.0000 mg | ORAL_CAPSULE | Freq: Every evening | ORAL | Status: DC | PRN

## 2012-07-01 MED ORDER — HEMOSTATIC AGENTS (NO CHARGE) OPTIME
TOPICAL | Status: DC | PRN
  Administered 2012-07-01: 1 via TOPICAL

## 2012-07-01 MED ORDER — SODIUM CHLORIDE 0.45 % IV SOLN
INTRAVENOUS | Status: DC
  Administered 2012-07-01: 20 mL/h via INTRAVENOUS

## 2012-07-01 MED ORDER — POLYMYXIN B SULFATE 500000 UNITS IJ SOLR
INTRAMUSCULAR | Status: DC | PRN
  Administered 2012-07-01: 11:00:00

## 2012-07-01 MED ORDER — BRIMONIDINE TARTRATE 0.2 % OP SOLN
1.0000 [drp] | Freq: Two times a day (BID) | OPHTHALMIC | Status: DC
  Filled 2012-07-01: qty 5

## 2012-07-01 MED ORDER — ATROPINE SULFATE 1 % OP SOLN
OPHTHALMIC | Status: AC
  Filled 2012-07-01: qty 2

## 2012-07-01 MED ORDER — MIDAZOLAM HCL 5 MG/5ML IJ SOLN
INTRAMUSCULAR | Status: DC | PRN
  Administered 2012-07-01: 2 mg via INTRAVENOUS

## 2012-07-01 MED ORDER — EPINEPHRINE HCL 1 MG/ML IJ SOLN
INTRAOCULAR | Status: DC | PRN
  Administered 2012-07-01: 11:00:00

## 2012-07-01 MED ORDER — GABAPENTIN 300 MG PO CAPS
300.0000 mg | ORAL_CAPSULE | Freq: Every day | ORAL | Status: DC
  Administered 2012-07-01: 300 mg via ORAL
  Filled 2012-07-01 (×2): qty 1

## 2012-07-01 MED ORDER — FLUTICASONE PROPIONATE 50 MCG/ACT NA SUSP
1.0000 | Freq: Every day | NASAL | Status: DC
  Administered 2012-07-01: 1 via NASAL
  Filled 2012-07-01: qty 16

## 2012-07-01 MED ORDER — FENTANYL CITRATE 0.05 MG/ML IJ SOLN
INTRAMUSCULAR | Status: DC | PRN
  Administered 2012-07-01: 100 ug via INTRAVENOUS

## 2012-07-01 MED ORDER — PROPOFOL 10 MG/ML IV BOLUS
INTRAVENOUS | Status: DC | PRN
  Administered 2012-07-01: 120 mg via INTRAVENOUS

## 2012-07-01 MED ORDER — ACETAMINOPHEN 10 MG/ML IV SOLN: 1000.0000 mg | Freq: Once | INTRAVENOUS | Status: DC | PRN

## 2012-07-01 MED ORDER — ACETAZOLAMIDE SODIUM 500 MG IJ SOLR
500.0000 mg | Freq: Once | INTRAMUSCULAR | Status: AC
  Administered 2012-07-02: 500 mg via INTRAVENOUS
  Filled 2012-07-01: qty 500

## 2012-07-01 MED ORDER — SODIUM CHLORIDE 0.9 % IV SOLN
INTRAVENOUS | Status: DC | PRN
  Administered 2012-07-01: 11:00:00 via INTRAVENOUS

## 2012-07-01 MED ORDER — FLUOXETINE HCL 10 MG PO CAPS
10.0000 mg | ORAL_CAPSULE | Freq: Every day | ORAL | Status: DC
  Administered 2012-07-01: 10 mg via ORAL
  Filled 2012-07-01 (×2): qty 1

## 2012-07-01 MED ORDER — METOPROLOL SUCCINATE ER 100 MG PO TB24
100.0000 mg | ORAL_TABLET | Freq: Every day | ORAL | Status: DC
  Administered 2012-07-01: 100 mg via ORAL
  Filled 2012-07-01: qty 1

## 2012-07-01 MED ORDER — LIDOCAINE HCL (CARDIAC) 20 MG/ML IV SOLN
INTRAVENOUS | Status: DC | PRN
  Administered 2012-07-01: 20 mg via INTRAVENOUS

## 2012-07-01 MED ORDER — ROCURONIUM BROMIDE 100 MG/10ML IV SOLN
INTRAVENOUS | Status: DC | PRN
  Administered 2012-07-01: 40 mg via INTRAVENOUS

## 2012-07-01 MED ORDER — HYALURONIDASE HUMAN 150 UNIT/ML IJ SOLN
INTRAMUSCULAR | Status: AC
  Filled 2012-07-01: qty 1

## 2012-07-01 MED ORDER — TRAMADOL HCL 50 MG PO TABS: 50.0000 mg | ORAL_TABLET | Freq: Three times a day (TID) | ORAL | Status: DC | PRN

## 2012-07-01 MED ORDER — ALBUTEROL SULFATE HFA 108 (90 BASE) MCG/ACT IN AERS
2.0000 | INHALATION_SPRAY | Freq: Four times a day (QID) | RESPIRATORY_TRACT | Status: DC | PRN
  Filled 2012-07-01: qty 6.7

## 2012-07-01 MED ORDER — ONDANSETRON HCL 4 MG/2ML IJ SOLN
4.0000 mg | Freq: Four times a day (QID) | INTRAMUSCULAR | Status: DC | PRN
  Administered 2012-07-01: 4 mg via INTRAVENOUS
  Filled 2012-07-01: qty 2

## 2012-07-01 MED ORDER — GLYCOPYRROLATE 0.2 MG/ML IJ SOLN
INTRAMUSCULAR | Status: DC | PRN
  Administered 2012-07-01: 0.4 mg via INTRAVENOUS

## 2012-07-01 MED ORDER — EPHEDRINE SULFATE 50 MG/ML IJ SOLN
INTRAMUSCULAR | Status: DC | PRN
  Administered 2012-07-01 (×2): 10 mg via INTRAVENOUS

## 2012-07-01 MED ORDER — HYDROMORPHONE HCL PF 1 MG/ML IJ SOLN
INTRAMUSCULAR | Status: AC
  Filled 2012-07-01: qty 1

## 2012-07-01 MED ORDER — ATORVASTATIN CALCIUM 10 MG PO TABS: 10.0000 mg | ORAL_TABLET | Freq: Every day | ORAL | Status: DC

## 2012-07-01 MED ORDER — ACETAZOLAMIDE SODIUM 500 MG IJ SOLR
INTRAMUSCULAR | Status: AC
  Filled 2012-07-01: qty 500

## 2012-07-01 MED ORDER — ALPRAZOLAM 0.5 MG PO TABS: 0.5000 mg | ORAL_TABLET | Freq: Every evening | ORAL | Status: DC | PRN

## 2012-07-01 MED ORDER — MINERAL OIL LIGHT 100 % EX OIL
TOPICAL_OIL | CUTANEOUS | Status: AC
  Filled 2012-07-01: qty 25

## 2012-07-01 MED ORDER — INSULIN ASPART 100 UNIT/ML ~~LOC~~ SOLN
0.0000 [IU] | SUBCUTANEOUS | Status: DC
  Administered 2012-07-01: 15 [IU] via SUBCUTANEOUS
  Administered 2012-07-01: 8 [IU] via SUBCUTANEOUS
  Administered 2012-07-02: 3 [IU] via SUBCUTANEOUS

## 2012-07-01 MED ORDER — LIDOCAINE HCL 2 % IJ SOLN
INTRAMUSCULAR | Status: AC
  Filled 2012-07-01: qty 20

## 2012-07-01 MED ORDER — MAGNESIUM HYDROXIDE 400 MG/5ML PO SUSP: 15.0000 mL | Freq: Four times a day (QID) | ORAL | Status: DC | PRN

## 2012-07-01 MED ORDER — INSULIN GLARGINE 100 UNIT/ML ~~LOC~~ SOLN
40.0000 [IU] | Freq: Every day | SUBCUTANEOUS | Status: DC
  Administered 2012-07-01: 40 [IU] via SUBCUTANEOUS

## 2012-07-01 MED ORDER — STERILE WATER FOR IRRIGATION IR SOLN
Status: DC | PRN
  Administered 2012-07-01: 1000 mL

## 2012-07-01 MED ORDER — PANTOPRAZOLE SODIUM 40 MG PO TBEC
40.0000 mg | DELAYED_RELEASE_TABLET | Freq: Every day | ORAL | Status: DC
  Administered 2012-07-01: 40 mg via ORAL
  Filled 2012-07-01: qty 1

## 2012-07-01 MED ORDER — AMLODIPINE BESYLATE 5 MG PO TABS
5.0000 mg | ORAL_TABLET | Freq: Every day | ORAL | Status: DC
  Administered 2012-07-01: 5 mg via ORAL
  Filled 2012-07-01 (×2): qty 1

## 2012-07-01 MED ORDER — BACITRACIN-POLYMYXIN B 500-10000 UNIT/GM OP OINT
TOPICAL_OINTMENT | OPHTHALMIC | Status: DC | PRN
  Administered 2012-07-01: 1 via OPHTHALMIC

## 2012-07-01 MED ORDER — LORATADINE 10 MG PO TABS
10.0000 mg | ORAL_TABLET | Freq: Every day | ORAL | Status: DC
  Administered 2012-07-01: 10 mg via ORAL
  Filled 2012-07-01 (×2): qty 1

## 2012-07-01 MED ORDER — GENTAMICIN SULFATE 40 MG/ML IJ SOLN
INTRAMUSCULAR | Status: AC
  Filled 2012-07-01: qty 2

## 2012-07-01 MED ORDER — PREDNISOLONE ACETATE 1 % OP SUSP
1.0000 [drp] | Freq: Four times a day (QID) | OPHTHALMIC | Status: DC
  Filled 2012-07-01: qty 5
  Filled 2012-07-01: qty 1

## 2012-07-01 MED ORDER — HYPROMELLOSE (GONIOSCOPIC) 2.5 % OP SOLN
OPHTHALMIC | Status: AC
  Filled 2012-07-01: qty 15

## 2012-07-01 MED ORDER — POLYMYXIN B SULFATE 500000 UNITS IJ SOLR
INTRAMUSCULAR | Status: AC
  Filled 2012-07-01: qty 1

## 2012-07-01 MED ORDER — GATIFLOXACIN 0.5 % OP SOLN
1.0000 [drp] | Freq: Four times a day (QID) | OPHTHALMIC | Status: DC
  Filled 2012-07-01: qty 2.5

## 2012-07-01 MED ORDER — INSULIN GLARGINE 100 UNIT/ML ~~LOC~~ SOLN: 5.0000 [IU] | Freq: Every day | SUBCUTANEOUS | Status: DC

## 2012-07-01 MED ORDER — DEXAMETHASONE SODIUM PHOSPHATE 10 MG/ML IJ SOLN
INTRAMUSCULAR | Status: AC
  Filled 2012-07-01: qty 1

## 2012-07-01 MED ORDER — NEOSTIGMINE METHYLSULFATE 1 MG/ML IJ SOLN
INTRAMUSCULAR | Status: DC | PRN
  Administered 2012-07-01: 3 mg via INTRAVENOUS

## 2012-07-01 MED ORDER — FUROSEMIDE 20 MG PO TABS
20.0000 mg | ORAL_TABLET | Freq: Every day | ORAL | Status: DC
  Administered 2012-07-01: 20 mg via ORAL
  Filled 2012-07-01 (×2): qty 1

## 2012-07-01 MED ORDER — LATANOPROST 0.005 % OP SOLN
1.0000 [drp] | Freq: Every day | OPHTHALMIC | Status: DC
  Filled 2012-07-01: qty 2.5

## 2012-07-01 MED ORDER — DICYCLOMINE HCL 10 MG PO CAPS
10.0000 mg | ORAL_CAPSULE | Freq: Three times a day (TID) | ORAL | Status: DC
  Filled 2012-07-01 (×5): qty 1

## 2012-07-01 MED ORDER — ONDANSETRON HCL 4 MG/2ML IJ SOLN
INTRAMUSCULAR | Status: DC | PRN
  Administered 2012-07-01: 4 mg via INTRAVENOUS

## 2012-07-01 MED ORDER — BSS PLUS IO SOLN
INTRAOCULAR | Status: AC
  Filled 2012-07-01: qty 500

## 2012-07-01 MED ORDER — LISINOPRIL 40 MG PO TABS
40.0000 mg | ORAL_TABLET | Freq: Every day | ORAL | Status: DC
  Administered 2012-07-01: 40 mg via ORAL
  Filled 2012-07-01 (×2): qty 1

## 2012-07-01 MED ORDER — SODIUM HYALURONATE 10 MG/ML IO SOLN
INTRAOCULAR | Status: DC | PRN
  Administered 2012-07-01: 0.85 mL via INTRAOCULAR

## 2012-07-01 MED ORDER — BACITRACIN-POLYMYXIN B 500-10000 UNIT/GM OP OINT
TOPICAL_OINTMENT | OPHTHALMIC | Status: AC
  Filled 2012-07-01: qty 3.5

## 2012-07-01 MED ORDER — ONDANSETRON HCL 4 MG/2ML IJ SOLN: 4.0000 mg | Freq: Once | INTRAMUSCULAR | Status: DC | PRN

## 2012-07-01 MED ORDER — SODIUM HYALURONATE 10 MG/ML IO SOLN
INTRAOCULAR | Status: AC
  Filled 2012-07-01: qty 0.85

## 2012-07-01 SURGICAL SUPPLY — 65 items
APPLICATOR DR MATTHEWS STRL (MISCELLANEOUS) IMPLANT
BLADE EYE CATARACT 19 1.4 BEAV (BLADE) IMPLANT
BLADE MVR KNIFE 19G (BLADE) IMPLANT
BLADE MVR KNIFE 20G (BLADE) IMPLANT
CANNULA DUAL BORE 23G (CANNULA) IMPLANT
CANNULA FLEX TIP 25G (CANNULA) ×2 IMPLANT
CLOTH BEACON ORANGE TIMEOUT ST (SAFETY) ×2 IMPLANT
CORDS BIPOLAR (ELECTRODE) ×2 IMPLANT
COTTONBALL LRG STERILE PKG (GAUZE/BANDAGES/DRESSINGS) ×6 IMPLANT
DRAPE INCISE 51X51 W/FILM STRL (DRAPES) ×2 IMPLANT
DRAPE OPHTHALMIC 77X100 STRL (CUSTOM PROCEDURE TRAY) ×2 IMPLANT
FILTER BLUE MILLIPORE (MISCELLANEOUS) IMPLANT
FILTER STRAW FLUID ASPIR (MISCELLANEOUS) IMPLANT
FORCEPS ECKARDT ILM 25G SERR (OPHTHALMIC RELATED) ×2 IMPLANT
GLOVE SS BIOGEL STRL SZ 6.5 (GLOVE) ×1 IMPLANT
GLOVE SS BIOGEL STRL SZ 7 (GLOVE) ×1 IMPLANT
GLOVE SUPERSENSE BIOGEL SZ 6.5 (GLOVE) ×1
GLOVE SUPERSENSE BIOGEL SZ 7 (GLOVE) ×1
GLOVE SURG 8.5 LATEX PF (GLOVE) ×2 IMPLANT
GLOVE SURG SS PI 6.5 STRL IVOR (GLOVE) ×2 IMPLANT
GOWN STRL NON-REIN LRG LVL3 (GOWN DISPOSABLE) ×6 IMPLANT
ILLUMINATOR CHOW PICK 25GA (MISCELLANEOUS) ×2 IMPLANT
KIT BASIN OR (CUSTOM PROCEDURE TRAY) ×2 IMPLANT
KIT ROOM TURNOVER OR (KITS) ×2 IMPLANT
KNIFE CRESCENT 2.5 55 ANG (BLADE) IMPLANT
LENS BIOM SUPER VIEW SET DISP (OPHTHALMIC RELATED) IMPLANT
MARKER SKIN DUAL TIP RULER LAB (MISCELLANEOUS) IMPLANT
MASK EYE SHIELD (GAUZE/BANDAGES/DRESSINGS) ×2 IMPLANT
MICROPICK 25G (MISCELLANEOUS)
NEEDLE 18GX1X1/2 (RX/OR ONLY) (NEEDLE) ×2 IMPLANT
NEEDLE 25GX 5/8IN NON SAFETY (NEEDLE) IMPLANT
NEEDLE 27GAX1X1/2 (NEEDLE) IMPLANT
NEEDLE FILTER BLUNT 18X 1/2SAF (NEEDLE) ×1
NEEDLE FILTER BLUNT 18X1 1/2 (NEEDLE) ×1 IMPLANT
NEEDLE HYPO 30X.5 LL (NEEDLE) ×2 IMPLANT
NS IRRIG 1000ML POUR BTL (IV SOLUTION) ×2 IMPLANT
PACK VITRECTOMY CUSTOM (CUSTOM PROCEDURE TRAY) ×2 IMPLANT
PAD ARMBOARD 7.5X6 YLW CONV (MISCELLANEOUS) ×2 IMPLANT
PAD EYE OVAL STERILE LF (GAUZE/BANDAGES/DRESSINGS) ×2 IMPLANT
PAK VITRECTOMY PIK 25 GA (OPHTHALMIC RELATED) ×2 IMPLANT
PENCIL BIPOLAR 25GA STR DISP (OPHTHALMIC RELATED) ×2 IMPLANT
PICK MICROPICK 25G (MISCELLANEOUS) IMPLANT
PROBE DIRECTIONAL LASER (MISCELLANEOUS) ×2 IMPLANT
REPL STRA BRUSH NEEDLE (NEEDLE) IMPLANT
RESERVOIR BACK FLUSH (MISCELLANEOUS) IMPLANT
ROLLS DENTAL (MISCELLANEOUS) ×4 IMPLANT
SCRAPER DIAMOND DUST MEMBRANE (MISCELLANEOUS) IMPLANT
SPONGE SURGIFOAM ABS GEL 12-7 (HEMOSTASIS) ×2 IMPLANT
STOPCOCK 4 WAY LG BORE MALE ST (IV SETS) IMPLANT
SUT CHROMIC 7 0 TG140 8 (SUTURE) IMPLANT
SUT ETHILON 10 0 CS140 6 (SUTURE) IMPLANT
SUT ETHILON 9 0 TG140 8 (SUTURE) IMPLANT
SUT POLY NON ABSORB 10-0 8 STR (SUTURE) IMPLANT
SUT SILK 4 0 RB 1 (SUTURE) IMPLANT
SYR 20CC LL (SYRINGE) ×2 IMPLANT
SYR 5ML LL (SYRINGE) IMPLANT
SYR BULB 3OZ (MISCELLANEOUS) ×2 IMPLANT
SYR TB 1ML LUER SLIP (SYRINGE) ×2 IMPLANT
SYRINGE 10CC LL (SYRINGE) IMPLANT
TAPE SURG TRANSPORE 1 IN (GAUZE/BANDAGES/DRESSINGS) ×1 IMPLANT
TAPE SURGICAL TRANSPORE 1 IN (GAUZE/BANDAGES/DRESSINGS) ×1
TOWEL OR 17X24 6PK STRL BLUE (TOWEL DISPOSABLE) ×6 IMPLANT
TROCAR CANNULA 25GA (CANNULA) IMPLANT
WATER STERILE IRR 1000ML POUR (IV SOLUTION) ×2 IMPLANT
WIPE INSTRUMENT VISIWIPE 73X73 (MISCELLANEOUS) ×2 IMPLANT

## 2012-07-01 NOTE — Brief Op Note (Signed)
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 Laser and gas.  Membrane peel  Surgeon:  Sherrie George, MD...  Assistant:  Rosalie Doctor SA   Anesthesia: General  Specimen: none  Estimated blood loss:  1cc  Complications: none  Patient sent to PACU in good condition  Composed by Sherrie George MD  Dictation number: [phone]

## 2012-07-01 NOTE — Anesthesia Preprocedure Evaluation (Addendum)
Anesthesia Evaluation  Patient identified by MRN, date of birth, ID band Patient awake    Reviewed: Allergy & Precautions, H&P , NPO status , Patient's Chart, lab work & pertinent test results, reviewed documented beta blocker date and time   Airway Mallampati: II      Dental  (+) Dental Advisory Given   Pulmonary asthma ,  breath sounds clear to auscultation        Cardiovascular hypertension, Pt. on home beta blockers Rhythm:Regular Rate:Normal     Neuro/Psych PSYCHIATRIC DISORDERS Anxiety CVA (left sided weakness), Residual Symptoms    GI/Hepatic GERD-  Medicated,  Endo/Other  diabetes, Insulin Dependent  Renal/GU      Musculoskeletal   Abdominal   Peds  Hematology   Anesthesia Other Findings   Reproductive/Obstetrics                         Anesthesia Physical Anesthesia Plan  ASA: III  Anesthesia Plan: General   Post-op Pain Management:    Induction: Intravenous  Airway Management Planned: Oral ETT  Additional Equipment:   Intra-op Plan:   Post-operative Plan: Extubation in OR  Informed Consent: I have reviewed the patients History and Physical, chart, labs and discussed the procedure including the risks, benefits and alternatives for the proposed anesthesia with the patient or authorized representative who has indicated his/her understanding and acceptance.   Dental advisory given  Plan Discussed with: Anesthesiologist, Surgeon and CRNA  Anesthesia Plan Comments: (Diabetic proliferative retinopathy with detachment Right eye Type 2 DM glucose 207 Htn Asthma mild GERD H/O Polio Diabetic neuropathy  Plan GA with oral ETT)       Anesthesia Quick Evaluation

## 2012-07-01 NOTE — Preoperative (Signed)
Beta Blockers   Reason not to administer Beta Blockers:Not Applicable, pt took 12/3 @1028 

## 2012-07-01 NOTE — Anesthesia Postprocedure Evaluation (Signed)
  Anesthesia Post-op Note  Patient: Kathy Hardy  Procedure(s) Performed: Procedure(s) (LRB) with comments: PARS PLANA VITRECTOMY WITH 25 GAUGE (Right) - with Laser treatment, Membrane peel, Gas Injection and Repair of Traction Retinal Detachment RIGHT eye  Patient Location: PACU  Anesthesia Type:General  Level of Consciousness: awake, alert  and oriented  Airway and Oxygen Therapy: Patient Spontanous Breathing  Post-op Pain: mild  Post-op Assessment: Post-op Vital signs reviewed and Patient's Cardiovascular Status Stable  Post-op Vital Signs: stable  Complications: No apparent anesthesia complications

## 2012-07-01 NOTE — Anesthesia Procedure Notes (Signed)
Procedure Name: Intubation Date/Time: 07/01/2012 11:36 AM Performed by: Elon Alas Pre-anesthesia Checklist: Timeout performed, Patient identified, Emergency Drugs available, Suction available and Patient being monitored Patient Re-evaluated:Patient Re-evaluated prior to inductionOxygen Delivery Method: Circle system utilized Preoxygenation: Pre-oxygenation with 100% oxygen Intubation Type: IV induction Ventilation: Mask ventilation without difficulty Laryngoscope Size: Mac and 3 Grade View: Grade I Tube type: Oral Tube size: 7.0 mm Number of attempts: 1 Airway Equipment and Method: Stylet Placement Confirmation: ETT inserted through vocal cords under direct vision,  positive ETCO2 and breath sounds checked- equal and bilateral Secured at: 21 cm Tube secured with: Tape Dental Injury: Teeth and Oropharynx as per pre-operative assessment

## 2012-07-01 NOTE — Transfer of Care (Signed)
Immediate Anesthesia Transfer of Care Note  Patient: Kathy Hardy  Procedure(s) Performed: Procedure(s) (LRB) with comments: PARS PLANA VITRECTOMY WITH 25 GAUGE (Right)  Patient Location: PACU  Anesthesia Type:General  Level of Consciousness: awake, alert  and oriented  Airway & Oxygen Therapy: Patient Spontanous Breathing and Patient connected to nasal cannula oxygen  Post-op Assessment: Report given to PACU RN, Post -op Vital signs reviewed and stable and Patient moving all extremities X 4  Post vital signs: Reviewed and stable  Complications: No apparent anesthesia complications

## 2012-07-01 NOTE — Consult Note (Signed)
Anesthesia Note:  Patient for right eye vitrectomy today by Dr. Ashley Royalty.  She is a same day work-up.  OR is ready for patient now.   History includes non-smoker, HTN, HLD, DM on insulin, Polio, CVA, GERD, asthma.  PCP is Dr. Milinda Antis who gave medical clearance for this procedure.    EKG today showed NSR, new right BBB.  She had nuclear stress test on 05/04/11 that showed: Negative pharmacologic stress nuclear myocardial study revealing no stress induced EKG abnormalities, normal left ventricular size, normal left ventricular systolic function and normal myocardial perfusion.  EF 66%.  Cardiac cath on 08/12/02 showed normal coronaries.  She reported an episode of what felt like "heartburn" last week.  She has since seen her PCP (on 06/25/12) who cleared her for this procedure.  She ambulates with a walker, but says her apartment is on the second floor.  She has not gotten any chest pain when walking up to her apartment.  She does have chronic DOE.  Exam show her heart with a RRR, lungs clear, no LE edema.  CXR today showed no active disease. Mild degenerative changes thoracic spine.   CBC noted.  BMET pending.  As above, OR is ready for patient.  She will be evaluated by her Anesthesiologist in the Holding area.  Shonna Chock, PA-C 07/01/12 1028

## 2012-07-01 NOTE — H&P (Signed)
I examined the patient today and there is no change in the medical status 

## 2012-07-01 NOTE — Progress Notes (Signed)
ARRIVED TO ROOM 6N#20 FROM PACU, A/OX4, MOVED SELF FROM STRETCHER TO BED WITHOUT DIFFICULTY, ORIENTED TO ROOM AND SURROUNDINGS, VISITOR AT BEDSIDE

## 2012-07-01 NOTE — Progress Notes (Signed)
07/01/12 1035  OBSTRUCTIVE SLEEP APNEA  Have you ever been diagnosed with sleep apnea through a sleep study? Yes  Score 4 or greater  Results sent to PCP

## 2012-07-02 ENCOUNTER — Encounter (HOSPITAL_COMMUNITY): Payer: Self-pay | Admitting: Ophthalmology

## 2012-07-02 ENCOUNTER — Other Ambulatory Visit: Payer: Self-pay | Admitting: Family Medicine

## 2012-07-02 LAB — GLUCOSE, CAPILLARY
Glucose-Capillary: 160 mg/dL — ABNORMAL HIGH (ref 70–99)
Glucose-Capillary: 198 mg/dL — ABNORMAL HIGH (ref 70–99)

## 2012-07-02 MED ORDER — BACITRACIN-POLYMYXIN B 500-10000 UNIT/GM OP OINT: 1.0000 "application " | TOPICAL_OINTMENT | Freq: Four times a day (QID) | OPHTHALMIC | Status: DC

## 2012-07-02 MED ORDER — PREDNISOLONE ACETATE 1 % OP SUSP: 1.0000 [drp] | Freq: Four times a day (QID) | OPHTHALMIC | Status: DC

## 2012-07-02 MED ORDER — GATIFLOXACIN 0.5 % OP SOLN: 1.0000 [drp] | Freq: Four times a day (QID) | OPHTHALMIC | Status: DC

## 2012-07-02 MED ORDER — HYDROCODONE-ACETAMINOPHEN 5-500 MG PO TABS: 1.0000 | ORAL_TABLET | Freq: Four times a day (QID) | ORAL | Status: DC | PRN

## 2012-07-02 NOTE — Op Note (Signed)
Kathy Hardy, Kathy Hardy              ACCOUNT NO.:  [id number]  MEDICAL RECORD NO.:  [id number]  LOCATION:  6N20C                        FACILITY:  [facility]  PHYSICIAN:  Beulah Gandy. Ashley Royalty, M.D. DATE OF BIRTH:  1955/10/23  DATE OF PROCEDURE:  07/01/2012 DATE OF DISCHARGE:                              OPERATIVE REPORT   ADMISSION DIAGNOSES:  Traction retinal detachment, right eye, with proliferative diabetic retinopathy, and vitreous hemorrhage.  PROCEDURES:  Repair of traction retinal detachment with pars plana vitrectomy, retinal photocoagulation, membrane peel, gas-fluid exchange, endodiathermy in the right eye.  SURGEON:  Beulah Gandy. Ashley Royalty, M.D.  ASSISTANT:  Rosalie Doctor, SA.  ANESTHESIA:  General.  DETAILS:  Usual prep and drape, 25-gauge trocar was placed at 8, 10, and 2 o'clock, infusion at 8 o'clock.  Pars plana vitrectomy was begun just behind the pseudophakos where large clots of blood were encountered. These were carefully removed under low suction and rapid cutting in a core fashion.  The core vitrectomy was carried down to the macular surface where surface proliferation and traction detachment was seen. The traction detachment was released with the lighted pick, the vitreous cutter and vitreous forceps.  The attachment of vitreous to the disc was removed with the vitreous forceps.  The vitrectomy was then carried into the mid periphery where surface proliferation was seen and removed for 360 degrees.  Once this was accomplished, the far periphery and vitreous base area was treated with vitrectomy removing all vitreous and blood from the vitreous base.  Scleral depression was used.  The wide field viewing system and the super wide field viewing system with BIOM was used to gain access to the vitreous base.  The vitreous base was trimmed.  Endodiathermy was used for hemostasis.  The endolaser was positioned in the eye, 1050 burns were placed around the retinal periphery.   The power was 1000 mW, 1000 microns each of 0.1 seconds each.  Once all traction detachment was released, a total gas-fluid exchange was carried out.  The silicone tip suction line was used to remove all blood from the retinal surface.  The instruments were removed from the eye and the wounds were secured, they were tested and found to be tight.  Polymyxin and gentamicin were irrigated into tenon space. Atropine solution was applied.  Marcaine was injected around the globe for postop pain.  Decadron 10 mg was injected into the lower subconjunctival space.  Polysporin ophthalmic ointment, a patch and shield were placed.  Closing pressure was 10 with a Barraquer tonometer.  COMPLICATIONS:  None.  DURATION:  As of 1 hour.  The patient was awakened and taken to the recovery in satisfactory condition.     Beulah Gandy. Ashley Royalty, M.D.     JDM/MEDQ  D:  07/01/2012  T:  07/02/2012  Job:  [id number]

## 2012-07-02 NOTE — Progress Notes (Signed)
07/02/2012, 6:30 AM  Mental Status:  Awake, Alert, Oriented  Anterior segment: Cornea  Clear    Anterior Chamber Clear    Lens:    IOL  Intra Ocular Pressure 13 mmHg with Tonopen  Vitreous: Clear 90%gas bubble  Retina:  Attached Good laser reaction  Impression: Excellent result Retina attached  Final Diagnosis: Principal Problem:  *Traction retinal detachment Active Problems:  Proliferative diabetic retinopathy   Plan: start post operative eye drops.  Discharge to home.  Give post operative instructions  Kathy Hardy 07/02/2012, 6:30 AM

## 2012-07-02 NOTE — Progress Notes (Signed)
DC HOME WITH FRIEND, VERBALLY UNDERSTOOD DC INSTRUCTIONS, NO QUESTIONS ASKED 

## 2012-07-03 ENCOUNTER — Other Ambulatory Visit: Payer: Self-pay | Admitting: Family Medicine

## 2012-07-03 NOTE — Discharge Summary (Signed)
Discharge summary not needed on [facility]ER patients per medical records. 

## 2012-07-07 ENCOUNTER — Telehealth: Payer: Self-pay | Admitting: Family Medicine

## 2012-07-07 NOTE — Telephone Encounter (Signed)
Patient called and stated about the appointment with Dr. Fransico Him

## 2012-07-07 NOTE — Telephone Encounter (Signed)
Patient is aware 

## 2012-07-08 ENCOUNTER — Inpatient Hospital Stay (INDEPENDENT_AMBULATORY_CARE_PROVIDER_SITE_OTHER): Payer: Medicaid Other | Admitting: Ophthalmology

## 2012-07-11 ENCOUNTER — Inpatient Hospital Stay (INDEPENDENT_AMBULATORY_CARE_PROVIDER_SITE_OTHER): Payer: Medicaid Other | Admitting: Ophthalmology

## 2012-07-11 DIAGNOSIS — E1139 Type 2 diabetes mellitus with other diabetic ophthalmic complication: Secondary | ICD-10-CM

## 2012-07-11 DIAGNOSIS — E11359 Type 2 diabetes mellitus with proliferative diabetic retinopathy without macular edema: Secondary | ICD-10-CM

## 2012-07-17 ENCOUNTER — Telehealth: Payer: Self-pay | Admitting: Family Medicine

## 2012-07-17 ENCOUNTER — Ambulatory Visit: Payer: Medicaid Other | Admitting: Family Medicine

## 2012-07-17 MED ORDER — GUAIFENESIN 100 MG/5ML PO LIQD: 200.0000 mg | Freq: Three times a day (TID) | ORAL | Status: DC | PRN

## 2012-07-17 NOTE — Telephone Encounter (Signed)
Please let her know, medicaid may not cover this, she can also just get robitussin over the counter

## 2012-07-18 NOTE — Telephone Encounter (Signed)
Called to let pt know. Voicemail not set up and pt did not answer, Will make aware if she calls back

## 2012-07-24 ENCOUNTER — Ambulatory Visit: Payer: Medicaid Other | Admitting: Family Medicine

## 2012-07-28 ENCOUNTER — Ambulatory Visit (INDEPENDENT_AMBULATORY_CARE_PROVIDER_SITE_OTHER): Payer: Medicaid Other | Admitting: Family Medicine

## 2012-07-28 ENCOUNTER — Encounter: Payer: Self-pay | Admitting: Family Medicine

## 2012-07-28 VITALS — BP 168/84 | HR 96 | Resp 18 | Ht 62.0 in | Wt 172.1 lb

## 2012-07-28 DIAGNOSIS — E118 Type 2 diabetes mellitus with unspecified complications: Secondary | ICD-10-CM

## 2012-07-28 DIAGNOSIS — I10 Essential (primary) hypertension: Secondary | ICD-10-CM

## 2012-07-28 DIAGNOSIS — Z79899 Other long term (current) drug therapy: Secondary | ICD-10-CM

## 2012-07-28 NOTE — Assessment & Plan Note (Signed)
Another appt set for the endocrinologist [facility] has not had lantus filled yet, pt states she was given many pens and has 2 boxes at home  She is to bring everything again to next visit
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
## 2012-07-28 NOTE — Assessment & Plan Note (Addendum)
Significant time spent on medication management with this patient. Reiterate the importance of taking her medications as prescribed my nurse labeled each bottle that she was supposed to take and also obtain a six-month review of her medications from her pharmacy in all of her medications have been dispensed for a regular cycle except her insulin/crestor is not being shown Drug store has never filled lantus for her, crestor has been on hold since June, see above she has 2 bottles

## 2012-07-28 NOTE — Patient Instructions (Signed)
You need to take the medications with the stickers on them You are missing your blood pressure medication ( NORVASC/AMLODIPINE) get from pharmacy and take once a day  You will be rescheduled for diabetes doctor they do take medicaid Light bill completed  New prescription for meter- try to get a battery first  Take blood sugar- Fasting , and before meals  F/U 2 weeks for blood pressure

## 2012-07-28 NOTE — Assessment & Plan Note (Signed)
Uncontrolled see above

## 2012-08-05 ENCOUNTER — Other Ambulatory Visit: Payer: Self-pay | Admitting: Family Medicine

## 2012-08-06 ENCOUNTER — Other Ambulatory Visit: Payer: Self-pay

## 2012-08-06 MED ORDER — ALPRAZOLAM 0.5 MG PO TABS: 0.5000 mg | ORAL_TABLET | Freq: Every evening | ORAL | Status: DC | PRN

## 2012-08-07 ENCOUNTER — Encounter (INDEPENDENT_AMBULATORY_CARE_PROVIDER_SITE_OTHER): Payer: Medicaid Other | Admitting: Ophthalmology

## 2012-08-14 ENCOUNTER — Encounter: Payer: Self-pay | Admitting: Family Medicine

## 2012-08-14 ENCOUNTER — Ambulatory Visit (INDEPENDENT_AMBULATORY_CARE_PROVIDER_SITE_OTHER): Payer: Medicaid Other | Admitting: Family Medicine

## 2012-08-14 VITALS — BP 122/70 | HR 72 | Resp 18 | Ht 62.0 in | Wt 171.0 lb

## 2012-08-14 DIAGNOSIS — E118 Type 2 diabetes mellitus with unspecified complications: Secondary | ICD-10-CM

## 2012-08-14 DIAGNOSIS — I10 Essential (primary) hypertension: Secondary | ICD-10-CM

## 2012-08-14 NOTE — Patient Instructions (Signed)
Continue current medications Stop at front desk about your diabetes appt with Dr. Fransico Him  F/U 3 months

## 2012-08-15 ENCOUNTER — Encounter: Payer: Self-pay | Admitting: Family Medicine

## 2012-08-15 NOTE — Assessment & Plan Note (Signed)
This is the best I have seen her blood pressure unfortunately she did not bring her medications but hopefully she is taking all the ones that we spent time laying out before her and labeling for her.

## 2012-08-15 NOTE — Assessment & Plan Note (Signed)
She was given a new appointment for the endocrinologist today she will arrange transportation for this at this time I will not change her insulin

## 2012-08-15 NOTE — Progress Notes (Signed)
  Subjective:    Patient ID: NYJA WESTBROOK, female    DOB: 25-Jul-1956, 57 y.o.   MRN: [id number]  HPI  Patient here to followup hypertension and diabetes. She states that her meter now work she received a new one her blood sugar was 101 this morning however she did not bring her meter or any of her medications. Hypertension states she's been taking all the medications that I wrote out for her 2 weeks ago, no side effected noted.  Review of Systems - per above  GEN- denies fatigue, fever, weight loss,weakness, recent illness HEENT- denies eye drainage, change in vision, nasal discharge, CVS- denies chest pain, palpitations RESP- denies SOB, cough, wheeze         Objective:   Physical Exam  GEM-NAD,alert and oriented x 3, BP recheck 138/80 CVS-RRR, no murmur RESP-CTAB EXT- no edema Pulse- radial 2+       Assessment & Plan:

## 2012-09-03 ENCOUNTER — Other Ambulatory Visit: Payer: Self-pay

## 2012-09-03 MED ORDER — DICYCLOMINE HCL 10 MG PO CAPS: ORAL_CAPSULE | ORAL | Status: DC

## 2012-09-03 NOTE — Telephone Encounter (Signed)
Dawn can you please make him a follow up appointment.

## 2012-09-03 NOTE — Telephone Encounter (Signed)
Pt due for FU IBS

## 2012-09-04 ENCOUNTER — Other Ambulatory Visit: Payer: Self-pay | Admitting: Family Medicine

## 2012-09-09 DIAGNOSIS — G8929 Other chronic pain: Secondary | ICD-10-CM

## 2012-09-09 DIAGNOSIS — I10 Essential (primary) hypertension: Secondary | ICD-10-CM

## 2012-09-09 DIAGNOSIS — M2580 Other specified joint disorders, unspecified joint: Secondary | ICD-10-CM

## 2012-09-09 DIAGNOSIS — E119 Type 2 diabetes mellitus without complications: Secondary | ICD-10-CM

## 2012-09-18 ENCOUNTER — Encounter: Payer: Self-pay | Admitting: Urgent Care

## 2012-09-18 NOTE — Telephone Encounter (Signed)
Mailed letter to patient to call our office to set up OV °

## 2012-10-03 ENCOUNTER — Ambulatory Visit (INDEPENDENT_AMBULATORY_CARE_PROVIDER_SITE_OTHER): Payer: Self-pay | Admitting: Ophthalmology

## 2012-10-06 ENCOUNTER — Other Ambulatory Visit: Payer: Self-pay

## 2012-10-06 MED ORDER — DICYCLOMINE HCL 10 MG PO CAPS: ORAL_CAPSULE | ORAL | Status: DC

## 2012-10-06 NOTE — Telephone Encounter (Signed)
Needs routine follow-up for IBS.  I am refilling Bentyl X 1 in the interim.

## 2012-10-07 ENCOUNTER — Encounter: Payer: Self-pay | Admitting: Gastroenterology

## 2012-10-07 NOTE — Telephone Encounter (Signed)
Unable to reach patient by phone. Mailed appt card for 3/31 at 10 with AS

## 2012-10-08 ENCOUNTER — Other Ambulatory Visit: Payer: Self-pay

## 2012-10-08 ENCOUNTER — Encounter: Payer: Self-pay | Admitting: Family Medicine

## 2012-10-08 MED ORDER — GABAPENTIN 300 MG PO CAPS: ORAL_CAPSULE | ORAL | Status: DC

## 2012-10-13 ENCOUNTER — Other Ambulatory Visit: Payer: Self-pay

## 2012-10-13 ENCOUNTER — Telehealth: Payer: Self-pay | Admitting: Family Medicine

## 2012-10-13 MED ORDER — ALPRAZOLAM 0.5 MG PO TABS: ORAL_TABLET | ORAL | Status: DC

## 2012-10-13 NOTE — Telephone Encounter (Signed)
Please call and triage pt She can refill xanax but only 1 tablet at bedtime Offer an appointment

## 2012-10-13 NOTE — Telephone Encounter (Signed)
Phone number is disconnected.  Unable to reach patient.  Tried boyfriend Kerby Nora Portales) as well.

## 2012-10-23 ENCOUNTER — Encounter: Payer: Self-pay | Admitting: Gastroenterology

## 2012-10-23 ENCOUNTER — Ambulatory Visit (INDEPENDENT_AMBULATORY_CARE_PROVIDER_SITE_OTHER): Payer: Medicaid Other | Admitting: Family Medicine

## 2012-10-23 VITALS — BP 130/78 | HR 76 | Resp 18 | Ht 62.0 in | Wt 167.1 lb

## 2012-10-23 DIAGNOSIS — J019 Acute sinusitis, unspecified: Secondary | ICD-10-CM

## 2012-10-23 DIAGNOSIS — J209 Acute bronchitis, unspecified: Secondary | ICD-10-CM

## 2012-10-23 DIAGNOSIS — IMO0002 Reserved for concepts with insufficient information to code with codable children: Secondary | ICD-10-CM

## 2012-10-23 DIAGNOSIS — IMO0001 Reserved for inherently not codable concepts without codable children: Secondary | ICD-10-CM

## 2012-10-23 DIAGNOSIS — E785 Hyperlipidemia, unspecified: Secondary | ICD-10-CM

## 2012-10-23 DIAGNOSIS — E118 Type 2 diabetes mellitus with unspecified complications: Secondary | ICD-10-CM

## 2012-10-23 DIAGNOSIS — E1165 Type 2 diabetes mellitus with hyperglycemia: Secondary | ICD-10-CM

## 2012-10-23 LAB — HEMOGLOBIN A1C
Hgb A1c MFr Bld: 11.5 % — ABNORMAL HIGH (ref <5.7)
Mean Plasma Glucose: 283 mg/dL — ABNORMAL HIGH (ref <117)

## 2012-10-23 MED ORDER — AMOXICILLIN 500 MG PO TABS: 500.0000 mg | ORAL_TABLET | Freq: Two times a day (BID) | ORAL | Status: DC

## 2012-10-23 NOTE — Patient Instructions (Signed)
Get the labs done today before you leave Antibiotics, and Mucinex Continue nose spray Continue insulin F/U in April as previous

## 2012-10-23 NOTE — Progress Notes (Signed)
  Subjective:    Patient ID: Kathy Hardy, female    DOB: July 31, 1955, 57 y.o.   MRN: [id number]  HPI  Patient here with sinus pressure and drainage cough with production chills and body aches for the past 2 weeks. She states that she hurts over her ribs where she cracks them before. Her cough is productive of thick sputum when she can get it up. She's taken entire bottle of TheraFlu and Robitussin I sent without any improvement. Had N/V diarrhea a few days now resolved  DM- CBG fasting 140,s giving 30 units of lantus, no hypoglycemia Review of Systems  GEN- +fatigue, fever, weight loss,weakness, recent illness HEENT- denies eye drainage, change in vision, +nasal discharge, CVS- denies chest pain, palpitations RESP- denies SOB, +cough, wheeze ABD- denies N/V, change in stools, abd pain Neuro- denies headache, dizziness, syncope, seizure activity      Objective:   Physical Exam  GEN- NAD, alert and oriented x3 HEENT- PERRL, EOMI, non injected sclera, pink conjunctiva, MMM, oropharynx mild injection, TM clear bilat no effusion,  + maxillary sinus tenderness, inflammed turbinates, + Nasal drainage  Neck- Supple, no LAD CVS- RRR, no murmur RESP-mild rhonchi bilateral bases, normal WOB, no wheeze EXT- No edema Pulses- Radial 2+         Assessment & Plan:

## 2012-10-23 NOTE — Assessment & Plan Note (Signed)
Antibiotics, mucinex given

## 2012-10-23 NOTE — Assessment & Plan Note (Signed)
Uncontrolled, still has not seen endocrine, taking 30 units, she is often non compliant, get A1C today, she has no telephone in order, will have to wait on endocrine for now

## 2012-10-23 NOTE — Assessment & Plan Note (Signed)
Antibiotics, steriod nose spray

## 2012-10-24 LAB — BASIC METABOLIC PANEL
BUN: 12 mg/dL (ref 6–23)
Calcium: 9 mg/dL (ref 8.4–10.5)
Creat: 0.72 mg/dL (ref 0.50–1.10)

## 2012-10-27 ENCOUNTER — Ambulatory Visit: Payer: Medicaid Other | Admitting: Gastroenterology

## 2012-10-27 ENCOUNTER — Telehealth: Payer: Self-pay | Admitting: Gastroenterology

## 2012-10-27 NOTE — Telephone Encounter (Signed)
Pt was a no show

## 2012-10-27 NOTE — Telephone Encounter (Signed)
Please send letter to return for routine f/u.

## 2012-10-28 ENCOUNTER — Encounter: Payer: Self-pay | Admitting: Family Medicine

## 2012-10-28 ENCOUNTER — Encounter: Payer: Self-pay | Admitting: Gastroenterology

## 2012-10-28 MED ORDER — ROSUVASTATIN CALCIUM 20 MG PO TABS: 20.0000 mg | ORAL_TABLET | Freq: Every day | ORAL | Status: DC

## 2012-10-28 NOTE — Telephone Encounter (Signed)
Mailed letter to patient to call our office to set up OV °

## 2012-10-28 NOTE — Addendum Note (Signed)
Addended by: Milinda Antis F on: 10/28/2012 09:43 AM   Modules accepted: Orders

## 2012-11-03 ENCOUNTER — Other Ambulatory Visit: Payer: Self-pay | Admitting: Family Medicine

## 2012-11-11 ENCOUNTER — Other Ambulatory Visit: Payer: Self-pay

## 2012-11-11 MED ORDER — ALPRAZOLAM 0.5 MG PO TABS: ORAL_TABLET | ORAL | Status: DC

## 2012-11-13 ENCOUNTER — Ambulatory Visit: Payer: Medicaid Other | Admitting: Family Medicine

## 2012-11-17 ENCOUNTER — Other Ambulatory Visit: Payer: Self-pay | Admitting: Family Medicine

## 2012-12-02 ENCOUNTER — Other Ambulatory Visit: Payer: Self-pay

## 2012-12-02 ENCOUNTER — Ambulatory Visit: Payer: Medicaid Other | Admitting: Family Medicine

## 2012-12-03 ENCOUNTER — Other Ambulatory Visit: Payer: Self-pay | Admitting: Family Medicine

## 2012-12-03 ENCOUNTER — Other Ambulatory Visit: Payer: Self-pay | Admitting: Gastroenterology

## 2012-12-03 MED ORDER — DICYCLOMINE HCL 10 MG PO CAPS: ORAL_CAPSULE | ORAL | Status: DC

## 2012-12-04 ENCOUNTER — Other Ambulatory Visit: Payer: Self-pay

## 2012-12-04 MED ORDER — DICYCLOMINE HCL 10 MG PO CAPS: ORAL_CAPSULE | ORAL | Status: DC

## 2012-12-11 ENCOUNTER — Telehealth: Payer: Self-pay | Admitting: Family Medicine

## 2012-12-12 NOTE — Telephone Encounter (Signed)
They are going to refax form  

## 2012-12-18 ENCOUNTER — Ambulatory Visit: Payer: Medicaid Other | Admitting: Family Medicine

## 2012-12-18 ENCOUNTER — Encounter: Payer: Self-pay | Admitting: Family Medicine

## 2012-12-19 ENCOUNTER — Other Ambulatory Visit: Payer: Self-pay | Admitting: Family Medicine

## 2012-12-19 MED ORDER — PANTOPRAZOLE SODIUM 40 MG PO TBEC: 40.0000 mg | DELAYED_RELEASE_TABLET | Freq: Every day | ORAL | Status: DC

## 2012-12-29 ENCOUNTER — Telehealth: Payer: Self-pay | Admitting: Family Medicine

## 2012-12-29 MED ORDER — AMLODIPINE BESYLATE 5 MG PO TABS: 5.0000 mg | ORAL_TABLET | Freq: Every day | ORAL | Status: DC

## 2012-12-29 NOTE — Telephone Encounter (Signed)
Med rf °

## 2013-01-08 DIAGNOSIS — E119 Type 2 diabetes mellitus without complications: Secondary | ICD-10-CM

## 2013-01-08 DIAGNOSIS — M2580 Other specified joint disorders, unspecified joint: Secondary | ICD-10-CM

## 2013-01-08 DIAGNOSIS — I10 Essential (primary) hypertension: Secondary | ICD-10-CM

## 2013-01-08 DIAGNOSIS — G8929 Other chronic pain: Secondary | ICD-10-CM

## 2013-02-02 ENCOUNTER — Telehealth: Payer: Self-pay | Admitting: Family Medicine

## 2013-02-02 MED ORDER — GABAPENTIN 300 MG PO CAPS: ORAL_CAPSULE | ORAL | Status: DC

## 2013-02-02 NOTE — Telephone Encounter (Signed)
Med refilled.

## 2013-02-18 ENCOUNTER — Telehealth: Payer: Self-pay | Admitting: Family Medicine

## 2013-02-18 DIAGNOSIS — IMO0001 Reserved for inherently not codable concepts without codable children: Secondary | ICD-10-CM

## 2013-02-18 DIAGNOSIS — L989 Disorder of the skin and subcutaneous tissue, unspecified: Secondary | ICD-10-CM

## 2013-02-18 NOTE — Telephone Encounter (Signed)
Pt aware.

## 2013-02-18 NOTE — Telephone Encounter (Signed)
Referral placed, if she has any signs of infeciton, pus, redness, she needs to make OV because podiatry may take some time

## 2013-02-20 ENCOUNTER — Ambulatory Visit: Payer: Medicaid Other | Admitting: Family Medicine

## 2013-02-20 ENCOUNTER — Encounter (HOSPITAL_COMMUNITY): Payer: Self-pay | Admitting: Emergency Medicine

## 2013-02-20 ENCOUNTER — Emergency Department (HOSPITAL_COMMUNITY)
Admission: EM | Admit: 2013-02-20 | Discharge: 2013-02-20 | Disposition: A | Discharge status: Home / Self Care | Payer: Medicaid Other | Attending: Emergency Medicine | Admitting: Emergency Medicine

## 2013-02-20 DIAGNOSIS — Z8619 Personal history of other infectious and parasitic diseases: Secondary | ICD-10-CM | POA: Insufficient documentation

## 2013-02-20 DIAGNOSIS — L84 Corns and callosities: Secondary | ICD-10-CM | POA: Insufficient documentation

## 2013-02-20 DIAGNOSIS — Z79899 Other long term (current) drug therapy: Secondary | ICD-10-CM | POA: Insufficient documentation

## 2013-02-20 DIAGNOSIS — K219 Gastro-esophageal reflux disease without esophagitis: Secondary | ICD-10-CM | POA: Insufficient documentation

## 2013-02-20 DIAGNOSIS — G5790 Unspecified mononeuropathy of unspecified lower limb: Secondary | ICD-10-CM | POA: Insufficient documentation

## 2013-02-20 DIAGNOSIS — E119 Type 2 diabetes mellitus without complications: Secondary | ICD-10-CM | POA: Insufficient documentation

## 2013-02-20 DIAGNOSIS — Z8673 Personal history of transient ischemic attack (TIA), and cerebral infarction without residual deficits: Secondary | ICD-10-CM | POA: Insufficient documentation

## 2013-02-20 DIAGNOSIS — Z8679 Personal history of other diseases of the circulatory system: Secondary | ICD-10-CM | POA: Insufficient documentation

## 2013-02-20 DIAGNOSIS — Z8542 Personal history of malignant neoplasm of other parts of uterus: Secondary | ICD-10-CM | POA: Insufficient documentation

## 2013-02-20 DIAGNOSIS — Z794 Long term (current) use of insulin: Secondary | ICD-10-CM | POA: Insufficient documentation

## 2013-02-20 DIAGNOSIS — M545 Low back pain, unspecified: Secondary | ICD-10-CM | POA: Insufficient documentation

## 2013-02-20 DIAGNOSIS — I10 Essential (primary) hypertension: Secondary | ICD-10-CM | POA: Insufficient documentation

## 2013-02-20 DIAGNOSIS — M25559 Pain in unspecified hip: Secondary | ICD-10-CM | POA: Insufficient documentation

## 2013-02-20 DIAGNOSIS — Z7902 Long term (current) use of antithrombotics/antiplatelets: Secondary | ICD-10-CM | POA: Insufficient documentation

## 2013-02-20 DIAGNOSIS — Z8739 Personal history of other diseases of the musculoskeletal system and connective tissue: Secondary | ICD-10-CM | POA: Insufficient documentation

## 2013-02-20 DIAGNOSIS — J45909 Unspecified asthma, uncomplicated: Secondary | ICD-10-CM | POA: Insufficient documentation

## 2013-02-20 DIAGNOSIS — L98491 Non-pressure chronic ulcer of skin of other sites limited to breakdown of skin: Secondary | ICD-10-CM

## 2013-02-20 DIAGNOSIS — F411 Generalized anxiety disorder: Secondary | ICD-10-CM | POA: Insufficient documentation

## 2013-02-20 DIAGNOSIS — E785 Hyperlipidemia, unspecified: Secondary | ICD-10-CM | POA: Insufficient documentation

## 2013-02-20 DIAGNOSIS — E11311 Type 2 diabetes mellitus with unspecified diabetic retinopathy with macular edema: Secondary | ICD-10-CM | POA: Insufficient documentation

## 2013-02-20 DIAGNOSIS — R209 Unspecified disturbances of skin sensation: Secondary | ICD-10-CM | POA: Insufficient documentation

## 2013-02-20 MED ORDER — CYCLOBENZAPRINE HCL 10 MG PO TABS: 10.0000 mg | ORAL_TABLET | Freq: Two times a day (BID) | ORAL | Status: DC | PRN

## 2013-02-20 NOTE — ED Provider Notes (Signed)
CSN: [id number]     Arrival date & time 02/20/13  1254 History     First MD Initiated Contact with Patient 02/20/13 1313     Chief Complaint  Patient presents with  . Toe Pain  . Hip Pain   (Consider location/radiation/quality/duration/timing/severity/associated sxs/prior Treatment) HPI Comments: 57 yo female with DM, HTN, PVD, stroke, neuropathy presents with left toe pain and left back pain for one week.  Pain left lower back/ buttocks with mild radiation down left posterior thigh to knee.  Worse with movement and position.  No abd pain.  No new weakness.  Chronic neuropathy lower legs.  No fevers or back surgeries.  No injuries.  Callous on toe for weeks, thickening, no redness or fevers.  Pt was recommended to see foot dr in [place].   Patient is a 57 y.o. female presenting with toe pain and hip pain. The history is provided by the patient.  Toe Pain This is a recurrent problem. Pertinent negatives include no chest pain, no abdominal pain, no headaches and no shortness of breath.  Hip Pain Pertinent negatives include no chest pain, no abdominal pain, no headaches and no shortness of breath.    Past Medical History  Diagnosis Date  . Hypertension   . Diabetes mellitus   . Hyperlipidemia   . Anxiety   . 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 Arthritis   . DDD (degenerative disc disease)     lumbar  . Insomnia   . Uterine cancer 2000    unknown what type  . 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 Stroke     TIAx 4,   . Peripheral vascular disease   . Shortness of breath     with exertion  . Asthma   . GERD (gastroesophageal reflux disease)   . Neuropathy    Past Surgical History  Procedure Laterality Date  . Right leg surgery x 4 (polio)    . Cholecystectomy      [facility]  . Abdominal hysterectomy      [facility]  . Eye surgery      bilateral  . Colonoscopy  07/09/2011    NWG:[id number] hemorrhoids/Poor prep in the right colon  . Esophagogastroduodenoscopy   07/09/2011    MVH:QION gastritis  . Retinal detachment surgery  07/01/2012  . Pars plana vitrectomy  07/01/2012    Procedure: PARS PLANA VITRECTOMY WITH 25 GAUGE;  Surgeon: Sherrie George, MD;  Location: [facility] OR;  Service: Ophthalmology;  Laterality: Right;  with Laser treatment, Membrane peel, Gas Injection and Repair of Traction Retinal Detachment RIGHT eye   Family History  Problem Relation Age of Onset  . Diabetes Mother   . Hypertension Mother   . Diabetes Sister   . Cancer Maternal Grandmother     type?  Marland Kitchen Anesthesia problems Neg Hx   . Hypotension Neg Hx   . Malignant hyperthermia Neg Hx   . Pseudochol deficiency Neg Hx    History  Substance Use Topics  . Smoking status: Never Smoker   . Smokeless tobacco: Never Used  . Alcohol Use: No   OB History   Grav Para Term Preterm Abortions TAB SAB Ect Mult Living                 Review of Systems  Constitutional: Negative for fever and chills.  HENT: Negative for neck pain and neck stiffness.   Eyes: Negative for visual disturbance.  Respiratory: Negative for  shortness of breath.   Cardiovascular: Negative for chest pain.  Gastrointestinal: Negative for vomiting and abdominal pain.  Genitourinary: Negative for dysuria and flank pain.  Musculoskeletal: Positive for back pain.  Skin: Positive for wound. Negative for rash.  Neurological: Positive for numbness. Negative for weakness, light-headedness and headaches.    Allergies  Oxycodone and Percocet  Home Medications   Current Outpatient Rx  Name  Route  Sig  Dispense  Refill  . albuterol (PROVENTIL HFA;VENTOLIN HFA) 108 (90 BASE) MCG/ACT inhaler   Inhalation   Inhale 2 puffs into the lungs every 6 (six) hours as needed for wheezing or shortness of breath.         . ALPRAZolam (XANAX) 0.5 MG tablet   Oral   Take 0.5 mg by mouth at bedtime as needed for sleep.         Marland Kitchen amLODipine (NORVASC) 5 MG tablet   Oral   Take 1 tablet (5 mg total) by mouth daily.   30  tablet   1   . cetirizine (ZYRTEC) 10 MG tablet   Oral   Take 10 mg by mouth daily.         . clopidogrel (PLAVIX) 75 MG tablet   Oral   Take 75 mg by mouth daily.         Marland Kitchen FLUoxetine (PROZAC) 20 MG capsule   Oral   Take 20 mg by mouth daily.         . furosemide (LASIX) 20 MG tablet   Oral   Take 20 mg by mouth daily.         Marland Kitchen gabapentin (NEURONTIN) 300 MG capsule   Oral   Take 300 mg by mouth at bedtime.         . insulin glargine (LANTUS) 100 UNIT/ML injection   Subcutaneous   Inject 40 Units into the skin at bedtime.          Marland Kitchen lisinopril (PRINIVIL,ZESTRIL) 40 MG tablet   Oral   Take 40 mg by mouth daily.         . metoprolol succinate (TOPROL-XL) 100 MG 24 hr tablet   Oral   Take 100 mg by mouth daily. Take with or immediately following a meal.         . pantoprazole (PROTONIX) 40 MG tablet   Oral   Take 1 tablet (40 mg total) by mouth daily. For acid reflux   30 tablet   6     D/c omeprazole due to interaction with plavix   . rosuvastatin (CRESTOR) 20 MG tablet   Oral   Take 1 tablet (20 mg total) by mouth daily.   30 tablet   6     New dose    BP 165/78  Pulse 69  Temp(Src) 98.3 F (36.8 C)  Resp 18  Ht 5\' 2"  (1.575 m)  Wt 162 lb (73.483 kg)  BMI 29.62 kg/m2  SpO2 99% Physical Exam  Nursing note and vitals reviewed. Constitutional: She is oriented to person, place, and time. She appears well-developed and well-nourished.  HENT:  Head: Normocephalic and atraumatic.  Eyes: Conjunctivae are normal. Right eye exhibits no discharge. Left eye exhibits no discharge.  Neck: Normal range of motion. Neck supple. No tracheal deviation present.  Cardiovascular: Normal rate and regular rhythm.   Pulmonary/Chest: Effort normal and breath sounds normal.  Abdominal: Soft. She exhibits no distension. There is no tenderness. There is no guarding.  Musculoskeletal: She exhibits tenderness (lower lower  SI and left lateral paraspinal msk  tender/ tight muscles, no midline tenderness). She exhibits no edema.  Neurological: She is alert and oriented to person, place, and time.  5+ strength LE bilateral with F/E of knees, hips and great toes Sensation intact in major n in LEs Neg straight leg left  Skin: Skin is warm.  Cap refill 3 sec bilateral LE Thick callous plantar aspect of great toe on left, superficial, no bone visualized, no discharge or erythema  Psychiatric: She has a normal mood and affect.    ED Course   Procedures (including critical care time)  Labs Reviewed - No data to display No results found. 1. Callous ulcer, limited to breakdown of skin   2. Left low back pain     MDM  MSK vs sciatica. Unremarkable neuro exam Close fup stressed for callous and back issues.  Reasons to return given.  No red flags on exam or hpi. Pt well appearing.   DC  Enid Skeens, MD 02/20/13 1416

## 2013-02-20 NOTE — ED Notes (Signed)
Pt c/o left hip pain and callus to left great toe x 1 week.

## 2013-02-23 ENCOUNTER — Ambulatory Visit: Payer: Medicaid Other | Admitting: Family Medicine

## 2013-03-01 DIAGNOSIS — M2580 Other specified joint disorders, unspecified joint: Secondary | ICD-10-CM

## 2013-03-01 DIAGNOSIS — I10 Essential (primary) hypertension: Secondary | ICD-10-CM

## 2013-03-01 DIAGNOSIS — E119 Type 2 diabetes mellitus without complications: Secondary | ICD-10-CM

## 2013-03-01 DIAGNOSIS — G8929 Other chronic pain: Secondary | ICD-10-CM

## 2013-03-02 ENCOUNTER — Ambulatory Visit: Payer: Medicaid Other | Admitting: General Practice

## 2013-03-03 ENCOUNTER — Encounter: Payer: Self-pay | Admitting: Family Medicine

## 2013-03-03 ENCOUNTER — Telehealth: Payer: Self-pay | Admitting: Family Medicine

## 2013-03-03 MED ORDER — CETIRIZINE HCL 10 MG PO TABS: 10.0000 mg | ORAL_TABLET | Freq: Every day | ORAL | Status: DC

## 2013-03-03 MED ORDER — AMLODIPINE BESYLATE 5 MG PO TABS: 5.0000 mg | ORAL_TABLET | Freq: Every day | ORAL | Status: DC

## 2013-03-03 MED ORDER — ALPRAZOLAM 0.5 MG PO TABS: 0.5000 mg | ORAL_TABLET | Freq: Every evening | ORAL | Status: DC | PRN

## 2013-03-03 NOTE — Telephone Encounter (Signed)
Ok to refill xanax 

## 2013-03-03 NOTE — Telephone Encounter (Signed)
Okay to give 30 day supply on xanax, okay to refill others Please remind her no further refills unless she comes in for her appointment

## 2013-03-03 NOTE — Telephone Encounter (Signed)
Medication refill for one time only.  Patient needs to be seen.  Letter sent for patient to call and schedule 

## 2013-03-06 ENCOUNTER — Ambulatory Visit: Payer: Medicaid Other | Admitting: Family Medicine

## 2013-03-13 ENCOUNTER — Telehealth: Payer: Self-pay | Admitting: Family Medicine

## 2013-03-13 ENCOUNTER — Ambulatory Visit: Payer: Medicaid Other | Admitting: Family Medicine

## 2013-03-13 NOTE — Telephone Encounter (Signed)
Called and LVM on home number,cell phone did not work   Advised she needs to speak with Print production planner about the bill and her no shows. Would only allow 1 more NS then dismiss

## 2013-03-16 ENCOUNTER — Ambulatory Visit: Payer: Medicaid Other | Admitting: Family Medicine

## 2013-04-02 ENCOUNTER — Other Ambulatory Visit: Payer: Self-pay | Admitting: Family Medicine

## 2013-04-02 ENCOUNTER — Telehealth: Payer: Self-pay | Admitting: Family Medicine

## 2013-04-02 NOTE — Telephone Encounter (Signed)
?   Ok to refill, pt has appt on Sept 16

## 2013-04-02 NOTE — Telephone Encounter (Signed)
Xanax 0.5 mg tab 1 QHS prn #30 last rf 03/03/13

## 2013-04-02 NOTE — Telephone Encounter (Signed)
?   Ok to refill, pt has appt on Sept 16 °

## 2013-04-03 MED ORDER — ALPRAZOLAM 0.5 MG PO TABS: 0.5000 mg | ORAL_TABLET | Freq: Every evening | ORAL | Status: DC | PRN

## 2013-04-03 NOTE — Telephone Encounter (Signed)
Med phoned in °

## 2013-04-03 NOTE — Telephone Encounter (Signed)
Give 30 day supply only, no refills, she must come to appt on Sept 16th

## 2013-04-07 ENCOUNTER — Emergency Department (HOSPITAL_COMMUNITY)
Admission: EM | Admit: 2013-04-07 | Discharge: 2013-04-07 | Disposition: A | Discharge status: Home / Self Care | Payer: Medicaid Other | Attending: Emergency Medicine | Admitting: Emergency Medicine

## 2013-04-07 ENCOUNTER — Encounter (HOSPITAL_COMMUNITY): Payer: Self-pay

## 2013-04-07 DIAGNOSIS — E1139 Type 2 diabetes mellitus with other diabetic ophthalmic complication: Secondary | ICD-10-CM | POA: Insufficient documentation

## 2013-04-07 DIAGNOSIS — Z79899 Other long term (current) drug therapy: Secondary | ICD-10-CM | POA: Insufficient documentation

## 2013-04-07 DIAGNOSIS — F411 Generalized anxiety disorder: Secondary | ICD-10-CM | POA: Insufficient documentation

## 2013-04-07 DIAGNOSIS — K219 Gastro-esophageal reflux disease without esophagitis: Secondary | ICD-10-CM | POA: Insufficient documentation

## 2013-04-07 DIAGNOSIS — Z8673 Personal history of transient ischemic attack (TIA), and cerebral infarction without residual deficits: Secondary | ICD-10-CM | POA: Insufficient documentation

## 2013-04-07 DIAGNOSIS — S51002A Unspecified open wound of left elbow, initial encounter: Secondary | ICD-10-CM

## 2013-04-07 DIAGNOSIS — I10 Essential (primary) hypertension: Secondary | ICD-10-CM | POA: Insufficient documentation

## 2013-04-07 DIAGNOSIS — E11311 Type 2 diabetes mellitus with unspecified diabetic retinopathy with macular edema: Secondary | ICD-10-CM | POA: Insufficient documentation

## 2013-04-07 DIAGNOSIS — W57XXXA Bitten or stung by nonvenomous insect and other nonvenomous arthropods, initial encounter: Secondary | ICD-10-CM

## 2013-04-07 DIAGNOSIS — E785 Hyperlipidemia, unspecified: Secondary | ICD-10-CM | POA: Insufficient documentation

## 2013-04-07 DIAGNOSIS — R197 Diarrhea, unspecified: Secondary | ICD-10-CM | POA: Insufficient documentation

## 2013-04-07 DIAGNOSIS — J45909 Unspecified asthma, uncomplicated: Secondary | ICD-10-CM | POA: Insufficient documentation

## 2013-04-07 DIAGNOSIS — Y929 Unspecified place or not applicable: Secondary | ICD-10-CM | POA: Insufficient documentation

## 2013-04-07 DIAGNOSIS — Z7902 Long term (current) use of antithrombotics/antiplatelets: Secondary | ICD-10-CM | POA: Insufficient documentation

## 2013-04-07 DIAGNOSIS — Z8542 Personal history of malignant neoplasm of other parts of uterus: Secondary | ICD-10-CM | POA: Insufficient documentation

## 2013-04-07 DIAGNOSIS — Z8739 Personal history of other diseases of the musculoskeletal system and connective tissue: Secondary | ICD-10-CM | POA: Insufficient documentation

## 2013-04-07 DIAGNOSIS — Z8612 Personal history of poliomyelitis: Secondary | ICD-10-CM | POA: Insufficient documentation

## 2013-04-07 DIAGNOSIS — Y9389 Activity, other specified: Secondary | ICD-10-CM | POA: Insufficient documentation

## 2013-04-07 DIAGNOSIS — R110 Nausea: Secondary | ICD-10-CM | POA: Insufficient documentation

## 2013-04-07 DIAGNOSIS — Z794 Long term (current) use of insulin: Secondary | ICD-10-CM | POA: Insufficient documentation

## 2013-04-07 DIAGNOSIS — Z8669 Personal history of other diseases of the nervous system and sense organs: Secondary | ICD-10-CM | POA: Insufficient documentation

## 2013-04-07 DIAGNOSIS — S51009A Unspecified open wound of unspecified elbow, initial encounter: Secondary | ICD-10-CM | POA: Insufficient documentation

## 2013-04-07 MED ORDER — CEPHALEXIN 500 MG PO CAPS: 500.0000 mg | ORAL_CAPSULE | Freq: Four times a day (QID) | ORAL | Status: DC

## 2013-04-07 MED ORDER — ONDANSETRON 4 MG PO TBDP: ORAL_TABLET | ORAL | Status: DC

## 2013-04-07 NOTE — ED Notes (Signed)
Left in c/o family for transport home; instructions, prescriptions and f/u information given, reviewed.  Verbalizes understanding.

## 2013-04-07 NOTE — ED Notes (Signed)
Pt thinks a spider may have bitten her on Thursday, wound is to left elbow, swollen and tender, no drainage. "itches and is sore", pt is diabetic and afraid may be getting infected.

## 2013-04-07 NOTE — ED Provider Notes (Signed)
CSN: [id number]     Arrival date & time 04/07/13  1225 History  This chart was scribed for Dagmar Hait, MD by Caryn Bee, ED Scribe. This patient was seen in room APA11/APA11 and the patient's care was started 1:47 PM.    Chief Complaint  Patient presents with  . Insect Bite   Patient is a 57 y.o. female presenting with rash. The history is provided by the patient. No language interpreter was used.  Rash Location:  Shoulder/arm Shoulder/arm rash location: left elbow. Quality: swelling   Severity:  Moderate Onset quality:  Gradual Duration:  5 days Timing:  Constant Progression:  Worsening Chronicity:  New Relieved by:  None tried Worsened by:  Nothing tried Ineffective treatments:  None tried Associated symptoms: diarrhea and nausea   Associated symptoms: no abdominal pain, no fatigue, no fever, no headaches and not vomiting    HPI Comments TIANA SIVERTSON is a 57 y.o. female with h/o polio and CVA who presents to the Emergency Department complaining of a wound on her left elbow, with associated mild pain that she noticed 5 days ago. Initially the wound was a small bump that has since worsened. There is another smaller bumb near the larger one. She states that the area has not been draining. She reports that she has FROM of elbow with pain. Pt states she has been naseous and had diarrhea last night. She denies fever and vomiting. Pt has an appointment with her PCP on 04/10/13.  Pt's PCP is Dr. Milinda Antis.   Past Medical History  Diagnosis Date  . Hypertension   . Diabetes mellitus   . Hyperlipidemia   . Anxiety   . 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 Arthritis   . DDD (degenerative disc disease)     lumbar  . Insomnia   . Uterine cancer 2000    unknown what type  . 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 Stroke     TIAx 4,   . Peripheral vascular disease   . Shortness of breath     with exertion  . Asthma   . GERD (gastroesophageal reflux disease)    . Neuropathy    Past Surgical History  Procedure Laterality Date  . Right leg surgery x 4 (polio)    . Cholecystectomy      [place]  . Abdominal hysterectomy      [facility]  . Eye surgery      bilateral  . Colonoscopy  07/09/2011    [id number] hemorrhoids/Poor prep in the right colon  . Esophagogastroduodenoscopy  07/09/2011    QIO:NGEX gastritis  . Retinal detachment surgery  07/01/2012  . Pars plana vitrectomy  07/01/2012    Procedure: PARS PLANA VITRECTOMY WITH 25 GAUGE;  Surgeon: Sherrie George, MD;  Location: [facility] OR;  Service: Ophthalmology;  Laterality: Right;  with Laser treatment, Membrane peel, Gas Injection and Repair of Traction Retinal Detachment RIGHT eye   Family History  Problem Relation Age of Onset  . Diabetes Mother   . Hypertension Mother   . Diabetes Sister   . Cancer Maternal Grandmother     type?  Marland Kitchen Anesthesia problems Neg Hx   . Hypotension Neg Hx   . Malignant hyperthermia Neg Hx   . Pseudochol deficiency Neg Hx    History  Substance Use Topics  . Smoking status: Never Smoker   . Smokeless tobacco: Never Used  . Alcohol Use: No  OB History   Grav Para Term Preterm Abortions TAB SAB Ect Mult Living                 Review of Systems  Constitutional: Negative for fever, appetite change and fatigue.  HENT: Negative for congestion, sinus pressure and ear discharge.   Eyes: Negative for discharge.  Respiratory: Negative for cough.   Cardiovascular: Negative for chest pain.  Gastrointestinal: Positive for nausea and diarrhea. Negative for vomiting and abdominal pain.  Genitourinary: Negative for frequency and hematuria.  Musculoskeletal: Negative for back pain.  Skin: Positive for wound (Wound left elbow). Negative for rash.  Neurological: Negative for seizures and headaches.  Psychiatric/Behavioral: Negative for hallucinations.  All other systems reviewed and are negative.    Allergies  Oxycodone and Percocet  Home Medications    Current Outpatient Rx  Name  Route  Sig  Dispense  Refill  . albuterol (PROVENTIL HFA;VENTOLIN HFA) 108 (90 BASE) MCG/ACT inhaler   Inhalation   Inhale 2 puffs into the lungs every 6 (six) hours as needed for wheezing or shortness of breath.         . ALPRAZolam (XANAX) 0.5 MG tablet   Oral   Take 1 tablet (0.5 mg total) by mouth at bedtime as needed for sleep.   30 tablet   0   . amLODipine (NORVASC) 5 MG tablet   Oral   Take 1 tablet (5 mg total) by mouth daily.   30 tablet   0     Patient needs to be seen before any further refill ...   . cetirizine (ZYRTEC) 10 MG tablet   Oral   Take 1 tablet (10 mg total) by mouth daily.   30 tablet   0     Patient needs to be seen before any further refill ...   . clopidogrel (PLAVIX) 75 MG tablet   Oral   Take 75 mg by mouth daily.         . cyclobenzaprine (FLEXERIL) 10 MG tablet   Oral   Take 1 tablet (10 mg total) by mouth 2 (two) times daily as needed for muscle spasms.   10 tablet   0   . FLUoxetine (PROZAC) 20 MG capsule   Oral   Take 20 mg by mouth daily.         . furosemide (LASIX) 20 MG tablet   Oral   Take 20 mg by mouth daily.         Marland Kitchen gabapentin (NEURONTIN) 300 MG capsule   Oral   Take 300 mg by mouth at bedtime.         . insulin glargine (LANTUS) 100 UNIT/ML injection   Subcutaneous   Inject 40 Units into the skin at bedtime.          Marland Kitchen lisinopril (PRINIVIL,ZESTRIL) 40 MG tablet   Oral   Take 40 mg by mouth daily.         . metoprolol succinate (TOPROL-XL) 100 MG 24 hr tablet   Oral   Take 100 mg by mouth daily. Take with or immediately following a meal.         . pantoprazole (PROTONIX) 40 MG tablet   Oral   Take 1 tablet (40 mg total) by mouth daily. For acid reflux   30 tablet   6     D/c omeprazole due to interaction with plavix   . rosuvastatin (CRESTOR) 20 MG tablet   Oral   Take 20 mg  by mouth at bedtime.          BP 113/95  Pulse 89  Temp(Src) 98 F (36.7  C) (Oral)  Resp 20  Ht 5\' 2"  (1.575 m)  Wt 162 lb (73.483 kg)  BMI 29.62 kg/m2  SpO2 98%  Physical Exam  Nursing note and vitals reviewed. Constitutional: She is oriented to person, place, and time. She appears well-developed and well-nourished. No distress.  HENT:  Head: Normocephalic and atraumatic.  Eyes: EOM are normal.  Neck: Neck supple. No tracheal deviation present.  Cardiovascular: Normal rate.   Pulmonary/Chest: Effort normal. No respiratory distress.  Musculoskeletal: Normal range of motion.  Left elbow with FROM.   Neurological: She is alert and oriented to person, place, and time.  Neurovascular intact distally.  Skin: Skin is warm and dry.  4 cm wound on left elbow. No surrounding cellulitis. No drainage. No fluctuance.   Psychiatric: She has a normal mood and affect. Her behavior is normal.    ED Course  Procedures (including critical care time) DIAGNOSTIC STUDIES: Oxygen Saturation is 98% on room air, normal by my interpretation.    COORDINATION OF CARE: 1:50 PM-Will discharge pt with prescription nausea medication and antibiotics. Advised pt to follow up with PCP, and return to ED if symptoms worsen. Discussed treatment plan with pt at bedside and pt agreed to plan.   Labs Review Labs Reviewed  GLUCOSE, CAPILLARY - Abnormal; Notable for the following:    Glucose-Capillary 200 (*)    All other components within normal limits   Imaging Review No results found.  MDM   1. Insect bite   2. Elbow wound, left, initial encounter    28F with hx of polio and home nursing care presents with elbow wound. Bug bite initially, has gotten bigger. No drainage. No surrounding redness. Causes pain, but still has full ROM of L elbow. Mild nausea, no fevers, vomiting, or diarrhea. AFVSS here. L elbow with circular wound. No surrounding cellulitis. No drainage. Wound is red and has some areas that are yellow around the edges. No concern for elbow infection. Will treat with  keflex. Stable for discharge. Instructed on how to dress the wound and dressing applied here. Patient's home health nurse at bedside for dressing instruction.  I personally performed the services described in this documentation, which was scribed in my presence. The recorded information has been reviewed and is accurate.     Dagmar Hait, MD 04/07/13 2139

## 2013-04-13 ENCOUNTER — Encounter (INDEPENDENT_AMBULATORY_CARE_PROVIDER_SITE_OTHER): Payer: Medicaid Other | Admitting: Ophthalmology

## 2013-04-14 ENCOUNTER — Encounter: Payer: Self-pay | Admitting: Family Medicine

## 2013-04-14 ENCOUNTER — Ambulatory Visit (INDEPENDENT_AMBULATORY_CARE_PROVIDER_SITE_OTHER): Payer: Medicaid Other | Admitting: Family Medicine

## 2013-04-14 VITALS — BP 120/80 | HR 68 | Temp 97.0°F | Resp 20 | Ht 62.0 in | Wt 162.0 lb

## 2013-04-14 DIAGNOSIS — I10 Essential (primary) hypertension: Secondary | ICD-10-CM

## 2013-04-14 DIAGNOSIS — E785 Hyperlipidemia, unspecified: Secondary | ICD-10-CM

## 2013-04-14 DIAGNOSIS — G8929 Other chronic pain: Secondary | ICD-10-CM

## 2013-04-14 DIAGNOSIS — E118 Type 2 diabetes mellitus with unspecified complications: Secondary | ICD-10-CM

## 2013-04-14 DIAGNOSIS — J309 Allergic rhinitis, unspecified: Secondary | ICD-10-CM

## 2013-04-14 DIAGNOSIS — R42 Dizziness and giddiness: Secondary | ICD-10-CM

## 2013-04-14 LAB — CBC WITH DIFFERENTIAL/PLATELET
Basophils Relative: 1 % (ref 0–1)
HCT: 38.9 % (ref 36.0–46.0)
Hemoglobin: 13.3 g/dL (ref 12.0–15.0)
Lymphocytes Relative: 32 % (ref 12–46)
Lymphs Abs: 1.4 10*3/uL (ref 0.7–4.0)
MCHC: 34.2 g/dL (ref 30.0–36.0)
Monocytes Absolute: 0.3 10*3/uL (ref 0.1–1.0)
Monocytes Relative: 6 % (ref 3–12)
Neutro Abs: 2.6 10*3/uL (ref 1.7–7.7)
Neutrophils Relative %: 60 % (ref 43–77)
RBC: 4.4 MIL/uL (ref 3.87–5.11)
WBC: 4.3 10*3/uL (ref 4.0–10.5)

## 2013-04-14 LAB — COMPREHENSIVE METABOLIC PANEL
AST: 51 U/L — ABNORMAL HIGH (ref 0–37)
BUN: 12 mg/dL (ref 6–23)
CO2: 32 mEq/L (ref 19–32)
Calcium: 9.4 mg/dL (ref 8.4–10.5)
Chloride: 104 mEq/L (ref 96–112)
Creat: 0.63 mg/dL (ref 0.50–1.10)
Total Bilirubin: 0.5 mg/dL (ref 0.3–1.2)

## 2013-04-14 LAB — LIPID PANEL
Cholesterol: 136 mg/dL (ref 0–200)
HDL: 69 mg/dL (ref >39)
Triglycerides: 70 mg/dL (ref <150)
VLDL: 14 mg/dL (ref 0–40)

## 2013-04-14 LAB — HEMOGLOBIN A1C
Hgb A1c MFr Bld: 11.5 % — ABNORMAL HIGH (ref <5.7)
Mean Plasma Glucose: 283 mg/dL — ABNORMAL HIGH (ref <117)

## 2013-04-14 MED ORDER — MECLIZINE HCL 25 MG PO TABS: 25.0000 mg | ORAL_TABLET | Freq: Three times a day (TID) | ORAL | Status: DC | PRN

## 2013-04-14 MED ORDER — HYDROCODONE-ACETAMINOPHEN 5-325 MG PO TABS: 1.0000 | ORAL_TABLET | Freq: Four times a day (QID) | ORAL | Status: DC | PRN

## 2013-04-14 MED ORDER — FLUTICASONE PROPIONATE 50 MCG/ACT NA SUSP: 2.0000 | Freq: Every day | NASAL | Status: DC

## 2013-04-14 MED ORDER — ALPRAZOLAM 0.5 MG PO TABS: 0.5000 mg | ORAL_TABLET | Freq: Every evening | ORAL | Status: DC | PRN

## 2013-04-14 NOTE — Patient Instructions (Addendum)
We will call with lab results Take the flonase for sinuses/allergies Take meclizine as needed Continue all other medications You are on pain contract, meds must last entire month Referral back to endocrinology F/U 3 months

## 2013-04-14 NOTE — Assessment & Plan Note (Signed)
Treat with flonase

## 2013-04-14 NOTE — Assessment & Plan Note (Signed)
Her vertigo may be due to her current sinus drainage. I've given her some meclizine to use when necessary has a one time prescription.

## 2013-04-14 NOTE — Assessment & Plan Note (Signed)
She is on pain contract. I've given her 30 tablets of hydrocodone that she can use for the month. There is no other intervention that orthopedics can do for her. She does have residual effects of polio and no right lower leg. She also has some degenerative changes in her back

## 2013-04-14 NOTE — Assessment & Plan Note (Signed)
Check fasting lipid panel on statin therapy

## 2013-04-14 NOTE — Progress Notes (Signed)
  Subjective:    Patient ID: Kathy Hardy, female    DOB: Jun 06, 1956, 57 y.o.   MRN: [id number]  HPI  Patient here to follow chronic medical problems. She has multiple concerns. 1. spider bite to left elbow she was evaluated in the emergency one week ago she is finishing her Keflex 4 times a day. She has some numbness around that infection site but has not had any drainage for the past couple of days 2. Diabetes mellitus her blood sugars fasting have been 180-200 . In the evening her blood sugars have been 300s. She's taking Lantus 30 units. She has not followed up with endocrinology. She's not had any lab work done in the past 4 months. 3. Chronic back leg pain she was on tramadol however states she had side effects from the medication including making her sick, stomach she also saw an add on television and is now afraid to take the medication. 4. sinus pressure drainage watery eyes and ear pain for the past couple of weeks. She stated the ear makes her dizzy and things will start to spin. This is been going on for the past month .She denies any injury to her head.  Medications reviewed   Review of Systems  GEN- denies fatigue, fever, weight loss,weakness, recent illness HEENT- denies eye drainage, change in vision, +nasal discharge, CVS- denies chest pain, palpitations RESP- denies SOB, cough, wheeze ABD- denies N/V, change in stools, abd pain GU- denies dysuria, hematuria, dribbling, incontinence MSK- + joint pain, muscle aches, injury Neuro- denies headache, dizziness, syncope, seizure activity      Objective:   Physical Exam  GEN- NAD, alert and oriented x3 HEENT- PERRL, EOMI, non injected sclera, pink conjunctiva, MMM, oropharynx clear, no maxillary sinus tenderness, clear rhinorrhea, TM clear bilat no effusion Neck- Supple, no LAD, no bruit CVS- RRR, no murmur RESP-CTAB Skin- erythema and scab 2cm round on left elbow, no fluctuant areas, no induration, NT EXT- No  edema Pulses- Radial, DP- 2+ NEURO-CNII-XII in tact, weakness RLE compared to left, walks with cane      Assessment & Plan:

## 2013-04-14 NOTE — Assessment & Plan Note (Addendum)
Diabetes is uncontrolled. I will obtain A1c. She was unable to give urine sample one year Kathy Hardy her at next visit. She is on ACE inhibitor and statin drug. I will change her to 81 mg of aspirin. She will need to be reestablish with endocrinology but we will need labs before they can see her. For now continue current dose of Lantus  She has seen podiatry and ophthalmology. Her Medicaid does not cover the podiatrist nail clippings therefore she likely will not continue

## 2013-04-14 NOTE — Assessment & Plan Note (Signed)
Well controlled continue current medications  

## 2013-04-27 ENCOUNTER — Other Ambulatory Visit: Payer: Self-pay | Admitting: Family Medicine

## 2013-04-27 NOTE — Telephone Encounter (Signed)
Okay to refill? 

## 2013-04-27 NOTE — Telephone Encounter (Signed)
?   Ok to refill lasix,neurontin,and meclizine

## 2013-04-28 ENCOUNTER — Encounter: Payer: Self-pay | Admitting: Family Medicine

## 2013-04-28 NOTE — Telephone Encounter (Signed)
Refills sent

## 2013-04-30 DIAGNOSIS — I10 Essential (primary) hypertension: Secondary | ICD-10-CM

## 2013-04-30 DIAGNOSIS — G8929 Other chronic pain: Secondary | ICD-10-CM

## 2013-04-30 DIAGNOSIS — E119 Type 2 diabetes mellitus without complications: Secondary | ICD-10-CM

## 2013-04-30 DIAGNOSIS — M2580 Other specified joint disorders, unspecified joint: Secondary | ICD-10-CM

## 2013-05-06 ENCOUNTER — Encounter (INDEPENDENT_AMBULATORY_CARE_PROVIDER_SITE_OTHER): Payer: Medicaid Other | Admitting: Ophthalmology

## 2013-05-06 DIAGNOSIS — H3581 Retinal edema: Secondary | ICD-10-CM

## 2013-05-06 DIAGNOSIS — H43819 Vitreous degeneration, unspecified eye: Secondary | ICD-10-CM

## 2013-05-06 DIAGNOSIS — E11359 Type 2 diabetes mellitus with proliferative diabetic retinopathy without macular edema: Secondary | ICD-10-CM

## 2013-05-06 DIAGNOSIS — I10 Essential (primary) hypertension: Secondary | ICD-10-CM

## 2013-05-06 DIAGNOSIS — H35039 Hypertensive retinopathy, unspecified eye: Secondary | ICD-10-CM

## 2013-05-06 DIAGNOSIS — E1139 Type 2 diabetes mellitus with other diabetic ophthalmic complication: Secondary | ICD-10-CM

## 2013-05-12 ENCOUNTER — Other Ambulatory Visit: Payer: Self-pay | Admitting: Family Medicine

## 2013-05-18 ENCOUNTER — Telehealth: Payer: Self-pay | Admitting: Family Medicine

## 2013-05-18 MED ORDER — ALPRAZOLAM 0.5 MG PO TABS: 0.5000 mg | ORAL_TABLET | Freq: Every evening | ORAL | Status: DC | PRN

## 2013-05-18 MED ORDER — HYDROCODONE-ACETAMINOPHEN 5-325 MG PO TABS: 1.0000 | ORAL_TABLET | Freq: Four times a day (QID) | ORAL | Status: DC | PRN

## 2013-05-18 NOTE — Telephone Encounter (Signed)
?   Ok to refill, last refill 04/16/13 °

## 2013-05-18 NOTE — Telephone Encounter (Signed)
rx printed for provider signature. 

## 2013-05-18 NOTE — Telephone Encounter (Signed)
Patient needs her NORCO and Xanax .

## 2013-05-18 NOTE — Telephone Encounter (Signed)
Okay to refill, must pick up

## 2013-05-21 ENCOUNTER — Ambulatory Visit (INDEPENDENT_AMBULATORY_CARE_PROVIDER_SITE_OTHER): Payer: Medicaid Other | Admitting: Ophthalmology

## 2013-06-11 ENCOUNTER — Telehealth: Payer: Self-pay | Admitting: Family Medicine

## 2013-06-11 NOTE — Telephone Encounter (Signed)
Patient needs her NORCO , refill on xanax.

## 2013-06-11 NOTE — Telephone Encounter (Signed)
Pt can not pick up until 11/19

## 2013-06-16 MED ORDER — HYDROCODONE-ACETAMINOPHEN 5-325 MG PO TABS: 1.0000 | ORAL_TABLET | Freq: Four times a day (QID) | ORAL | Status: DC | PRN

## 2013-06-16 MED ORDER — ALPRAZOLAM 0.5 MG PO TABS: 0.5000 mg | ORAL_TABLET | Freq: Every evening | ORAL | Status: DC | PRN

## 2013-06-16 NOTE — Telephone Encounter (Signed)
Script printed, ready for provider signature and pt to pick up 

## 2013-06-17 ENCOUNTER — Telehealth: Payer: Self-pay | Admitting: Family Medicine

## 2013-06-17 NOTE — Telephone Encounter (Signed)
Script printed on 06/16/13, waiting on pt to pick up, tried calling both numbers listed no answer or voice mail

## 2013-06-17 NOTE — Telephone Encounter (Signed)
Patient needs her NORCO refilled

## 2013-06-26 ENCOUNTER — Telehealth: Payer: Self-pay | Admitting: Family Medicine

## 2013-06-26 NOTE — Telephone Encounter (Signed)
She needs a new glucose meter, needles and lancets called in to [facility] because her old meter is broken

## 2013-06-29 NOTE — Telephone Encounter (Signed)
Left message with pt to return my call as I have questions for her before I refill

## 2013-07-02 ENCOUNTER — Other Ambulatory Visit: Payer: Self-pay | Admitting: Family Medicine

## 2013-07-07 MED ORDER — LANCETS ULTRA THIN 30G MISC: 1.0000 [IU] | Freq: Two times a day (BID) | Status: DC

## 2013-07-07 MED ORDER — GLUCOSE BLOOD VI STRP: ORAL_STRIP | Status: DC

## 2013-07-07 MED ORDER — LIBERTY BLOOD GLUCOSE MONITOR DEVI: 1.0000 | Freq: Two times a day (BID) | Status: DC

## 2013-07-07 NOTE — Telephone Encounter (Signed)
Meds refilled.

## 2013-07-14 ENCOUNTER — Ambulatory Visit: Payer: Medicaid Other | Admitting: Family Medicine

## 2013-07-15 ENCOUNTER — Telehealth: Payer: Self-pay | Admitting: Family Medicine

## 2013-07-15 MED ORDER — ALPRAZOLAM 0.5 MG PO TABS: 0.5000 mg | ORAL_TABLET | Freq: Every evening | ORAL | Status: DC | PRN

## 2013-07-15 MED ORDER — HYDROCODONE-ACETAMINOPHEN 5-325 MG PO TABS: 1.0000 | ORAL_TABLET | Freq: Four times a day (QID) | ORAL | Status: DC | PRN

## 2013-07-15 NOTE — Telephone Encounter (Signed)
?   Ok to refill, last refill 06/16/13 on both, he said oxycodone but believe he meant hydrocodone

## 2013-07-15 NOTE — Telephone Encounter (Signed)
Okay to refill? 

## 2013-07-15 NOTE — Telephone Encounter (Signed)
Pt is needing xanax and oxycodone refilled Call back number is [phone]

## 2013-07-15 NOTE — Telephone Encounter (Signed)
Med refill

## 2013-07-16 ENCOUNTER — Telehealth: Payer: Self-pay | Admitting: *Deleted

## 2013-07-17 NOTE — Telephone Encounter (Signed)
LMTRC

## 2013-08-04 ENCOUNTER — Ambulatory Visit: Payer: Medicaid Other | Admitting: Family Medicine

## 2013-08-04 ENCOUNTER — Other Ambulatory Visit: Payer: Self-pay | Admitting: Family Medicine

## 2013-08-05 ENCOUNTER — Telehealth: Payer: Self-pay | Admitting: Family Medicine

## 2013-08-05 NOTE — Telephone Encounter (Signed)
Told pt she needed to be seen first and she stated that she does not have anyone to bring her in and wants to know if you can call somethng in for her.

## 2013-08-05 NOTE — Telephone Encounter (Signed)
Pharmacy is [facility] Pt is wanting prescription that everyone is getting for strep throat  Call back number is [phone]

## 2013-08-05 NOTE — Telephone Encounter (Signed)
NTBS.

## 2013-08-06 NOTE — Telephone Encounter (Signed)
Pt called and made aware NTBS

## 2013-08-12 ENCOUNTER — Ambulatory Visit: Payer: Medicaid Other | Admitting: Family Medicine

## 2013-08-12 ENCOUNTER — Encounter (INDEPENDENT_AMBULATORY_CARE_PROVIDER_SITE_OTHER): Payer: Medicaid Other | Admitting: Ophthalmology

## 2013-08-18 ENCOUNTER — Ambulatory Visit: Payer: Medicaid Other | Admitting: Family Medicine

## 2013-08-20 ENCOUNTER — Encounter (INDEPENDENT_AMBULATORY_CARE_PROVIDER_SITE_OTHER): Payer: Medicaid Other | Admitting: Ophthalmology

## 2013-08-20 DIAGNOSIS — H43819 Vitreous degeneration, unspecified eye: Secondary | ICD-10-CM

## 2013-08-20 DIAGNOSIS — E1139 Type 2 diabetes mellitus with other diabetic ophthalmic complication: Secondary | ICD-10-CM

## 2013-08-20 DIAGNOSIS — E1165 Type 2 diabetes mellitus with hyperglycemia: Secondary | ICD-10-CM

## 2013-08-20 DIAGNOSIS — H3581 Retinal edema: Secondary | ICD-10-CM

## 2013-08-20 DIAGNOSIS — I10 Essential (primary) hypertension: Secondary | ICD-10-CM

## 2013-08-20 DIAGNOSIS — E11359 Type 2 diabetes mellitus with proliferative diabetic retinopathy without macular edema: Secondary | ICD-10-CM

## 2013-08-20 DIAGNOSIS — H35039 Hypertensive retinopathy, unspecified eye: Secondary | ICD-10-CM

## 2013-08-21 ENCOUNTER — Ambulatory Visit: Payer: Medicaid Other | Admitting: Family Medicine

## 2013-08-26 ENCOUNTER — Other Ambulatory Visit: Payer: Self-pay | Admitting: Family Medicine

## 2013-08-28 ENCOUNTER — Ambulatory Visit: Payer: Medicaid Other | Admitting: Family Medicine

## 2013-08-28 NOTE — Telephone Encounter (Signed)
Medication refilled per protocol. 

## 2013-09-04 ENCOUNTER — Other Ambulatory Visit: Payer: Self-pay | Admitting: Gastroenterology

## 2013-09-04 ENCOUNTER — Other Ambulatory Visit: Payer: Self-pay | Admitting: Family Medicine

## 2013-09-14 ENCOUNTER — Encounter: Payer: Self-pay | Admitting: Family Medicine

## 2013-09-14 ENCOUNTER — Ambulatory Visit (INDEPENDENT_AMBULATORY_CARE_PROVIDER_SITE_OTHER): Payer: Medicaid Other | Admitting: Family Medicine

## 2013-09-14 VITALS — BP 110/80 | HR 80 | Temp 97.6°F | Resp 18 | Ht 61.0 in | Wt 160.0 lb

## 2013-09-14 DIAGNOSIS — Z91148 Patient's other noncompliance with medication regimen for other reason: Secondary | ICD-10-CM | POA: Insufficient documentation

## 2013-09-14 DIAGNOSIS — Z23 Encounter for immunization: Secondary | ICD-10-CM

## 2013-09-14 DIAGNOSIS — Z9114 Patient's other noncompliance with medication regimen: Secondary | ICD-10-CM

## 2013-09-14 DIAGNOSIS — Z9119 Patient's noncompliance with other medical treatment and regimen: Secondary | ICD-10-CM

## 2013-09-14 DIAGNOSIS — M89669 Osteopathy after poliomyelitis, unspecified lower leg: Secondary | ICD-10-CM

## 2013-09-14 DIAGNOSIS — E1139 Type 2 diabetes mellitus with other diabetic ophthalmic complication: Secondary | ICD-10-CM

## 2013-09-14 DIAGNOSIS — I10 Essential (primary) hypertension: Secondary | ICD-10-CM

## 2013-09-14 DIAGNOSIS — G8929 Other chronic pain: Secondary | ICD-10-CM

## 2013-09-14 DIAGNOSIS — E113599 Type 2 diabetes mellitus with proliferative diabetic retinopathy without macular edema, unspecified eye: Secondary | ICD-10-CM

## 2013-09-14 DIAGNOSIS — Z91199 Patient's noncompliance with other medical treatment and regimen due to unspecified reason: Secondary | ICD-10-CM

## 2013-09-14 DIAGNOSIS — B91 Sequelae of poliomyelitis: Secondary | ICD-10-CM

## 2013-09-14 DIAGNOSIS — E118 Type 2 diabetes mellitus with unspecified complications: Secondary | ICD-10-CM

## 2013-09-14 DIAGNOSIS — E11359 Type 2 diabetes mellitus with proliferative diabetic retinopathy without macular edema: Secondary | ICD-10-CM

## 2013-09-14 DIAGNOSIS — A809 Acute poliomyelitis, unspecified: Secondary | ICD-10-CM

## 2013-09-14 DIAGNOSIS — E785 Hyperlipidemia, unspecified: Secondary | ICD-10-CM

## 2013-09-14 DIAGNOSIS — R079 Chest pain, unspecified: Secondary | ICD-10-CM

## 2013-09-14 DIAGNOSIS — Z79899 Other long term (current) drug therapy: Secondary | ICD-10-CM

## 2013-09-14 LAB — CBC WITH DIFFERENTIAL/PLATELET
BASOS ABS: 0 10*3/uL (ref 0.0–0.1)
Basophils Relative: 1 % (ref 0–1)
EOS PCT: 2 % (ref 0–5)
Eosinophils Absolute: 0.1 10*3/uL (ref 0.0–0.7)
HEMATOCRIT: 40.8 % (ref 36.0–46.0)
HEMOGLOBIN: 13.4 g/dL (ref 12.0–15.0)
LYMPHS ABS: 2.5 10*3/uL (ref 0.7–4.0)
LYMPHS PCT: 57 % — AB (ref 12–46)
MCH: 29.9 pg (ref 26.0–34.0)
MCHC: 32.8 g/dL (ref 30.0–36.0)
MCV: 91.1 fL (ref 78.0–100.0)
MONO ABS: 0.3 10*3/uL (ref 0.1–1.0)
MONOS PCT: 8 % (ref 3–12)
NEUTROS ABS: 1.4 10*3/uL (ref 1.7–7.7)
Neutrophils Relative %: 32 % — ABNORMAL LOW (ref 43–77)
Platelets: 182 10*3/uL (ref 150–400)
RBC: 4.48 MIL/uL (ref 3.87–5.11)
RDW: 13.6 % (ref 11.5–15.5)
WBC: 4.3 10*3/uL (ref 4.0–10.5)

## 2013-09-14 LAB — HEMOGLOBIN A1C
HEMOGLOBIN A1C: 12 % — AB (ref <5.7)
Mean Plasma Glucose: 298 mg/dL — ABNORMAL HIGH (ref <117)

## 2013-09-14 LAB — COMPREHENSIVE METABOLIC PANEL
ALT: 21 U/L (ref 0–35)
AST: 25 U/L (ref 0–37)
Albumin: 4.3 g/dL (ref 3.5–5.2)
Alkaline Phosphatase: 97 U/L (ref 39–117)
BUN: 17 mg/dL (ref 6–23)
CALCIUM: 9.4 mg/dL (ref 8.4–10.5)
CHLORIDE: 101 meq/L (ref 96–112)
CO2: 31 meq/L (ref 19–32)
Creat: 0.85 mg/dL (ref 0.50–1.10)
Glucose, Bld: 38 mg/dL — CL (ref 70–99)
POTASSIUM: 3.6 meq/L (ref 3.5–5.3)
Sodium: 143 mEq/L (ref 135–145)
Total Bilirubin: 0.4 mg/dL (ref 0.2–1.2)
Total Protein: 6.7 g/dL (ref 6.0–8.3)

## 2013-09-14 LAB — LIPID PANEL
CHOLESTEROL: 138 mg/dL (ref 0–200)
HDL: 56 mg/dL (ref >39)
LDL Cholesterol: 66 mg/dL (ref 0–99)
Total CHOL/HDL Ratio: 2.5 Ratio
Triglycerides: 79 mg/dL (ref <150)
VLDL: 16 mg/dL (ref 0–40)

## 2013-09-14 LAB — TSH: TSH: 2.557 u[IU]/mL (ref 0.350–4.500)

## 2013-09-14 MED ORDER — HYDROCODONE-ACETAMINOPHEN 5-325 MG PO TABS: 1.0000 | ORAL_TABLET | Freq: Four times a day (QID) | ORAL | Status: DC | PRN

## 2013-09-14 MED ORDER — ALPRAZOLAM 0.5 MG PO TABS
0.5000 mg | ORAL_TABLET | Freq: Every evening | ORAL | Status: DC | PRN
Start: 2013-09-14 — End: 2013-11-18

## 2013-09-14 NOTE — Assessment & Plan Note (Signed)
I discussed the importance of coming to her appointments as well as checking her blood sugars and taking her medications as prescribed. I've advised her that she does not fall through with her medication regimen and continues to miss appointments that she will be dismissed from our clinic as I cannot provide good continuity of care in this setting

## 2013-09-14 NOTE — Assessment & Plan Note (Addendum)
EKG- RBBB, unchanged, extensive cardiac work up with multiple caths and lexiview scans being normal Plavix also changed to brand name, if chest pain continues will refer to cardiology again

## 2013-09-14 NOTE — Assessment & Plan Note (Signed)
Poor glucose control due to non compliance, continues to follow with eye doctor

## 2013-09-14 NOTE — Assessment & Plan Note (Signed)
Lift chair and shower bench sent

## 2013-09-14 NOTE — Assessment & Plan Note (Signed)
Urine drug screen

## 2013-09-14 NOTE — Assessment & Plan Note (Signed)
Check FLP , on crestor

## 2013-09-14 NOTE — Progress Notes (Signed)
Patient ID: HAIDEE STOGSDILL, female   DOB: Aug 26, 1955, 58 y.o.   MRN: [id number]   Subjective:    Patient ID: FUSAKO TANABE, female    DOB: [birth year], 58 y.o.   MRN: [id number]  Patient presents for diabetic check  patient here to follow chronic medical problems. She's had multiple cancellations and missed appointment since her last visit back in September. She's not been checking her blood sugars states that she needs a new meter she has been giving herself Lantus 30 units per report. She states she's been trying to watch her diet to help control her sugar. She's not had any hypoglycemic symptoms. She never went to endocrinology appointment.  She complains pain everywhere from her lower back to her knees to her arms and states that she has to take a lot of her pain medication. She also requests a lift chair and a shower stool for her apartment.  She is due for fasting labs today as well as Pneumovax  She also complains of chest pain over the weekend states that she took the generic brand of Plavix and had pain right afterwards. The pain was substernal went down her left arm but she did not go to the emergency room there was no diaphoresis or shortness of breath associated. She's not had any further chest pain.   Review Of Systems:  GEN- denies fatigue, fever, weight loss,weakness, recent illness HEENT- denies eye drainage, change in vision, nasal discharge, CVS- +chest pain, palpitations RESP- denies SOB, cough, wheeze ABD- denies N/V, change in stools, abd pain GU- denies dysuria, hematuria, dribbling, incontinence MSK- + joint pain, muscle aches, injury Neuro- denies headache, dizziness, syncope, seizure activity       Objective:    BP 110/80  Pulse 80  Temp(Src) 97.6 F (36.4 C) (Oral)  Resp 18  Ht 5\' 1"  (1.549 m)  Wt 160 lb (72.576 kg)  BMI 30.25 kg/m2 GEN- NAD, alert and oriented x3, walks with cane HEENT- PERRL, EOMI, non injected sclera, pink conjunctiva, MMM,  oropharynx clear Neck- Supple, CVS- RRR, no murmur RESP-CTAB ABD-NABS,soft,NT,ND EXT- No edema Pulses- Radial, DP- 2+   EKG- NSR, RBBB, unchanged frtom 2013     Assessment & Plan:      Problem List Items Addressed This Visit   None      Note: This dictation was prepared with Dragon dictation along with smaller phrase technology. Any transcriptional errors that result from this process are unintentional.

## 2013-09-14 NOTE — Assessment & Plan Note (Signed)
Uncontrolled DM New meter given Check A1C ON acei , STATIN DRUG Pneumonia vaccine given

## 2013-09-14 NOTE — Patient Instructions (Signed)
Continue your medications Check your blood sugar before breakfast, lunch and dinner I sent a prescription for new meter, lift chair, diabetic shoes F/U 4 weeks- Bring your METER and ALL of your medications

## 2013-09-16 MED ORDER — INSULIN ASPART 100 UNIT/ML FLEXPEN: PEN_INJECTOR | SUBCUTANEOUS | Status: DC

## 2013-09-16 NOTE — Addendum Note (Signed)
Addended by: Vic Blackbird F on: 09/16/2013 11:07 AM   Modules accepted: Orders

## 2013-09-18 ENCOUNTER — Ambulatory Visit: Payer: Medicaid Other | Admitting: Family Medicine

## 2013-09-22 ENCOUNTER — Other Ambulatory Visit: Payer: Self-pay | Admitting: Family Medicine

## 2013-09-22 NOTE — Telephone Encounter (Signed)
Refill appropriate and filled per protocol. 

## 2013-09-28 ENCOUNTER — Telehealth: Payer: Self-pay | Admitting: Family Medicine

## 2013-09-28 NOTE — Telephone Encounter (Signed)
noted 

## 2013-09-28 NOTE — Telephone Encounter (Signed)
BP today 170/105 by [facility] nurse now.  Has taken meds today.  Told by [facility]  Nurse to let you know.

## 2013-09-28 NOTE — Telephone Encounter (Signed)
Call placed to patient.   Stated that she has taken all medications. Advised to take Novasc 5mg  now.   Patient stated that she feels fine except for a headache.   Appointment scheduled for Monday 10/05/2013 @ 2:45pm.

## 2013-09-28 NOTE — Telephone Encounter (Signed)
Call pt back and make sure she has taken all of her medications for blood pressure  If so Have her take another 5mg  dose of the norvasc now   Schedule appt in office for her blood pressure

## 2013-10-01 IMAGING — NM NM MYOCAR SINGLE W/SPECT W/WALL MOTION & EF
2 series · 12 of 12 positions shown · non-contrast
Comparison: none

nm myoview pharmacologic stress

Ordering Physician: JAYLON AUJLA
[place] Physician: [REDACTED]al Data: 55-year-old woman with chest pain.
NUCLEAR MEDICINE ADENOSINE STRESS MYOVIEW STUDY WITH SPECT AND LEFT
VENTRIUCLAR EJECTION FRACTION
Radionuclide Data: One-day rest/stress protocol performed with
[DATE] mCi of Uc-XXm Myoview.
Stress Data: Regadenoson infusion resulted in nausea and flushing.
There was a modest and typical increase in heart rate and a fairly
pronounced decrease in systolic blood pressure with drug
administration.  No arrhythmias noted.
EKG: Normal sinus rhythm; left atrial abnormality; probable LVH;
nondiagnostic inferolateral Q-waves.  No significant change
following Regadenoson.
Scintigraphic Data: Acquisition was notable for significant
movement during the stress portion of the study.  Mild breast
attenuation was present.  Colonic activity overlapped the inferior
wall during stress imaging.  On tomographic images reconstructed in
standard planes, there was uniform and normal uptake of tracer
throughout.  The resting images were unchanged.  The gated
reconstruction demonstrated normal regional and global LV systolic
function as well as normal systolic accentuation of activity in all
myocardial segments.  Estimated ejection fraction was 66%.

[Series 1: cs cardiac tc hi dose · 6.41mm/px · 6 of 512 frames shown]
[frame 43/512]
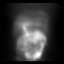
[frame 128/512]
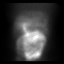
[frame 214/512]
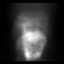
[frame 299/512]
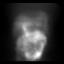
[frame 384/512]
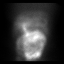
[frame 470/512]
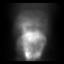

[Series 1: cr cardiac tc low dose · 6.41mm/px · 6 of 64 frames shown]
[frame 6/64]
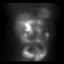
[frame 16/64]
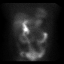
[frame 27/64]
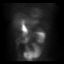
[frame 38/64]
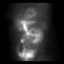
[frame 48/64]
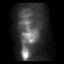
[frame 59/64]
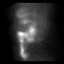

[12 of 12 positions shown; findings below may reference images not displayed]

IMPRESSION: Negative pharmacologic stress nuclear myocardial study revealing no
stress induced EKG abnormalities, normal left ventricular size,
normal left ventricular systolic function and normal myocardial
perfusion.  Other findings as noted.

## 2013-10-05 ENCOUNTER — Ambulatory Visit: Payer: Self-pay | Admitting: Family Medicine

## 2013-10-12 ENCOUNTER — Ambulatory Visit: Payer: Medicaid Other | Admitting: Family Medicine

## 2013-10-13 ENCOUNTER — Ambulatory Visit (INDEPENDENT_AMBULATORY_CARE_PROVIDER_SITE_OTHER): Payer: Medicaid Other | Admitting: Family Medicine

## 2013-10-13 ENCOUNTER — Encounter: Payer: Self-pay | Admitting: Family Medicine

## 2013-10-13 VITALS — BP 130/70 | HR 64 | Temp 97.4°F | Resp 16 | Ht 61.5 in | Wt 161.0 lb

## 2013-10-13 DIAGNOSIS — E118 Type 2 diabetes mellitus with unspecified complications: Secondary | ICD-10-CM

## 2013-10-13 DIAGNOSIS — R42 Dizziness and giddiness: Secondary | ICD-10-CM

## 2013-10-13 DIAGNOSIS — I10 Essential (primary) hypertension: Secondary | ICD-10-CM

## 2013-10-13 MED ORDER — HYDROCODONE-ACETAMINOPHEN 5-325 MG PO TABS: 1.0000 | ORAL_TABLET | Freq: Four times a day (QID) | ORAL | Status: DC | PRN

## 2013-10-13 NOTE — Patient Instructions (Addendum)
Buy a new meter-- [employer] brand from [employer] Continue current medications Blood pressure looks good Decrease Lantus to 35units, continue Novolog 5 units with each meal F/U 2 months

## 2013-10-13 NOTE — Assessment & Plan Note (Addendum)
She does not have her meter with her but states that she did have some hypoglycemia with 40 units of Lantus therefore will decrease her to 35 units and she will continue 5 units NovoLog with her meals Discussed importance of glucose monitoring. She will get a new meter this week until then she'll use her mothers. Discussed the importance of compliance.

## 2013-10-13 NOTE — Assessment & Plan Note (Signed)
States her symptoms have improved. She is on the brand name Plavix or not sure if this is making any difference. I also discussed the proper use of the pain medication and that this can cause drowsiness as well. He does have a history of vertigo but this does not appear to be a vertigo spell

## 2013-10-13 NOTE — Progress Notes (Signed)
Patient ID: Kathy Hardy, female   DOB: [birth year], 58 y.o.   MRN: [id number]   Subjective:    Patient ID: Kathy Hardy, female    DOB: 12-Dec-1955, 58 y.o.   MRN: [id number]  Patient presents for 4 week F/U and Dizziness  patient here to followup diabetes and hypertension. Her home health nurse called because her blood pressure was quite elevated that same day she found out that her brother was arrested do to robbery. She did take an extra amlodipine as I prescribed her blood pressure improved back to baseline. She does complain of dizziness which she states is no better that she has the brand name Plavix she also noted some dizziness after she takes her pain medication. She has not slept very well because of her brother who was recently arrested as well as the family members who have been very sick and in and out of the hospital.  Diabetes mellitus-not bring her meter today her last A1c was up to 12%. She states that when she used to 40 units of Lantus 25 units of NovoLog her blood sugar went drop in the morning time she would feel sick. More recently she has been giving herself 30 units she initially states that her blood sugars were looking better but then noted that she did not have a meter note we did send a new meter to her pharmacy however it has not been a year therefore she was supposed to pay out of pocket to get a meter which she did not do. She did check her blood sugar on her mother's meter this week and states it was 200 in the morning.    Review Of Systems:  GEN- denies fatigue, fever, weight loss,weakness, recent illness HEENT- denies eye drainage, change in vision, nasal discharge, CVS- denies chest pain, palpitations RESP- denies SOB, cough, wheeze ABD- denies N/V, change in stools, abd pain GU- denies dysuria, hematuria, dribbling, incontinence MSK- + joint pain, muscle aches, injury Neuro- denies headache,+ dizziness, syncope, seizure activity       Objective:     BP 130/70  Pulse 64  Temp(Src) 97.4 F (36.3 C)  Resp 16  Ht 5' 1.5" (1.562 m)  Wt 161 lb (73.029 kg)  BMI 29.93 kg/m2 GEN- NAD, alert and oriented x3 HEENT- PERRL, EOMI, non injected sclera, pink conjunctiva, MMM, oropharynx clear CVS- RRR, no murmur RESP-CTAB Neuro- CNII-XII in tact, no new deficits, walks with cane, limp to right leg EXT- No edema        Assessment & Plan:      Problem List Items Addressed This Visit   Essential hypertension, benign     Her blood pressure looks good today. Typically we do not have much difficulty with her blood pressure. I will not change her regimen this seems she was very upset today that her blood pressure spiked a regarding her brother    Dizziness and giddiness - Primary     States her symptoms have improved. She is on the brand name Plavix or not sure if this is making any difference. I also discussed the proper use of the pain medication and that this can cause drowsiness as well. He does have a history of vertigo but this does not appear to be a vertigo spell    Diabetes mellitus with complication     She does not have her meter with her but states that she did have some hypoglycemia with 40 units of Lantus therefore will decrease her  to 35 units and she will continue 5 units NovoLog with her meals       Note: This dictation was prepared with Dragon dictation along with smaller phrase technology. Any transcriptional errors that result from this process are unintentional.

## 2013-10-13 NOTE — Assessment & Plan Note (Signed)
Her blood pressure looks good today. Typically we do not have much difficulty with her blood pressure. I will not change her regimen this seems she was very upset today that her blood pressure spiked a regarding her brother

## 2013-10-14 LAB — MICROALBUMIN / CREATININE URINE RATIO
Creatinine, Urine: 142.7 mg/dL
MICROALB UR: 14.81 mg/dL — AB (ref 0.00–1.89)
Microalb Creat Ratio: 103.8 mg/g — ABNORMAL HIGH (ref 0.0–30.0)

## 2013-10-16 LAB — PRESCRIPTION MONITORING PROFILE (13 PANEL)
AMPHETAMINE/METH: NEGATIVE ng/mL
BUPRENORPHINE, URINE: NEGATIVE ng/mL
Barbiturate Screen, Urine: NEGATIVE ng/mL
CANNABINOID SCRN UR: NEGATIVE ng/mL
Creatinine, Urine: 138.2 mg/dL (ref >20.0)
FENTANYL URINE: NEGATIVE ng/mL
Meperidine, Ur: NEGATIVE ng/mL
Methadone Screen, Urine: NEGATIVE ng/mL
NITRITES URINE, INITIAL: NEGATIVE ug/mL
Oxycodone Screen, Ur: NEGATIVE ng/mL
Propoxyphene: NEGATIVE ng/mL
Tramadol Scrn, Ur: NEGATIVE ng/mL
pH, Initial: 5.2 pH (ref 4.5–8.9)

## 2013-10-16 LAB — OPIATES/OPIOIDS (LC/MS-MS)
Codeine Urine: NEGATIVE ng/mL
HYDROCODONE: 1525 ng/mL — AB
Heroin (6-AM), UR: NEGATIVE ng/mL
Hydromorphone: 72 ng/mL — AB
MORPHINE: NEGATIVE ng/mL
NORHYDROCODONE, UR: 603 ng/mL — AB
Noroxycodone, Ur: NEGATIVE ng/mL
OXYMORPHONE, URINE: NEGATIVE ng/mL
Oxycodone, ur: NEGATIVE ng/mL

## 2013-10-16 LAB — BENZODIAZEPINES (GC/LC/MS), URINE
ALPRAZOLAMU: 141 ng/mL — AB
Clonazepam metabolite (GC/LC/MS), ur confirm: NEGATIVE ng/mL
Diazepam (GC/LC/MS), ur confirm: NEGATIVE ng/mL
Estazolam (GC/LC/MS), ur confirm: NEGATIVE ng/mL
Flunitrazepam metabolite (GC/LC/MS), ur confirm: NEGATIVE ng/mL
Flurazepam metabolite (GC/LC/MS), ur confirm: NEGATIVE ng/mL
HYDROXYALPRAZ UR: NEGATIVE ng/mL
Lorazepam (GC/LC/MS), ur confirm: NEGATIVE ng/mL
MIDAZOLAMU: NEGATIVE ng/mL
Nordiazepam (GC/LC/MS), ur confirm: NEGATIVE ng/mL
Oxazepam (GC/LC/MS), ur confirm: NEGATIVE ng/mL
TRIAZOLAMU: NEGATIVE ng/mL
Temazepam (GC/LC/MS), ur confirm: NEGATIVE ng/mL

## 2013-10-16 LAB — COCAINE METABOLITE (GC/LC/MS), URINE: BENZOYLECGONINE GC/MS CONF: 895 ng/mL — AB

## 2013-10-20 ENCOUNTER — Encounter: Payer: Self-pay | Admitting: Family Medicine

## 2013-10-21 ENCOUNTER — Ambulatory Visit: Payer: Self-pay | Admitting: Family Medicine

## 2013-10-28 ENCOUNTER — Other Ambulatory Visit: Payer: Self-pay | Admitting: Family Medicine

## 2013-10-28 NOTE — Telephone Encounter (Signed)
Refill appropriate and filled per protocol. 

## 2013-11-18 ENCOUNTER — Telehealth: Payer: Self-pay | Admitting: Family Medicine

## 2013-11-18 MED ORDER — ALPRAZOLAM 0.5 MG PO TABS: 0.5000 mg | ORAL_TABLET | Freq: Every evening | ORAL | Status: DC | PRN

## 2013-11-18 NOTE — Telephone Encounter (Signed)
Call back number is [phone] Pt is needing a refill on xanax and HYDROcodone-acetaminophen (NORCO) 5-325 MG per tablet Pharmacy [facility]

## 2013-11-18 NOTE — Telephone Encounter (Signed)
Ok to refill??  Last office visit 10/13/2013.  Last refill 10/13/2013 for Hydrocodone, 09/15/2013 for Xanax.

## 2013-11-18 NOTE — Telephone Encounter (Signed)
Call placed to patient and patient made aware.   Appointment re-scheduled for Friday, 11/20/2013 @ 12pm to discuss labs.

## 2013-11-18 NOTE — Telephone Encounter (Signed)
Pt failed drug screen, therefore no Hydrocodone can be refilled Okay to refill the xanax x 1 She needs to make an appt to discuss her care

## 2013-11-19 ENCOUNTER — Telehealth: Payer: Self-pay | Admitting: *Deleted

## 2013-11-19 NOTE — Telephone Encounter (Signed)
Received call from patient.   Reports that she has lost prescriptions for shower chair and diabetic shoes.   Requested scripts be faxed to [facility].   Noted orders have been scanned in.   Prescription faxed.

## 2013-11-20 ENCOUNTER — Ambulatory Visit (INDEPENDENT_AMBULATORY_CARE_PROVIDER_SITE_OTHER): Payer: Medicaid Other | Admitting: Family Medicine

## 2013-11-20 ENCOUNTER — Ambulatory Visit (HOSPITAL_COMMUNITY)
Admission: RE | Admit: 2013-11-20 | Discharge: 2013-11-20 | Disposition: A | Discharge status: Home / Self Care | Payer: Medicaid Other | Source: Ambulatory Visit | Attending: Family Medicine | Admitting: Family Medicine

## 2013-11-20 VITALS — BP 128/64 | HR 82 | Temp 97.7°F | Resp 16 | Ht 60.0 in | Wt 169.0 lb

## 2013-11-20 DIAGNOSIS — T17900A Unspecified foreign body in respiratory tract, part unspecified causing asphyxiation, initial encounter: Secondary | ICD-10-CM

## 2013-11-20 DIAGNOSIS — R6889 Other general symptoms and signs: Secondary | ICD-10-CM | POA: Insufficient documentation

## 2013-11-20 DIAGNOSIS — Z91148 Patient's other noncompliance with medication regimen for other reason: Secondary | ICD-10-CM

## 2013-11-20 DIAGNOSIS — Z9114 Patient's other noncompliance with medication regimen: Secondary | ICD-10-CM

## 2013-11-20 DIAGNOSIS — F411 Generalized anxiety disorder: Secondary | ICD-10-CM

## 2013-11-20 DIAGNOSIS — T17908A Unspecified foreign body in respiratory tract, part unspecified causing other injury, initial encounter: Secondary | ICD-10-CM | POA: Insufficient documentation

## 2013-11-20 DIAGNOSIS — J309 Allergic rhinitis, unspecified: Secondary | ICD-10-CM

## 2013-11-20 DIAGNOSIS — F1910 Other psychoactive substance abuse, uncomplicated: Secondary | ICD-10-CM

## 2013-11-20 DIAGNOSIS — T17308A Unspecified foreign body in larynx causing other injury, initial encounter: Secondary | ICD-10-CM

## 2013-11-20 DIAGNOSIS — Z9119 Patient's noncompliance with other medical treatment and regimen: Secondary | ICD-10-CM

## 2013-11-20 DIAGNOSIS — K224 Dyskinesia of esophagus: Secondary | ICD-10-CM | POA: Insufficient documentation

## 2013-11-20 DIAGNOSIS — Z91199 Patient's noncompliance with other medical treatment and regimen due to unspecified reason: Secondary | ICD-10-CM

## 2013-11-20 DIAGNOSIS — K219 Gastro-esophageal reflux disease without esophagitis: Secondary | ICD-10-CM | POA: Insufficient documentation

## 2013-11-20 DIAGNOSIS — E118 Type 2 diabetes mellitus with unspecified complications: Secondary | ICD-10-CM

## 2013-11-20 MED ORDER — LEVOCETIRIZINE DIHYDROCHLORIDE 5 MG PO TABS: 5.0000 mg | ORAL_TABLET | Freq: Every evening | ORAL | Status: DC

## 2013-11-20 MED ORDER — FLUOXETINE HCL 40 MG PO CAPS: 40.0000 mg | ORAL_CAPSULE | Freq: Every day | ORAL | Status: DC

## 2013-11-20 NOTE — Assessment & Plan Note (Signed)
Increase Prozac to 40 mg once a day I would not change her benzodiazepine

## 2013-11-20 NOTE — Assessment & Plan Note (Signed)
I will change her to Xyzal she will continue the [facility]

## 2013-11-20 NOTE — Patient Instructions (Addendum)
Continue flonase 2 sprays daily Prozac increased to 40mg  for anxiety and nerves Try new allergy medicine- XYZAL Referral to endocrinology  Diabetic shoe form sent in F/U 3 MONTHS

## 2013-11-20 NOTE — Assessment & Plan Note (Signed)
Marion R. esophagus did not show any foreign body or stricture. However she does have abnormal swallowing mechanism or for her to gastroenterology to have this evaluated

## 2013-11-20 NOTE — Assessment & Plan Note (Signed)
Discussed the importance of her compliance with her medication regimen as well as her appointments and referrals. She does not follow through with any incident she will be dismissed from my clinic.

## 2013-11-20 NOTE — Assessment & Plan Note (Signed)
She denies the use of the cocaine

## 2013-11-20 NOTE — Progress Notes (Signed)
Patient ID: Kathy Hardy, female   DOB: 06-04-1956, 58 y.o.   MRN: [id number]   Subjective:    Patient ID: Kathy Hardy, female    DOB: [birth year], 58 y.o.   MRN: [id number]  Patient presents for discuss labs, Back pain and Sore throat  She'll followup her labs and drug screen. Her diabetes mellitus uncontrolled and A1c of 11%. She states that she is taking her injections as prescribed. He also requests diabetic shoes form be completed   She was here with her a started off the conversation stating that her hydrocodone pain pills were not strong enough and that she was in a lot of pain all the time and that her nerves are bad and that she needed more Xanax to help. Note her urine drug screen was positive for Xanax as well as narcotics and cocaine. Patient denies using any cocaine but states that she was around it.  She's been having nasal congestion occasional drainage she does use Flonase she also has itchy eyesand sneezing and is taking Zyrtec but this is not helping.  She's also had some difficulty swallowing the past week. States she was eating some pizza last week and thought there may of been a piece of glass on it and she feels like there is something stuck in her throat all of her pills in her feet are getting stuck as they moved down. She's not had any hemoptysis   Review Of Systems:  GEN- denies fatigue, fever, weight loss,weakness, recent illness HEENT- denies eye drainage, change in vision, nasal discharge, CVS- denies chest pain, palpitations RESP- denies SOB, cough, wheeze ABD- denies N/V, change in stools, abd pain GU- denies dysuria, hematuria, dribbling, incontinence MSK- + joint pain, muscle aches, injury Neuro- denies headache, dizziness, syncope, seizure activity       Objective:    BP 128/64  Pulse 82  Temp(Src) 97.7 F (36.5 C) (Oral)  Resp 16  Ht 5' (1.524 m)  Wt 169 lb (76.658 kg)  BMI 33.01 kg/m2 GEN- NAD, alert and oriented x3 HEENT- PERRL, EOMI,  non injected sclera, pink conjunctiva, MMM, oropharynx clear, no maxillary sinus tenderness ,nares clear rhinorrhea Neck- Supple, no LAD CVS- RRR, no murmur RESP-CTAB EXT- No edema Pulses- Radial, DP- 2+        Assessment & Plan:      Problem List Items Addressed This Visit   None    Visit Diagnoses   Foreign body aspiration    -  Primary    Relevant Orders       DG Esophagus (Completed)       Note: This dictation was prepared with Dragon dictation along with smaller phrase technology. Any transcriptional errors that result from this process are unintentional.

## 2013-11-20 NOTE — Assessment & Plan Note (Signed)
Uncontrolled diabetes mellitus she'll be referred to endocrinology she has difficulties with compliance. I've advised her that if she missed an appointment with myself or specialist I will refer her to that I will dismiss her based on her behavior.

## 2013-11-23 ENCOUNTER — Encounter: Payer: Self-pay | Admitting: Gastroenterology

## 2013-11-23 ENCOUNTER — Encounter: Payer: Self-pay | Admitting: Internal Medicine

## 2013-11-23 ENCOUNTER — Other Ambulatory Visit: Payer: Self-pay | Admitting: *Deleted

## 2013-11-23 ENCOUNTER — Telehealth: Payer: Self-pay | Admitting: *Deleted

## 2013-11-23 DIAGNOSIS — R1310 Dysphagia, unspecified: Secondary | ICD-10-CM

## 2013-11-23 MED ORDER — PANTOPRAZOLE SODIUM 40 MG PO TBEC: DELAYED_RELEASE_TABLET | ORAL | Status: DC

## 2013-11-23 NOTE — Telephone Encounter (Signed)
Received e-mail requesting PA for Xyzal.   PA submitted.

## 2013-11-23 NOTE — Telephone Encounter (Signed)
Refill appropriate and filled per protocol. 

## 2013-11-26 ENCOUNTER — Other Ambulatory Visit: Payer: Self-pay | Admitting: Family Medicine

## 2013-11-26 NOTE — Telephone Encounter (Signed)
Refill appropriate and filled per protocol. 

## 2013-11-27 ENCOUNTER — Other Ambulatory Visit: Payer: Self-pay | Admitting: Family Medicine

## 2013-11-27 NOTE — Telephone Encounter (Signed)
Refill appropriate and filled per protocol. 

## 2013-12-01 ENCOUNTER — Ambulatory Visit (INDEPENDENT_AMBULATORY_CARE_PROVIDER_SITE_OTHER): Payer: Medicaid Other | Admitting: Gastroenterology

## 2013-12-01 ENCOUNTER — Other Ambulatory Visit: Payer: Self-pay | Admitting: Gastroenterology

## 2013-12-01 ENCOUNTER — Encounter: Payer: Self-pay | Admitting: Gastroenterology

## 2013-12-01 VITALS — BP 132/74 | HR 78 | Temp 98.2°F | Ht 62.0 in | Wt 169.0 lb

## 2013-12-01 DIAGNOSIS — R1310 Dysphagia, unspecified: Secondary | ICD-10-CM | POA: Insufficient documentation

## 2013-12-01 MED ORDER — HYOSCYAMINE SULFATE 0.125 MG SL SUBL: 0.1250 mg | SUBLINGUAL_TABLET | SUBLINGUAL | Status: DC | PRN

## 2013-12-01 NOTE — Patient Instructions (Signed)
We have scheduled you for an upper endoscopy with dilation if needed with Dr. Oneida Alar.  I have also referred you to a speech therapist for further assessment of your swallowing.

## 2013-12-01 NOTE — Progress Notes (Signed)
Referring Provider: Alycia Rossetti, MD Primary Care Physician:  Vic Blackbird, MD Primary GI: Dr. Oneida Alar   Chief Complaint  Patient presents with  . Dysphagia    HPI:   Kathy Hardy presents today at the request of Dr. Buelah Manis secondary to dysphagia. Started end of last month. Food stops right at suprasternal notch. Has to take large glass of water to take medications. Goes down extremely slow. Last night was trying to eat snap beans and pork chop but would not go down. Has to eat something soft. Soup. Notes odynophagia. Takes Protonix daily. Occasional reflux despite PPI. No lack of appetite. Intermittent vomiting of food because of feeling "stuck" in cervical area. Feels like she has to cock her neck back to swallow pills.   BPE performed as below. Last EGD in 2012 with mild gastritis. Next colonoscopy due in 2017.    Past Medical History  Diagnosis Date  . Hypertension   . Diabetes mellitus   . Hyperlipidemia   . Anxiety   . 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 Arthritis   . DDD (degenerative disc disease)     lumbar  . Insomnia   . Uterine cancer 2000    unknown what type  . 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 Stroke     TIAx 4,   . Peripheral vascular disease   . Shortness of breath     with exertion  . Asthma   . GERD (gastroesophageal reflux disease)   . Neuropathy     Past Surgical History  Procedure Laterality Date  . Right leg surgery x 4 (polio)    . Cholecystectomy      [place]  . Abdominal hysterectomy      [place]  . Eye surgery      bilateral  . Colonoscopy  07/09/2011    TFT:DDUKGURK hemorrhoids/Poor prep in the right colon, repeat in 2017  . Esophagogastroduodenoscopy  07/09/2011    Y[facility]:WCBJ gastritis  . Retinal detachment surgery  07/01/2012  . Pars plana vitrectomy  07/01/2012    Procedure: PARS PLANA VITRECTOMY WITH 25 GAUGE;  Surgeon: Hayden Pedro, MD;  Location: [facility];  Service: Ophthalmology;  Laterality: Right;  with  Laser treatment, Membrane peel, Gas Injection and Repair of Traction Retinal Detachment RIGHT eye    Current Outpatient Prescriptions  Medication Sig Dispense Refill  . albuterol (PROVENTIL HFA;VENTOLIN HFA) 108 (90 BASE) MCG/ACT inhaler Inhale 2 puffs into the lungs every 6 (six) hours as needed for wheezing or shortness of breath.      . ALPRAZolam (XANAX) 0.5 MG tablet Take 1 tablet (0.5 mg total) by mouth at bedtime as needed for sleep.  30 tablet  0  . amLODipine (NORVASC) 5 MG tablet TAKE 1 TABLET BY MOUTH DAILY.  30 tablet  6  . clopidogrel (PLAVIX) 75 MG tablet Take 75 mg by mouth daily.      . CRESTOR 20 MG tablet TAKE 1 TABLET BY MOUTH EVERY DAY  30 tablet  6  . dicyclomine (BENTYL) 10 MG capsule TAKE 1 CAPSULE BY MOUTH EVERY 4 TO 6 HOURS AS NEEDED FOR DIARRHEA OR ABDOMINAL CRAMPS.  120 capsule  3  . FLUoxetine (PROZAC) 40 MG capsule Take 1 capsule (40 mg total) by mouth daily.  30 capsule  3  . fluticasone (FLONASE) 50 MCG/ACT nasal spray USE 2 SPRAYS IN THE NOSE DAILY  16 g  2  . furosemide (  LASIX) 20 MG tablet TAKE 1 TABLET BY MOUTH DAILY  30 tablet  6  . gabapentin (NEURONTIN) 300 MG capsule TAKE 1 CAPSULE BY MOUTH AT BEDTIME  30 capsule  6  . insulin glargine (LANTUS) 100 UNIT/ML injection Inject 30 Units into the skin at bedtime.       Marland Kitchen levocetirizine (XYZAL) 5 MG tablet Take 1 tablet (5 mg total) by mouth every evening.  30 tablet  3  . lisinopril (PRINIVIL,ZESTRIL) 40 MG tablet Take 40 mg by mouth daily.      . meclizine (ANTIVERT) 25 MG tablet TAKE 1 TABLET BY MOUTH THREE TIMES DAILY AS NEEDED  30 tablet  0  . metoprolol succinate (TOPROL-XL) 100 MG 24 hr tablet Take 100 mg by mouth daily. Take with or immediately following a meal.      . ondansetron (ZOFRAN ODT) 4 MG disintegrating tablet 4mg  ODT q4 hours prn nausea/vomit  4 tablet  0  . pantoprazole (PROTONIX) 40 MG tablet TAKE 1 TABLET BY MOUTH EVERY DAY (STOP OMEPRAZOLE)  30 tablet  6  . cyclobenzaprine (FLEXERIL) 10 MG  tablet Take 1 tablet (10 mg total) by mouth 2 (two) times daily as needed for muscle spasms.  10 tablet  0  . hyoscyamine (LEVSIN SL) 0.125 MG SL tablet Place 1 tablet (0.125 mg total) under the tongue every 4 (four) hours as needed.  120 tablet  3  . insulin aspart (NOVOLOG) 100 UNIT/ML FlexPen Inject 5 Units into the skin 3 (three) times daily with meals. Give 5 units with each meal       Current Facility-Administered Medications  Medication Dose Route Frequency Provider Last Rate Last Dose  . Influenza (>/= 3 years) inactive virus vaccine (FLVIRIN/FLUZONE) injection SUSP 0.5 mL  0.5 mL Intramuscular Once Fayrene Helper, MD        Allergies as of 12/01/2013 - Review Complete 12/01/2013  Allergen Reaction Noted  . Oxycodone Itching and Other (See Comments) 03/15/2011  . Percocet [oxycodone-acetaminophen] Other (See Comments) 03/15/2011    Family History  Problem Relation Age of Onset  . Diabetes Mother   . Hypertension Mother   . Diabetes Sister   . Cancer Maternal Grandmother     type?  Marland Kitchen Anesthesia problems Neg Hx   . Hypotension Neg Hx   . Malignant hyperthermia Neg Hx   . Pseudochol deficiency Neg Hx   . Colon cancer Neg Hx     History   Social History  . Marital Status: Legally Separated    Spouse Name: N/A    Number of Children: 2  . Years of Education: N/A   Occupational History  . disabled    Social History Main Topics  . Smoking status: Never Smoker   . Smokeless tobacco: Never Used  . Alcohol Use: No  . 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 Sexual Activity: Not Currently   Other Topics Concern  . None   Social History Narrative  . None    Review of Systems: As mentioned in HPI.   Physical Exam: BP 132/74  Pulse 78  Temp(Src) 98.2 F (36.8 C) (Oral)  Ht 5\' 2"  (1.575 m)  Wt 169 lb (76.658 kg)  BMI 30.90 kg/m2 General:   Alert and oriented. No distress noted. Pleasant and cooperative.  Head:   Normocephalic and atraumatic. Eyes:  Conjuctiva clear without scleral icterus. Mouth:  Oral mucosa pink and moist. Good dentition.  No lesions. Heart:  S1, S2 present without murmurs, rubs, or gallops. Regular rate and rhythm. Abdomen:  +BS, soft, non-tender and non-distended. No rebound or guarding. No HSM or masses noted. Msk:  Symmetrical without gross deformities. Normal posture. Extremities:  Without edema. Neurologic:  Alert and  oriented x4;  grossly normal neurologically. Skin:  Intact without significant lesions or rashes. Psych:  Alert and cooperative. Normal mood and affect.  IMPRESSION:  1. Esophageal dysmotility with decreased primary stripping wave and  poor initiation of swallowing mechanism.  2. No evidence of esophageal stricture, mass or foreign body.  3. Mild gastroesophageal reflux.

## 2013-12-04 ENCOUNTER — Encounter: Payer: Self-pay | Admitting: Gastroenterology

## 2013-12-04 ENCOUNTER — Encounter (HOSPITAL_COMMUNITY): Payer: Self-pay | Admitting: Pharmacy Technician

## 2013-12-04 NOTE — Assessment & Plan Note (Signed)
58 year old female with acute onset dysphagia and odynophagia, with BPE revealing dysmotility and poor initiation of swallowing mechanism. No outright evidence of stricture, mass, or foreign body. Specifically points to suprasternal notch as culprit; noting intermittent vomiting due to dysphagia. Question motility disorder as culprit, may need neuro eval in future if difficulty with swallow initiation. However, will proceed with EGD first to rule out occult issues.   Proceed with upper endoscopy and possible dilation in the near future with Dr. Oneida Alar. The risks, benefits, and alternatives have been discussed in detail with patient. They have stated understanding and desire to proceed.  PROPOFOL due to hx of drug use. NEEDS CLEAN DRUG SCREEN Proceed with speech evaluation as well; referral made Continue Protonix daily

## 2013-12-07 NOTE — Progress Notes (Signed)
cc'd to pcp 

## 2013-12-08 NOTE — Telephone Encounter (Signed)
PA approved.   11/23/2013- 11/24/2014.  [id number].

## 2013-12-15 ENCOUNTER — Ambulatory Visit: Payer: Medicaid Other | Admitting: Family Medicine

## 2013-12-15 NOTE — Patient Instructions (Addendum)
Kathy Hardy  12/15/2013   Your procedure is scheduled on:  12/22/2013  Report to [facility] at  7:45  AM.  Call this number if you have problems the morning of surgery: [phone]   Remember:   Do not eat food or drink liquids after midnight.   Take these medicines the morning of surgery with A SIP OF WATER: Metoprolol, Protonix, Amlodipine, Fluoxetine, Lisinopril. Use your Albuterol inhaler morning of surgery. Take only half of your dose of Lantus insulin the night before your surgery = 15 units.   Do not wear jewelry, make-up or nail polish.  Do not wear lotions, powders, or perfumes.   Do not shave 48 hours prior to surgery. Men may shave face and neck.  Do not bring valuables to the hospital.  [facility] is not responsible for any belongings or valuables.               Contacts, dentures or bridgework may not be worn into surgery.  Leave suitcase in the car. After surgery it may be brought to your room.  For patients admitted to the hospital, discharge time is determined by your treatment team.               Patients discharged the day of surgery will not be allowed to drive home.    Special Instructions: Shower using CHG 1 night before surgery and the morning of surgery. Use special wash - you have one bottle of CHG for both showers.  You should use approximately 1/2 of the bottle for each shower.   Please read over the following fact sheets that you were given: Pain Booklet, Coughing and Deep Breathing, Surgical Site Infection Prevention, Anesthesia Post-op Instructions and Care and Recovery After Surgery   Esophageal Dilatation The esophagus is the long, narrow tube which carries food and liquid from the mouth to the stomach. Esophageal dilatation is the technique used to stretch a blocked or narrowed portion of the esophagus. This procedure is used when a part of the esophagus has become so narrow that it becomes difficult, painful or even impossible to swallow.  This is generally an uncomplicated form of treatment. When this is not successful, chest surgery may be required. This is a much more extensive form of treatment with a longer recovery time. CAUSES  Some of the more common causes of blockage or strictures of the esophagus are:  Narrowing from longstanding inflammation (soreness and redness) of the lower esophagus. This comes from the constant exposure of the lower esophagus to the acid which bubbles up from the stomach. Over time this causes scarring and narrowing of the lower esophagus.  Hiatal hernia in which a small part of the stomach bulges (herniates) up through the diaphragm. This can cause a gradual narrowing of the end of the esophagus.  Schatzki's Ring is a narrow ring of benign (non-cancerous) fibrous tissue which constricts the lower esophagus. The reason for this is not known.  Scleroderma is a connective tissue disorder that affects the esophagus and makes swallowing difficult.  Achalasia is an absence of nerves to the lower esophagus and to the esophageal sphincter. This is the circular muscle between the stomach and esophagus that relaxes to allow food into the stomach. After swallowing, it contracts to keep food in the stomach. This absence of nerves may be congenital (present since birth). This can cause irregular spasms of the lower esophageal muscle. This spasm does not open up to  allow food and fluid through. The result is a persistent blockage with subsequent slow trickling of the esophageal contents into the stomach.  Strictures may develop from swallowing materials which damage the esophagus. Some examples are strong acids or alkalis such as lye.  Growths such as benign (non-cancerous) and malignant (cancerous) tumors can block the esophagus.  Heredity (present since birth) causes. DIAGNOSIS  Your caregiver often suspects this problem by taking a medical history. They will also do a physical exam. They can then prove their  suspicions using X-rays and endoscopy. Endoscopy is an exam in which a tube like a small flexible telescope is used to look at your esophagus.  TREATMENT There are different stretching (dilating) techniques which can be used. Simple bougie dilatation may be done in the office. This usually takes only a couple minutes. A numbing (anesthetic) spray of the throat is used. Endoscopy, when done, is done in an endoscopy suite, under mild sedation. When fluoroscopy is used, the procedure is performed in X-ray. Other techniques require a little longer time. Recovery is usually quick. There is no waiting time to begin eating and drinking to test success of the treatment. Following are some of the methods used. Narrowing of the esophagus is treated by making it bigger. Commonly this is a mechanical problem which can be treated with stretching. This can be done in different ways. Your caregiver will discuss these with you. Some of the means used are:  A series of graduated (increasing thickness) flexible dilators can be used. These are weighted tubes passed through the esophagus into the stomach. The tubes used become progressively larger until the desired stretched size is reached. Graduated dilators are a simple and quick way of opening the esophagus. No visualization is required.  Another method is the use of endoscopy to place a flexible wire across the stricture. The endoscope is removed and the wire left in place. A dilator with a hole through it from end to end is guided down the esophagus and across the stricture. One or more of these dilators are passed over the wire. At the end of the exam, the wire is removed. This type of treatment may be performed in the X-ray department under fluoroscopy. An advantage of this procedure is the examiner is visualizing the end opening in the esophagus.  Stretching of the esophagus may be done using balloons. Deflated balloons are placed through the endoscope and across the  stricture. This type of balloon dilatation is often done at the time of endoscopy or fluoroscopy. Flexible endoscopy allows the examiner to directly view the stricture. A balloon is inserted in the deflated form into the area of narrowing. It is then inflated with air to a certain pressure that is pre-set for a given circumference. When inflated, it becomes sausage shaped, stretched, and makes the stricture larger.  Achalasia requires a longer larger balloon-type dilator. This is frequently done under X-ray control. In this situation, the spastic muscle fibers in the lower esophagus are stretched. All of the above procedures make the passage of food and water into the stomach easier. They also make it easier for stomach contents to reflux back into the esophagus. Special medications may be used following the procedure to help prevent further stricturing. Proton-pump inhibitor medications are good at decreasing the amount of acid in the stomach juice. When stomach juice refluxes into the esophagus, the juice is no longer as acidic and is less likely to burn or scar the esophagus. RISKS AND COMPLICATIONS Esophageal  dilatation is usually performed effectively and without problems. Some complications that can occur are:  A small amount of bleeding almost always happens where the stretching takes place. If this is too excessive it may require more aggressive treatment.  An uncommon complication is perforation (making a hole) of the esophagus. The esophagus is thin. It is easy to make a hole in it. If this happens, an operation may be necessary to repair this.  A small, undetected perforation could lead to an infection in the chest. This can be very serious. HOME CARE INSTRUCTIONS   If you received sedation for your procedure, do not drive, make important decisions, or perform any activities requiring your full coordination. Do not drink alcohol, take sedatives, or use any mind altering chemicals unless  instructed by your caregiver.  You may use throat lozenges or warm salt water gargles if you have throat discomfort  You can begin eating and drinking normally on return home unless instructed otherwise. Do not purposely try to force large chunks of food down to test the benefits of your procedure.  Mild discomfort can be eased with sips of ice water.  Medications for discomfort may or may not be needed. SEEK IMMEDIATE MEDICAL CARE IF:   You begin vomiting up blood.  You develop black tarry stools  You develop chills or an unexplained temperature of over 101 F (38.3 C)  You develop chest or abdominal pain.  You develop shortness of breath or feel lightheaded or faint.  Your swallowing is becoming more painful, difficult, or you are unable to swallow. MAKE SURE YOU:   Understand these instructions.  Will watch your condition.  Will get help right away if you are not doing well or get worse. Document Released: 09/06/2005 Document Revised: 10/08/2011 Document Reviewed: 10/24/2005 [facility] Patient Information 2014 [place]. Esophagogastroduodenoscopy Esophagogastroduodenoscopy (EGD) is a procedure to examine the lining of the esophagus, stomach, and first part of the small intestine (duodenum). A long, flexible, lighted tube with a camera attached (endoscope) is inserted down the throat to view these organs. This procedure is done to detect problems or abnormalities, such as inflammation, bleeding, ulcers, or growths, in order to treat them. The procedure lasts about 5 20 minutes. It is usually an outpatient procedure, but it may need to be performed in emergency cases in the hospital. LET YOUR CAREGIVER KNOW ABOUT:   Allergies to food or medicine.  All medicines you are taking, including vitamins, herbs, eyedrops, and over-the-counter medicines and creams.  Use of steroids (by mouth or creams).  Previous problems you or members of your family have had with the use of  anesthetics.  Any blood disorders you have.  Previous surgeries you have had.  Other health problems you have.  Possibility of pregnancy, if this applies. RISKS AND COMPLICATIONS  Generally, EGD is a safe procedure. However, as with any procedure, complications can occur. Possible complications include:  Infection.  Bleeding.  Tearing (perforation) of the esophagus, stomach, or duodenum.  Difficulty breathing or not being able to breath.  Excessive sweating.  Spasms of the larynx.  Slowed heartbeat.  Low blood pressure. BEFORE THE PROCEDURE  Do not eat or drink anything for 6 8 hours before the procedure or as directed by your caregiver.  Ask your caregiver about changing or stopping your regular medicines.  If you wear dentures, be prepared to remove them before the procedure.  Arrange for someone to drive you home after the procedure. PROCEDURE   A vein will  be accessed to give medicines and fluids. A medicine to relax you (sedative) and a pain reliever will be given through that access into the vein.  A numbing medicine (local anesthetic) may be sprayed on your throat for comfort and to stop you from gagging or coughing.  A mouth guard may be placed in your mouth to protect your teeth and to keep you from biting on the endoscope.  You will be asked to lie on your left side.  The endoscope is inserted down your throat and into the esophagus, stomach, and duodenum.  Air is put through the endoscope to allow your caregiver to view the lining of your esophagus clearly.  The esophagus, stomach, and duodenum is then examined. During the exam, your caregiver may:  Remove tissue to be examined under a microscope (biopsy) for inflammation, infection, or other medical problems.  Remove growths.  Remove objects (foreign bodies) that are stuck.  Treat any bleeding with medicines or other devices that stop tissues from bleeding (hot cauters, clipping devices).  Widen  (dilate) or stretch narrowed areas of the esophagus and stomach.  The endoscope will then be withdrawn. AFTER THE PROCEDURE  You will be taken to a recovery area to be monitored. You will be able to go home once you are stable and alert.  Do not eat or drink anything until the local anesthetic and numbing medicines have worn off. You may choke.  It is normal to feel bloated, have pain with swallowing, or have a sore throat for a short time. This will wear off.  Your caregiver should be able to discuss his or her findings with you. It will take longer to discuss the test results if any biopsies were taken. Document Released: 11/16/2004 Document Revised: 07/02/2012 Document Reviewed: 06/18/2012 [facility] Patient Information 2014 [place], [place]. PATIENT INSTRUCTIONS POST-ANESTHESIA  IMMEDIATELY FOLLOWING SURGERY:  Do not drive or operate machinery for the first twenty four hours after surgery.  Do not make any important decisions for twenty four hours after surgery or while taking narcotic pain medications or sedatives.  If you develop intractable nausea and vomiting or a severe headache please notify your doctor immediately.  FOLLOW-UP:  Please make an appointment with your surgeon as instructed. You do not need to follow up with anesthesia unless specifically instructed to do so.  WOUND CARE INSTRUCTIONS (if applicable):  Keep a dry clean dressing on the anesthesia/puncture wound site if there is drainage.  Once the wound has quit draining you may leave it open to air.  Generally you should leave the bandage intact for twenty four hours unless there is drainage.  If the epidural site drains for more than 36-48 hours please call the anesthesia department.  QUESTIONS?:  Please feel free to call your physician or the hospital operator if you have any questions, and they will be happy to assist you.

## 2013-12-16 ENCOUNTER — Encounter (HOSPITAL_COMMUNITY)
Admission: RE | Admit: 2013-12-16 | Discharge: 2013-12-16 | Disposition: A | Discharge status: Home / Self Care | Payer: Medicaid Other | Source: Ambulatory Visit | Attending: Gastroenterology | Admitting: Gastroenterology

## 2013-12-16 ENCOUNTER — Encounter (HOSPITAL_COMMUNITY): Payer: Self-pay

## 2013-12-16 DIAGNOSIS — Z01812 Encounter for preprocedural laboratory examination: Secondary | ICD-10-CM | POA: Insufficient documentation

## 2013-12-16 HISTORY — DX: Sleep apnea, unspecified: G47.30

## 2013-12-16 LAB — HEMOGLOBIN AND HEMATOCRIT, BLOOD
HCT: 38.5 % (ref 36.0–46.0)
Hemoglobin: 12.6 g/dL (ref 12.0–15.0)

## 2013-12-16 LAB — BASIC METABOLIC PANEL
BUN: 9 mg/dL (ref 6–23)
CHLORIDE: 102 meq/L (ref 96–112)
CO2: 28 meq/L (ref 19–32)
CREATININE: 0.63 mg/dL (ref 0.50–1.10)
Calcium: 9.3 mg/dL (ref 8.4–10.5)
GFR calc Af Amer: >90 mL/min (ref >90)
GFR calc non Af Amer: >90 mL/min (ref >90)
Glucose, Bld: 128 mg/dL — ABNORMAL HIGH (ref 70–99)
Potassium: 3.9 mEq/L (ref 3.7–5.3)
SODIUM: 144 meq/L (ref 137–147)

## 2013-12-16 NOTE — Progress Notes (Signed)
12/16/13 1125  OBSTRUCTIVE SLEEP APNEA  Have you ever been diagnosed with sleep apnea through a sleep study? No  Do you snore loudly (loud enough to be heard through closed doors)?  1  Do you often feel tired, fatigued, or sleepy during the daytime? 1  Has anyone observed you stop breathing during your sleep? 0  Do you have, or are you being treated for high blood pressure? 1  BMI more than 35 kg/m2? 0  Age over 56 years old? 1  Neck circumference greater than 40 cm/16 inches? 0  Gender: 0  Obstructive Sleep Apnea Score 4  Score 4 or greater  Results sent to PCP

## 2013-12-18 ENCOUNTER — Telehealth: Payer: Self-pay | Admitting: *Deleted

## 2013-12-18 MED ORDER — ALPRAZOLAM 0.5 MG PO TABS: 0.5000 mg | ORAL_TABLET | Freq: Every evening | ORAL | Status: DC | PRN

## 2013-12-18 NOTE — Telephone Encounter (Signed)
Ok to refill??  Last office visit 11/20/2013.  Last refill 11/18/2013

## 2013-12-18 NOTE — Telephone Encounter (Signed)
Medication called to pharmacy. 

## 2013-12-18 NOTE — Telephone Encounter (Signed)
Okay to refill give 1 extra refill

## 2013-12-22 ENCOUNTER — Ambulatory Visit (INDEPENDENT_AMBULATORY_CARE_PROVIDER_SITE_OTHER): Payer: Medicaid Other | Admitting: Ophthalmology

## 2013-12-22 ENCOUNTER — Ambulatory Visit (HOSPITAL_COMMUNITY): Payer: Medicaid Other | Admitting: Anesthesiology

## 2013-12-22 ENCOUNTER — Encounter (HOSPITAL_COMMUNITY): Payer: Medicaid Other | Admitting: Anesthesiology

## 2013-12-22 ENCOUNTER — Encounter (HOSPITAL_COMMUNITY)
Admission: RE | Disposition: A | Discharge status: Home / Self Care | Payer: Self-pay | Source: Ambulatory Visit | Attending: Gastroenterology

## 2013-12-22 ENCOUNTER — Ambulatory Visit (INDEPENDENT_AMBULATORY_CARE_PROVIDER_SITE_OTHER): Payer: Self-pay | Admitting: Ophthalmology

## 2013-12-22 ENCOUNTER — Ambulatory Visit (HOSPITAL_COMMUNITY)
Admission: RE | Admit: 2013-12-22 | Discharge: 2013-12-22 | Disposition: A | Discharge status: Home / Self Care | Payer: Medicaid Other | Source: Ambulatory Visit | Attending: Gastroenterology | Admitting: Gastroenterology

## 2013-12-22 DIAGNOSIS — R1310 Dysphagia, unspecified: Secondary | ICD-10-CM | POA: Insufficient documentation

## 2013-12-22 DIAGNOSIS — Z7901 Long term (current) use of anticoagulants: Secondary | ICD-10-CM | POA: Insufficient documentation

## 2013-12-22 DIAGNOSIS — Z5309 Procedure and treatment not carried out because of other contraindication: Secondary | ICD-10-CM | POA: Insufficient documentation

## 2013-12-22 LAB — RAPID URINE DRUG SCREEN, HOSP PERFORMED
AMPHETAMINES: NOT DETECTED
BENZODIAZEPINES: POSITIVE — AB
Barbiturates: NOT DETECTED
Cocaine: POSITIVE — AB
Opiates: NOT DETECTED
Tetrahydrocannabinol: NOT DETECTED

## 2013-12-22 LAB — GLUCOSE, CAPILLARY: Glucose-Capillary: 225 mg/dL — ABNORMAL HIGH (ref 70–99)

## 2013-12-22 SURGERY — CANCELLED PROCEDURE
Anesthesia: Monitor Anesthesia Care

## 2013-12-22 MED ORDER — MIDAZOLAM HCL 2 MG/2ML IJ SOLN
INTRAMUSCULAR | Status: AC
  Filled 2013-12-22: qty 2

## 2013-12-22 MED ORDER — PROPOFOL 10 MG/ML IV EMUL
INTRAVENOUS | Status: AC
  Filled 2013-12-22: qty 20

## 2013-12-22 MED ORDER — ONDANSETRON HCL 4 MG/2ML IJ SOLN: 4.0000 mg | Freq: Once | INTRAMUSCULAR | Status: DC

## 2013-12-22 MED ORDER — GLYCOPYRROLATE 0.2 MG/ML IJ SOLN: 0.2000 mg | Freq: Once | INTRAMUSCULAR | Status: DC

## 2013-12-22 MED ORDER — MIDAZOLAM HCL 2 MG/2ML IJ SOLN: 1.0000 mg | INTRAMUSCULAR | Status: DC | PRN

## 2013-12-22 MED ORDER — FENTANYL CITRATE 0.05 MG/ML IJ SOLN: 25.0000 ug | INTRAMUSCULAR | Status: AC

## 2013-12-22 MED ORDER — LACTATED RINGERS IV SOLN: INTRAVENOUS | Status: DC

## 2013-12-22 SURGICAL SUPPLY — 21 items
BLOCK BITE 60FR ADLT L/F BLUE (MISCELLANEOUS) ×3 IMPLANT
ELECT REM PT RETURN 9FT ADLT (ELECTROSURGICAL)
ELECTRODE REM PT RTRN 9FT ADLT (ELECTROSURGICAL) IMPLANT
FLOOR PAD 36X40 (MISCELLANEOUS) ×3
FORCEP RJ3 GP 1.8X160 W-NEEDLE (CUTTING FORCEPS) IMPLANT
FORCEPS BIOP RAD 4 LRG CAP 4 (CUTTING FORCEPS) IMPLANT
FORMALIN 10 PREFIL 20ML (MISCELLANEOUS) IMPLANT
KIT CLEAN ENDO COMPLIANCE (KITS) IMPLANT
MANIFOLD NEPTUNE II (INSTRUMENTS) ×3 IMPLANT
NEEDLE SCLEROTHERAPY 25GX240 (NEEDLE) IMPLANT
PAD FLOOR 36X40 (MISCELLANEOUS) ×1 IMPLANT
PROBE APC STR FIRE (PROBE) IMPLANT
PROBE INJECTION GOLD (MISCELLANEOUS)
PROBE INJECTION GOLD 7FR (MISCELLANEOUS) IMPLANT
SNARE ROTATE MED OVAL 20MM (MISCELLANEOUS) IMPLANT
SNARE SHORT THROW 13M SML OVAL (MISCELLANEOUS) IMPLANT
SYR 50ML LL SCALE MARK (SYRINGE) IMPLANT
SYR INFLATION 60ML (SYRINGE) IMPLANT
TUBING ENDO SMARTCAP PENTAX (MISCELLANEOUS) ×3 IMPLANT
TUBING IRRIGATION ENDOGATOR (MISCELLANEOUS) ×3 IMPLANT
WATER STERILE IRR 1000ML POUR (IV SOLUTION) IMPLANT

## 2013-12-22 NOTE — Progress Notes (Signed)
Straight cath done. 100 ml of cloudy yellow urine obtained. Specimen collected and sent to lab for drug screen results. Tolerated well.

## 2013-12-22 NOTE — Progress Notes (Signed)
Assisted with dressinig. D/C to home via w/c in good condition.

## 2013-12-22 NOTE — Progress Notes (Signed)
Dr Patsey Berthold notified of positive drug screen. Surgery cancelled.

## 2013-12-22 NOTE — Progress Notes (Signed)
Unable to void at this time. Dr Oneida Alar notified pt took plavix this AM. No new orders given.

## 2013-12-22 NOTE — Anesthesia Preprocedure Evaluation (Addendum)
Anesthesia Evaluation  Patient identified by MRN, date of birth, ID band Patient awake    Reviewed: Allergy & Precautions, H&P , NPO status , Patient's Chart, lab work & pertinent test results  History of Anesthesia Complications Negative for: history of anesthetic complications  Airway Mallampati: II      Dental  (+) Teeth Intact   Pulmonary neg pulmonary ROS, shortness of breath and with exertion, asthma , sleep apnea ,  breath sounds clear to auscultation        Cardiovascular hypertension, Pt. on medications + Peripheral Vascular Disease Rhythm:Regular Rate:Normal     Neuro/Psych PSYCHIATRIC DISORDERS Anxiety CVA, No Residual Symptoms    GI/Hepatic GERD-  Medicated,(+)     substance abuse  cocaine use,   Endo/Other  diabetes, Well Controlled, Type 2, Insulin Dependent, Oral Hypoglycemic Agents  Renal/GU      Musculoskeletal   Abdominal   Peds  Hematology   Anesthesia Other Findings   Reproductive/Obstetrics                          Anesthesia Physical Anesthesia Plan  ASA: III  Anesthesia Plan: MAC   Post-op Pain Management:    Induction:   Airway Management Planned: Simple Face Mask  Additional Equipment:   Intra-op Plan:   Post-operative Plan:   Informed Consent: I have reviewed the patients History and Physical, chart, labs and discussed the procedure including the risks, benefits and alternatives for the proposed anesthesia with the patient or authorized representative who has indicated his/her understanding and acceptance.     Plan Discussed with:   Anesthesia Plan Comments:         Anesthesia Quick Evaluation

## 2013-12-22 NOTE — Progress Notes (Signed)
Dr fields in to talk with pt. Informed pt surgery cancelled for positive drug screen. Informed pt office would be calling to reschedule. Voiced understanding.

## 2013-12-22 NOTE — Progress Notes (Signed)
Dr Oneida Alar notified of positive drug screen.

## 2013-12-22 NOTE — Progress Notes (Signed)
REVIEWED.  

## 2013-12-25 ENCOUNTER — Other Ambulatory Visit: Payer: Self-pay | Admitting: Family Medicine

## 2013-12-25 NOTE — Telephone Encounter (Signed)
Refill appropriate and filled per protocol. 

## 2013-12-28 ENCOUNTER — Other Ambulatory Visit: Payer: Self-pay | Admitting: Gastroenterology

## 2014-01-12 ENCOUNTER — Encounter: Payer: Self-pay | Admitting: Internal Medicine

## 2014-01-13 ENCOUNTER — Ambulatory Visit: Payer: Medicaid Other | Admitting: Internal Medicine

## 2014-02-19 ENCOUNTER — Other Ambulatory Visit: Payer: Self-pay | Admitting: *Deleted

## 2014-02-19 ENCOUNTER — Telehealth: Payer: Self-pay | Admitting: Family Medicine

## 2014-02-19 ENCOUNTER — Ambulatory Visit: Payer: Medicaid Other | Admitting: Family Medicine

## 2014-02-19 MED ORDER — INSULIN GLARGINE 100 UNIT/ML ~~LOC~~ SOLN: 30.0000 [IU] | Freq: Every day | SUBCUTANEOUS | Status: DC

## 2014-02-19 NOTE — Telephone Encounter (Signed)
Refill appropriate and filled per protocol. 

## 2014-02-19 NOTE — Telephone Encounter (Signed)
[facility] calling

## 2014-02-22 ENCOUNTER — Telehealth: Payer: Self-pay | Admitting: *Deleted

## 2014-02-22 ENCOUNTER — Other Ambulatory Visit: Payer: Self-pay | Admitting: Family Medicine

## 2014-02-22 ENCOUNTER — Other Ambulatory Visit: Payer: Self-pay | Admitting: *Deleted

## 2014-02-22 DIAGNOSIS — E119 Type 2 diabetes mellitus without complications: Secondary | ICD-10-CM

## 2014-02-22 MED ORDER — INSULIN GLARGINE 100 UNIT/ML SOLOSTAR PEN: 30.0000 [IU] | PEN_INJECTOR | Freq: Every day | SUBCUTANEOUS | Status: DC

## 2014-02-22 MED ORDER — ALPRAZOLAM 0.5 MG PO TABS: 0.5000 mg | ORAL_TABLET | Freq: Every evening | ORAL | Status: DC | PRN

## 2014-02-22 NOTE — Telephone Encounter (Signed)
Received fax requesting refill on Xanax.   Ok to refill??  Last office visit 11/20/2013.  Last refill 12/18/2013, #1 refill.

## 2014-02-22 NOTE — Telephone Encounter (Signed)
Refill appropriate and filled per protocol. 

## 2014-02-22 NOTE — Telephone Encounter (Signed)
Medication called to pharmacy.  Appointment scheduled.  

## 2014-02-22 NOTE — Telephone Encounter (Signed)
Give 1 refill, pt due for OV

## 2014-03-02 ENCOUNTER — Telehealth: Payer: Self-pay | Admitting: *Deleted

## 2014-03-02 NOTE — Telephone Encounter (Signed)
Received fax from pharmacy.   Requested PA for Crestor.   PA submitted.

## 2014-03-08 NOTE — Telephone Encounter (Signed)
Received PA determination.   PA approved 03/06/2014- 03/01/2015.

## 2014-03-29 ENCOUNTER — Ambulatory Visit: Payer: Self-pay | Admitting: Family Medicine

## 2014-04-21 ENCOUNTER — Other Ambulatory Visit: Payer: Self-pay | Admitting: Family Medicine

## 2014-04-21 NOTE — Telephone Encounter (Signed)
Medication filled x1 with no refills.  

## 2014-04-26 ENCOUNTER — Other Ambulatory Visit: Payer: Self-pay | Admitting: Family Medicine

## 2014-04-26 NOTE — Telephone Encounter (Signed)
Medication filled x1 with no refills.  

## 2014-05-08 ENCOUNTER — Encounter (HOSPITAL_COMMUNITY): Payer: Self-pay | Admitting: Emergency Medicine

## 2014-05-08 ENCOUNTER — Emergency Department (HOSPITAL_COMMUNITY): Payer: Medicaid Other

## 2014-05-08 ENCOUNTER — Emergency Department (HOSPITAL_COMMUNITY)
Admission: EM | Admit: 2014-05-08 | Discharge: 2014-05-08 | Disposition: A | Discharge status: Home / Self Care | Payer: Medicaid Other | Attending: Emergency Medicine | Admitting: Emergency Medicine

## 2014-05-08 DIAGNOSIS — Z8739 Personal history of other diseases of the musculoskeletal system and connective tissue: Secondary | ICD-10-CM | POA: Insufficient documentation

## 2014-05-08 DIAGNOSIS — M25561 Pain in right knee: Secondary | ICD-10-CM

## 2014-05-08 DIAGNOSIS — I10 Essential (primary) hypertension: Secondary | ICD-10-CM | POA: Diagnosis not present

## 2014-05-08 DIAGNOSIS — E11311 Type 2 diabetes mellitus with unspecified diabetic retinopathy with macular edema: Secondary | ICD-10-CM | POA: Diagnosis not present

## 2014-05-08 DIAGNOSIS — Z8612 Personal history of poliomyelitis: Secondary | ICD-10-CM | POA: Diagnosis not present

## 2014-05-08 DIAGNOSIS — Z792 Long term (current) use of antibiotics: Secondary | ICD-10-CM | POA: Insufficient documentation

## 2014-05-08 DIAGNOSIS — Z7951 Long term (current) use of inhaled steroids: Secondary | ICD-10-CM | POA: Insufficient documentation

## 2014-05-08 DIAGNOSIS — Z79899 Other long term (current) drug therapy: Secondary | ICD-10-CM | POA: Insufficient documentation

## 2014-05-08 DIAGNOSIS — F419 Anxiety disorder, unspecified: Secondary | ICD-10-CM | POA: Insufficient documentation

## 2014-05-08 DIAGNOSIS — Z8673 Personal history of transient ischemic attack (TIA), and cerebral infarction without residual deficits: Secondary | ICD-10-CM | POA: Diagnosis not present

## 2014-05-08 DIAGNOSIS — J45909 Unspecified asthma, uncomplicated: Secondary | ICD-10-CM | POA: Insufficient documentation

## 2014-05-08 DIAGNOSIS — Z794 Long term (current) use of insulin: Secondary | ICD-10-CM | POA: Insufficient documentation

## 2014-05-08 DIAGNOSIS — Z8544 Personal history of malignant neoplasm of other female genital organs: Secondary | ICD-10-CM | POA: Insufficient documentation

## 2014-05-08 DIAGNOSIS — K219 Gastro-esophageal reflux disease without esophagitis: Secondary | ICD-10-CM | POA: Insufficient documentation

## 2014-05-08 MED ORDER — HYDROCODONE-ACETAMINOPHEN 5-325 MG PO TABS: 1.0000 | ORAL_TABLET | ORAL | Status: DC | PRN

## 2014-05-08 MED ORDER — HYDROCODONE-ACETAMINOPHEN 5-325 MG PO TABS
1.0000 | ORAL_TABLET | Freq: Once | ORAL | Status: AC
  Administered 2014-05-08: 1 via ORAL
  Filled 2014-05-08: qty 1

## 2014-05-08 MED ORDER — IBUPROFEN 600 MG PO TABS: 600.0000 mg | ORAL_TABLET | Freq: Three times a day (TID) | ORAL | Status: DC | PRN

## 2014-05-08 NOTE — Discharge Instructions (Signed)

## 2014-05-08 NOTE — ED Notes (Signed)
Pt c/o pain and swelling in right knee. Pt states she felt as "pop" and fell onto right knee yesterday.

## 2014-05-09 NOTE — ED Provider Notes (Signed)
CSN: [id number]     Arrival date & time 05/08/14  1418 History   First MD Initiated Contact with Patient 05/08/14 1451     Chief Complaint  Patient presents with  . Leg Pain     (Consider location/radiation/quality/duration/timing/severity/associated sxs/prior Treatment) The history is provided by the patient.   Kathy Hardy is a 58 y.o. female presenting with acute right knee pain and swelling.  She describes tripping and falling forward, landing directly onto her right knee.  She has a history of occasional falls secondary to weakness associated with polio. Since she has had increased pain and swelling, and she felt a popping sensation during the injury.  She has a history of prior knee surgery for meniscal repairs and has weakness and gait issues related to her polio.  She has taken no medicines prior to arrival. Rest and elevation is somewhat helpful.    Past Medical History  Diagnosis Date  . Hypertension   . Diabetes mellitus   . Hyperlipidemia   . Anxiety   . 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 Arthritis   . DDD (degenerative disc disease)     lumbar  . Insomnia   . Uterine cancer 2000    unknown what type  . 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 Stroke     TIAx 4,   . Peripheral vascular disease   . Shortness of breath     with exertion  . Asthma   . GERD (gastroesophageal reflux disease)   . Neuropathy   . Sleep apnea     Stop Bang score of 4   Past Surgical History  Procedure Laterality Date  . Right leg surgery x 4 (polio)    . Cholecystectomy      [place]  . Abdominal hysterectomy      [facility]  . Eye surgery      bilateral  . Colonoscopy  07/09/2011    NTI:[id number] hemorrhoids/Poor prep in the right colon, repeat in 2017  . Esophagogastroduodenoscopy  07/09/2011    GQQ:PYPP gastritis  . Retinal detachment surgery Right 07/01/2012  . Pars plana vitrectomy  07/01/2012    Procedure: PARS PLANA VITRECTOMY WITH 25 GAUGE;  Surgeon: Hayden Pedro, MD;  Location: [facility];  Service: Ophthalmology;  Laterality: Right;  with Laser treatment, Membrane peel, Gas Injection and Repair of Traction Retinal Detachment RIGHT eye  . Carpal tunnel release Bilateral    Family History  Problem Relation Age of Onset  . Diabetes Mother   . Hypertension Mother   . Diabetes Sister   . Cancer Maternal Grandmother     type?  Marland Kitchen Anesthesia problems Neg Hx   . Hypotension Neg Hx   . Malignant hyperthermia Neg Hx   . Pseudochol deficiency Neg Hx   . Colon cancer Neg Hx    History  Substance Use Topics  . Smoking status: Never Smoker   . Smokeless tobacco: Never Used  . Alcohol Use: No   OB History   Grav Para Term Preterm Abortions TAB SAB Ect Mult Living                 Review of Systems  Constitutional: Negative for fever.  Musculoskeletal: Positive for arthralgias and joint swelling. Negative for myalgias.  Neurological: Negative for weakness and numbness.      Allergies  Oxycodone and Percocet  Home Medications   Prior to Admission medications   Medication Sig Start Date  End Date Taking? Authorizing Provider  albuterol (PROVENTIL HFA;VENTOLIN HFA) 108 (90 BASE) MCG/ACT inhaler Inhale 2 puffs into the lungs every 6 (six) hours as needed for wheezing or shortness of breath.   Yes Historical Provider, MD  ALPRAZolam Duanne Moron) 0.5 MG tablet Take 1 tablet (0.5 mg total) by mouth at bedtime as needed for sleep. 02/22/14  Yes Alycia Rossetti, MD  amLODipine (NORVASC) 5 MG tablet TAKE 1 TABLET BY MOUTH DAILY. 10/28/13  Yes Alycia Rossetti, MD  clopidogrel (PLAVIX) 75 MG tablet TAKE 1 TABLET BY MOUTH EVERY MORNING - PT WANTS SUN PHARM. BRAND 12/25/13  Yes Alycia Rossetti, MD  CRESTOR 20 MG tablet TAKE 1 TABLET BY MOUTH EVERY DAY 09/22/13  Yes Alycia Rossetti, MD  dicyclomine (BENTYL) 10 MG capsule TAKE 1 CAPSULE BY MOUTH EVERY 4 TO 6 HOURS AS NEEDED FOR DIARRHEA OR ABDOMINAL CRAMPS. 04/26/14  Yes Alycia Rossetti, MD  FLUoxetine (PROZAC)  40 MG capsule TAKE 1 CAPSULE BY MOUTH DAILY. (DOSE CHANGE) 02/22/14  Yes Alycia Rossetti, MD  fluticasone [facility]) 50 MCG/ACT nasal spray USE 2 SPRAYS IN EACH NOSTRIL DAILY 02/22/14  Yes Alycia Rossetti, MD  furosemide (LASIX) 20 MG tablet TAKE 1 TABLET BY MOUTH DAILY 10/28/13  Yes Alycia Rossetti, MD  gabapentin (NEURONTIN) 300 MG capsule TAKE 1 CAPSULE BY MOUTH AT BEDTIME 10/28/13  Yes Alycia Rossetti, MD  hyoscyamine (LEVSIN SL) 0.125 MG SL tablet Place 1 tablet (0.125 mg total) under the tongue every 4 (four) hours as needed. 12/01/13  Yes Orvil Feil, NP  insulin aspart (NOVOLOG) 100 UNIT/ML FlexPen Inject 5 Units into the skin 3 (three) times daily with meals. Give 5 units with each meal 09/16/13  Yes Alycia Rossetti, MD  Insulin Glargine (LANTUS SOLOSTAR) 100 UNIT/ML Solostar Pen Inject 30 Units into the skin daily at 10 pm. 02/22/14  Yes Alycia Rossetti, MD  levocetirizine (XYZAL) 5 MG tablet TAKE 1 TABLET IN THE EVENING. 04/21/14  Yes Alycia Rossetti, MD  lisinopril (PRINIVIL,ZESTRIL) 40 MG tablet Take 40 mg by mouth daily.   Yes Historical Provider, MD  meclizine (ANTIVERT) 25 MG tablet TAKE 1 TABLET BY MOUTH THREE TIMES DAILY AS NEEDED 04/27/13  Yes Alycia Rossetti, MD  metoprolol succinate (TOPROL-XL) 100 MG 24 hr tablet Take 100 mg by mouth daily. Take with or immediately following a meal.   Yes Historical Provider, MD  ondansetron (ZOFRAN ODT) 4 MG disintegrating tablet 4mg  ODT q4 hours prn nausea/vomit 04/07/13  Yes Evelina Bucy, MD  pantoprazole (PROTONIX) 40 MG tablet TAKE 1 TABLET BY MOUTH EVERY DAY (STOP OMEPRAZOLE) 02/22/14  Yes Alycia Rossetti, MD  HYDROcodone-acetaminophen (NORCO/VICODIN) 5-325 MG per tablet Take 1 tablet by mouth every 4 (four) hours as needed. 05/08/14   Evalee Jefferson, PA-C  ibuprofen (ADVIL,MOTRIN) 600 MG tablet Take 1 tablet (600 mg total) by mouth every 8 (eight) hours as needed for moderate pain. 05/08/14   Evalee Jefferson, PA-C   BP 173/101  Pulse 79  Temp(Src) 98.2  F (36.8 C) (Oral)  Resp 16  SpO2 100% Physical Exam  Constitutional: She appears well-developed and well-nourished.  HENT:  Head: Atraumatic.  Neck: Normal range of motion.  Cardiovascular:  Pulses equal bilaterally  Musculoskeletal: She exhibits edema and tenderness.       Right knee: She exhibits decreased range of motion and swelling. She exhibits no effusion, no ecchymosis, no deformity, no erythema, no LCL laxity and no MCL laxity.  Tenderness found. Medial joint line tenderness noted.  Neurological: She is alert. She has normal strength. She displays normal reflexes. No sensory deficit.  Skin: Skin is warm and dry.  Psychiatric: She has a normal mood and affect.    ED Course  Procedures (including critical care time) Labs Review Labs Reviewed - No data to display  Imaging Review Dg Knee Complete 4 Views Right  05/08/2014   CLINICAL DATA:  Right patellar and lateral knee pain status post fall yesterday.  EXAM: RIGHT KNEE - COMPLETE 4+ VIEW  COMPARISON:  Dec 21, 2011  FINDINGS: There is no evidence of fracture, dislocation. There is a small suprapatellar effusion. Chronic deformity of proximal fibula is noted. Soft tissues are unremarkable.  IMPRESSION: No acute fracture dislocation. Small suprapatellar effusion. Chronic deformity of proximal fibula.   Electronically Signed   By: Abelardo Diesel M.D.   On: 05/08/2014 15:40     EKG Interpretation None      MDM   Final diagnoses:  Knee pain, acute, right    Patients labs and/or radiological studies were viewed and considered during the medical decision making and disposition process. Discussed mobility issues with patient, she uses a cane at baseline and feels most comfortable maintaining use of this.  She was placed in an ace wrap, advised ice,  Elevation, prescribed hydrocodone, ibuprofen.  Planned f/u with her orthopedist this week if sx are not improving.  She was given hydrocodone here and pain improved.  Was more  comfortable at time of dc.  Discussed elevated bp.  Pt has not taken her morning bp meds, encouraged to take as soon as home.      Evalee Jefferson, PA-C 05/09/14 [place], PA-C 05/09/14 [place], PA-C 05/09/14 1023

## 2014-05-09 NOTE — ED Provider Notes (Signed)
Medical screening examination/treatment/procedure(s) were performed by non-physician practitioner and as supervising physician I was immediately available for consultation/collaboration.   EKG Interpretation None        Maudry Diego, MD 05/09/14 1506

## 2014-05-10 ENCOUNTER — Telehealth: Payer: Self-pay | Admitting: Orthopedic Surgery

## 2014-05-10 NOTE — Telephone Encounter (Signed)
Patient called, also had requested we speak with her aid -- regarding an appointment request, following [facility] room visit for problem of right knee pain and swelling.  States the Emergency room physician may have spoken with another orthopedic surgeon, as Dr Aline Brochure was not on call at the time.  Patient prefers to see Dr Aline Brochure.  Relayed that referral is required per her insurance -- states sees Dr. Legrand Rams, and will contact this office regarding referral.  Appointment on hold for referral.

## 2014-05-12 ENCOUNTER — Telehealth: Payer: Self-pay | Admitting: Orthopedic Surgery

## 2014-05-12 NOTE — Telephone Encounter (Signed)
Appointment has been scheduled for patient; she is aware.

## 2014-05-12 NOTE — Telephone Encounter (Signed)
Appointment has been scheduled. Patient aware

## 2014-05-17 ENCOUNTER — Encounter: Payer: Self-pay | Admitting: Orthopedic Surgery

## 2014-05-17 ENCOUNTER — Ambulatory Visit (INDEPENDENT_AMBULATORY_CARE_PROVIDER_SITE_OTHER): Payer: Medicaid Other | Admitting: Orthopedic Surgery

## 2014-05-17 VITALS — Resp 16 | Ht 62.0 in | Wt 165.0 lb

## 2014-05-17 DIAGNOSIS — M1710 Unilateral primary osteoarthritis, unspecified knee: Secondary | ICD-10-CM | POA: Diagnosis not present

## 2014-05-17 NOTE — Patient Instructions (Addendum)
Thank you for choosing [facility]. We usually run on time. Please try to be here 15 min prior to your appointment.  Osteoarthritis Osteoarthritis is a disease that causes soreness and inflammation of a joint. It occurs when the cartilage at the affected joint wears down. Cartilage acts as a cushion, covering the ends of bones where they meet to form a joint. Osteoarthritis is the most common form of arthritis. It often occurs in older people. The joints affected most often by this condition include those in the:  Ends of the fingers.  Thumbs.  Neck.  Lower back.  Knees.  Hips. CAUSES  Over time, the cartilage that covers the ends of bones begins to wear away. This causes bone to rub on bone, producing pain and stiffness in the affected joints.  RISK FACTORS Certain factors can increase your chances of having osteoarthritis, including:  Older age.  Excessive body weight.  Overuse of joints.  Previous joint injury. SIGNS AND SYMPTOMS   Pain, swelling, and stiffness in the joint.  Over time, the joint may lose its normal shape.  Small deposits of bone (osteophytes) may grow on the edges of the joint.  Bits of bone or cartilage can break off and float inside the joint space. This may cause more pain and damage. DIAGNOSIS  Your health care provider will do a physical exam and ask about your symptoms. Various tests may be ordered, such as:  X-rays of the affected joint.  An MRI scan.  Blood tests to rule out other types of arthritis.  Joint fluid tests. This involves using a needle to draw fluid from the joint and examining the fluid under a microscope. TREATMENT  Goals of treatment are to control pain and improve joint function. Treatment plans may include:  A prescribed exercise program that allows for rest and joint relief.  A weight control plan.  Pain relief techniques, such as:  Properly applied heat and cold.  Electric pulses  delivered to nerve endings under the skin (transcutaneous electrical nerve stimulation [TENS]).  Massage.  Certain nutritional supplements.  Medicines to control pain, such as:  Acetaminophen.  Nonsteroidal anti-inflammatory drugs (NSAIDs), such as naproxen.  Narcotic or central-acting agents, such as tramadol.  Corticosteroids. These can be given orally or as an injection.  Surgery to reposition the bones and relieve pain (osteotomy) or to remove loose pieces of bone and cartilage. Joint replacement may be needed in advanced states of osteoarthritis. HOME CARE INSTRUCTIONS   Take medicines only as directed by your health care provider.  Maintain a healthy weight. Follow your health care provider's instructions for weight control. This may include dietary instructions.  Exercise as directed. Your health care provider can recommend specific types of exercise. These may include:  Strengthening exercises. These are done to strengthen the muscles that support joints affected by arthritis. They can be performed with weights or with exercise bands to add resistance.  Aerobic activities. These are exercises, such as brisk walking or low-impact aerobics, that get your heart pumping.  Range-of-motion activities. These keep your joints limber.  Balance and agility exercises. These help you maintain daily living skills.  Rest your affected joints as directed by your health care provider.  Keep all follow-up visits as directed by your health care provider. SEEK MEDICAL CARE IF:   Your skin turns red.  You develop a rash in addition to your joint pain.  You have worsening joint pain.  You have a fever along  with joint or muscle aches. SEEK IMMEDIATE MEDICAL CARE IF:  You have a significant loss of weight or appetite.  You have night sweats. [place] of Arthritis and Musculoskeletal and Skin Diseases: www.niams.SouthExposed.es  [employer] on  Aging: http://kim-miller.com/  American College of Rheumatology: www.rheumatology.org Document Released: 07/16/2005 Document Revised: 11/30/2013 Document Reviewed: 03/23/2013 [facility] Patient Information 2015 [place], [place]. This information is not intended to replace advice given to you by your health care provider. Make sure you discuss any questions you have with your health care provider.

## 2014-05-17 NOTE — Progress Notes (Signed)
Chief Complaint  Patient presents with  . Knee Pain    Right knee pain, no injury. Xray at [facility]. Refferred by Dr. Legrand Rams.    58 year old female from Dr. Josephine Cables office presents with a problem of "fluid in my legs" however, she is here for evaluation of her right knee status post fall on Friday the 16th when she got up turned her leg pop and she presented to the ER with pain and swelling and trouble weightbearing. She complains of throbbing pain in her right knee primarily on the lateral joint line which she rates at 10. X-rays are not very remarkable. 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 She is oriented to person place and time mood and affect are normal gait and station is labored. We observed that she has tenderness in the lateral joint line small joint effusion knee range of motion passively is 130 and her knee is stable meniscal signs are negative muscle tone is normal. She has normal skin and pulse and temperature distally with normal sensation in her balance is somewhat altered and she walks with a cane paragraph her x-rays are unremarkable except for joint effusion and mild degenerative changes.  X-ray report included FINDINGS: There is no evidence of fracture, dislocation. There is a small suprapatellar effusion. Chronic deformity of proximal fibula is noted. Soft tissues are unremarkable.   IMPRESSION: No acute fracture dislocation.  Small suprapatellar effusion. Chronic deformity of proximal fibula.     Electronically Signed   By: Abelardo Diesel M.D.   On: 05/08/2014 15:40  Encounter Diagnosis  Name Primary?  . Primary osteoarthritis of one knee Yes    Procedure note right knee injection verbal consent was obtained to inject right knee joint  Timeout was completed to confirm the site of injection  The medications used were 40 mg of Depo-Medrol and 1% lidocaine 3 cc  Anesthesia was provided by ethyl chloride and the skin was prepped with alcohol.  After cleaning the skin with alcohol a 20-gauge needle was used to inject the right knee joint. There were no complications. A sterile bandage was applied.

## 2014-06-07 ENCOUNTER — Ambulatory Visit: Payer: Medicaid Other | Admitting: Gastroenterology

## 2014-07-07 ENCOUNTER — Other Ambulatory Visit: Payer: Self-pay | Admitting: Family Medicine

## 2014-07-08 NOTE — Telephone Encounter (Signed)
Patient has been discharged from practice.  Medication refill denied 

## 2014-08-03 ENCOUNTER — Other Ambulatory Visit: Payer: Self-pay | Admitting: Family Medicine

## 2014-08-03 NOTE — Telephone Encounter (Signed)
Refill denied.   Patient has been discharge from practice.

## 2014-08-06 ENCOUNTER — Ambulatory Visit: Payer: Medicaid Other | Admitting: Gastroenterology

## 2014-08-12 ENCOUNTER — Encounter (HOSPITAL_COMMUNITY): Payer: Self-pay | Admitting: Gastroenterology

## 2014-09-07 ENCOUNTER — Encounter: Payer: Self-pay | Admitting: Gastroenterology

## 2014-09-07 ENCOUNTER — Other Ambulatory Visit: Payer: Self-pay | Admitting: Family Medicine

## 2014-09-07 ENCOUNTER — Ambulatory Visit: Payer: Medicaid Other | Admitting: Gastroenterology

## 2014-09-07 ENCOUNTER — Telehealth: Payer: Self-pay | Admitting: Gastroenterology

## 2014-09-07 NOTE — Telephone Encounter (Signed)
PATIENT WAS A NO SHOW 09/07/14 AND LETTER WAS SENT

## 2014-09-08 NOTE — Telephone Encounter (Signed)
Patient has been discharged from practice.  Medication refill denied 

## 2014-09-09 ENCOUNTER — Other Ambulatory Visit: Payer: Self-pay | Admitting: Family Medicine

## 2014-09-09 NOTE — Telephone Encounter (Signed)
Refill denied.   Patient discharged from practice.

## 2014-09-22 ENCOUNTER — Emergency Department (HOSPITAL_COMMUNITY)
Admission: EM | Admit: 2014-09-22 | Discharge: 2014-09-22 | Disposition: A | Discharge status: Home / Self Care | Payer: MEDICAID | Attending: Emergency Medicine | Admitting: Emergency Medicine

## 2014-09-22 ENCOUNTER — Emergency Department (HOSPITAL_COMMUNITY): Payer: MEDICAID

## 2014-09-22 ENCOUNTER — Encounter (HOSPITAL_COMMUNITY): Payer: Self-pay | Admitting: Emergency Medicine

## 2014-09-22 DIAGNOSIS — M791 Myalgia: Secondary | ICD-10-CM | POA: Diagnosis not present

## 2014-09-22 DIAGNOSIS — J45901 Unspecified asthma with (acute) exacerbation: Secondary | ICD-10-CM | POA: Diagnosis not present

## 2014-09-22 DIAGNOSIS — Z8673 Personal history of transient ischemic attack (TIA), and cerebral infarction without residual deficits: Secondary | ICD-10-CM | POA: Insufficient documentation

## 2014-09-22 DIAGNOSIS — Z8619 Personal history of other infectious and parasitic diseases: Secondary | ICD-10-CM | POA: Insufficient documentation

## 2014-09-22 DIAGNOSIS — K219 Gastro-esophageal reflux disease without esophagitis: Secondary | ICD-10-CM | POA: Insufficient documentation

## 2014-09-22 DIAGNOSIS — Z7951 Long term (current) use of inhaled steroids: Secondary | ICD-10-CM | POA: Diagnosis not present

## 2014-09-22 DIAGNOSIS — M1990 Unspecified osteoarthritis, unspecified site: Secondary | ICD-10-CM | POA: Diagnosis not present

## 2014-09-22 DIAGNOSIS — F419 Anxiety disorder, unspecified: Secondary | ICD-10-CM | POA: Insufficient documentation

## 2014-09-22 DIAGNOSIS — R51 Headache: Secondary | ICD-10-CM | POA: Diagnosis present

## 2014-09-22 DIAGNOSIS — I10 Essential (primary) hypertension: Secondary | ICD-10-CM | POA: Insufficient documentation

## 2014-09-22 DIAGNOSIS — Z91040 Latex allergy status: Secondary | ICD-10-CM | POA: Insufficient documentation

## 2014-09-22 DIAGNOSIS — Z79899 Other long term (current) drug therapy: Secondary | ICD-10-CM | POA: Insufficient documentation

## 2014-09-22 DIAGNOSIS — E11311 Type 2 diabetes mellitus with unspecified diabetic retinopathy with macular edema: Secondary | ICD-10-CM | POA: Diagnosis not present

## 2014-09-22 DIAGNOSIS — Z7902 Long term (current) use of antithrombotics/antiplatelets: Secondary | ICD-10-CM | POA: Insufficient documentation

## 2014-09-22 DIAGNOSIS — G629 Polyneuropathy, unspecified: Secondary | ICD-10-CM | POA: Insufficient documentation

## 2014-09-22 DIAGNOSIS — F418 Other specified anxiety disorders: Secondary | ICD-10-CM

## 2014-09-22 DIAGNOSIS — Z794 Long term (current) use of insulin: Secondary | ICD-10-CM | POA: Insufficient documentation

## 2014-09-22 DIAGNOSIS — R112 Nausea with vomiting, unspecified: Secondary | ICD-10-CM | POA: Insufficient documentation

## 2014-09-22 DIAGNOSIS — Z8541 Personal history of malignant neoplasm of cervix uteri: Secondary | ICD-10-CM | POA: Diagnosis not present

## 2014-09-22 LAB — URINALYSIS, ROUTINE W REFLEX MICROSCOPIC
Bilirubin Urine: NEGATIVE
Glucose, UA: >1000 mg/dL — AB
Ketones, ur: NEGATIVE mg/dL
Leukocytes, UA: NEGATIVE
Nitrite: NEGATIVE
PROTEIN: 100 mg/dL — AB
Specific Gravity, Urine: 1.015 (ref 1.005–1.030)
UROBILINOGEN UA: 0.2 mg/dL (ref 0.0–1.0)
pH: 6.5 (ref 5.0–8.0)

## 2014-09-22 LAB — COMPREHENSIVE METABOLIC PANEL
ALK PHOS: 80 U/L (ref 39–117)
ALT: 13 U/L (ref 0–35)
AST: 14 U/L (ref 0–37)
Albumin: 4 g/dL (ref 3.5–5.2)
Anion gap: 10 (ref 5–15)
BILIRUBIN TOTAL: 0.4 mg/dL (ref 0.3–1.2)
BUN: 13 mg/dL (ref 6–23)
CHLORIDE: 100 mmol/L (ref 96–112)
CO2: 29 mmol/L (ref 19–32)
Calcium: 9.4 mg/dL (ref 8.4–10.5)
Creatinine, Ser: 0.58 mg/dL (ref 0.50–1.10)
GFR calc Af Amer: >90 mL/min (ref >90)
GFR calc non Af Amer: >90 mL/min (ref >90)
Glucose, Bld: 324 mg/dL — ABNORMAL HIGH (ref 70–99)
POTASSIUM: 3.9 mmol/L (ref 3.5–5.1)
Sodium: 139 mmol/L (ref 135–145)
Total Protein: 7.2 g/dL (ref 6.0–8.3)

## 2014-09-22 LAB — CBC WITH DIFFERENTIAL/PLATELET
BASOS PCT: 0 % (ref 0–1)
Basophils Absolute: 0 10*3/uL (ref 0.0–0.1)
Eosinophils Absolute: 0.1 10*3/uL (ref 0.0–0.7)
Eosinophils Relative: 1 % (ref 0–5)
HEMATOCRIT: 39.2 % (ref 36.0–46.0)
Hemoglobin: 13 g/dL (ref 12.0–15.0)
Lymphocytes Relative: 17 % (ref 12–46)
Lymphs Abs: 1.1 10*3/uL (ref 0.7–4.0)
MCH: 30 pg (ref 26.0–34.0)
MCHC: 33.2 g/dL (ref 30.0–36.0)
MCV: 90.5 fL (ref 78.0–100.0)
MONOS PCT: 5 % (ref 3–12)
Monocytes Absolute: 0.3 10*3/uL (ref 0.1–1.0)
NEUTROS ABS: 4.7 10*3/uL (ref 1.7–7.7)
Neutrophils Relative %: 77 % (ref 43–77)
Platelets: 159 10*3/uL (ref 150–400)
RBC: 4.33 MIL/uL (ref 3.87–5.11)
RDW: 12.9 % (ref 11.5–15.5)
WBC: 6.2 10*3/uL (ref 4.0–10.5)

## 2014-09-22 LAB — URINE MICROSCOPIC-ADD ON

## 2014-09-22 LAB — TROPONIN I

## 2014-09-22 LAB — CBG MONITORING, ED: Glucose-Capillary: 259 mg/dL — ABNORMAL HIGH (ref 70–99)

## 2014-09-22 MED ORDER — LORAZEPAM 2 MG/ML IJ SOLN
1.0000 mg | Freq: Once | INTRAMUSCULAR | Status: AC
  Administered 2014-09-22: 1 mg via INTRAVENOUS
  Filled 2014-09-22: qty 1

## 2014-09-22 MED ORDER — SODIUM CHLORIDE 0.9 % IV SOLN: INTRAVENOUS | Status: DC

## 2014-09-22 MED ORDER — DIPHENHYDRAMINE HCL 50 MG/ML IJ SOLN
12.5000 mg | Freq: Once | INTRAMUSCULAR | Status: AC
  Administered 2014-09-22: 12.5 mg via INTRAVENOUS
  Filled 2014-09-22: qty 1

## 2014-09-22 MED ORDER — PROCHLORPERAZINE EDISYLATE 5 MG/ML IJ SOLN
10.0000 mg | Freq: Once | INTRAMUSCULAR | Status: AC
  Administered 2014-09-22: 10 mg via INTRAVENOUS
  Filled 2014-09-22: qty 2

## 2014-09-22 MED ORDER — LORAZEPAM 1 MG PO TABS: 1.0000 mg | ORAL_TABLET | Freq: Three times a day (TID) | ORAL | Status: DC | PRN

## 2014-09-22 MED ORDER — SODIUM CHLORIDE 0.9 % IV BOLUS (SEPSIS)
500.0000 mL | Freq: Once | INTRAVENOUS | Status: AC
  Administered 2014-09-22: 500 mL via INTRAVENOUS

## 2014-09-22 NOTE — ED Notes (Signed)
Pt reports onset of headache, hypertension, emesis and diarrhea. EMS reports elevated CBG 359.

## 2014-09-22 NOTE — ED Provider Notes (Signed)
CSN: [id number]     Arrival date & time 09/22/14  1205 History  This chart was scribed for Richarda Blade, MD by Rayfield Citizen, ED Scribe. This patient was seen in room APA01/APA01 and the patient's care was started at 1:20 PM.    Chief Complaint  Patient presents with  . Headache  . Emesis   The history is provided by the patient. No language interpreter was used.     HPI Comments: Kathy Hardy is a 59 y.o. female with past medical history of HTN, DM, HLD, polio, asthma who presents to the Emergency Department complaining of 2 days of headache, vomiting, and generalized myalgias. Patient reports subjective fever, productive cough. She explains she has not been able to keep food down today; her blood sugar has oscillated since symptom onset.   She also reports significant stress at home; her son recently experienced some legal troubles and this has been an emotional time for her.   Past Medical History  Diagnosis Date  . Hypertension   . Diabetes mellitus   . Hyperlipidemia   . Anxiety   . 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 Arthritis   . DDD (degenerative disc disease)     lumbar  . Insomnia   . Uterine cancer 2000    unknown what type  . 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 Stroke     TIAx 4,   . Peripheral vascular disease   . Shortness of breath     with exertion  . Asthma   . GERD (gastroesophageal reflux disease)   . Neuropathy   . Sleep apnea     Stop Bang score of 4   Past Surgical History  Procedure Laterality Date  . Right leg surgery x 4 (polio)    . Cholecystectomy      [facility]  . Abdominal hysterectomy      [facility]  . Eye surgery      bilateral  . Colonoscopy  07/09/2011    UYQ:[id number] hemorrhoids/Poor prep in the right colon, repeat in 2017  . Esophagogastroduodenoscopy  07/09/2011    DGL:OVFI gastritis  . Retinal detachment surgery Right 07/01/2012  . Pars plana vitrectomy  07/01/2012    Procedure: PARS PLANA VITRECTOMY WITH 25 GAUGE;   Surgeon: Hayden Pedro, MD;  Location: [facility];  Service: Ophthalmology;  Laterality: Right;  with Laser treatment, Membrane peel, Gas Injection and Repair of Traction Retinal Detachment RIGHT eye  . Carpal tunnel release Bilateral    Family History  Problem Relation Age of Onset  . Diabetes Mother   . Hypertension Mother   . Diabetes Sister   . Cancer Maternal Grandmother     type?  Marland Kitchen Anesthesia problems Neg Hx   . Hypotension Neg Hx   . Malignant hyperthermia Neg Hx   . Pseudochol deficiency Neg Hx   . Colon cancer Neg Hx    History  Substance Use Topics  . Smoking status: Never Smoker   . Smokeless tobacco: Never Used  . Alcohol Use: No   OB History    Gravida Para Term Preterm AB TAB SAB Ectopic Multiple Living   2 2 2             Review of Systems  Constitutional: Positive for fever.  Respiratory: Positive for cough.   Gastrointestinal: Positive for nausea, vomiting and abdominal pain.  Musculoskeletal: Positive for myalgias.  Neurological: Positive for headaches.  All other systems reviewed  and are negative.     Allergies  Latex; Oxycodone; and Percocet  Home Medications   Prior to Admission medications   Medication Sig Start Date End Date Taking? Authorizing Provider  insulin aspart (NOVOLOG) 100 UNIT/ML FlexPen Inject 5 Units into the skin 3 (three) times daily with meals. Give 5 units with each meal 09/16/13  Yes Alycia Rossetti, MD  Insulin Glargine (LANTUS SOLOSTAR) 100 UNIT/ML Solostar Pen Inject 30 Units into the skin daily at 10 pm. 02/22/14  Yes Alycia Rossetti, MD  levocetirizine (XYZAL) 5 MG tablet TAKE 1 TABLET IN THE EVENING. 04/21/14  Yes Alycia Rossetti, MD  lisinopril (PRINIVIL,ZESTRIL) 20 MG tablet Take 20 mg by mouth daily.   Yes Historical Provider, MD  metoprolol succinate (TOPROL-XL) 100 MG 24 hr tablet Take 100 mg by mouth daily. Take with or immediately following a meal.   Yes Historical Provider, MD  albuterol (PROVENTIL HFA;VENTOLIN HFA)  108 (90 BASE) MCG/ACT inhaler Inhale 2 puffs into the lungs every 6 (six) hours as needed for wheezing or shortness of breath.    Historical Provider, MD  amLODipine (NORVASC) 5 MG tablet TAKE 1 TABLET BY MOUTH DAILY. 10/28/13   Alycia Rossetti, MD  clopidogrel (PLAVIX) 75 MG tablet TAKE 1 TABLET BY MOUTH EVERY MORNING - PT WANTS SUN PHARM. BRAND 12/25/13   Alycia Rossetti, MD  CRESTOR 20 MG tablet TAKE 1 TABLET BY MOUTH EVERY DAY 09/22/13   Alycia Rossetti, MD  dicyclomine (BENTYL) 10 MG capsule TAKE 1 CAPSULE BY MOUTH EVERY 4 TO 6 HOURS AS NEEDED FOR DIARRHEA OR ABDOMINAL CRAMPS. 04/26/14   Alycia Rossetti, MD  FLUoxetine (PROZAC) 40 MG capsule TAKE 1 CAPSULE BY MOUTH DAILY. (DOSE CHANGE) 02/22/14   Alycia Rossetti, MD  fluticasone [facility]) 50 MCG/ACT nasal spray USE 2 SPRAYS IN EACH NOSTRIL DAILY 02/22/14   Alycia Rossetti, MD  furosemide (LASIX) 20 MG tablet TAKE 1 TABLET BY MOUTH DAILY 10/28/13   Alycia Rossetti, MD  gabapentin (NEURONTIN) 300 MG capsule TAKE 1 CAPSULE BY MOUTH AT BEDTIME 10/28/13   Alycia Rossetti, MD  HYDROcodone-acetaminophen (NORCO/VICODIN) 5-325 MG per tablet Take 1 tablet by mouth every 4 (four) hours as needed. 05/08/14   Evalee Jefferson, PA-C  hyoscyamine (LEVSIN SL) 0.125 MG SL tablet Place 1 tablet (0.125 mg total) under the tongue every 4 (four) hours as needed. 12/01/13   Orvil Feil, NP  ibuprofen (ADVIL,MOTRIN) 600 MG tablet Take 1 tablet (600 mg total) by mouth every 8 (eight) hours as needed for moderate pain. 05/08/14   Evalee Jefferson, PA-C  LORazepam (ATIVAN) 1 MG tablet Take 1 tablet (1 mg total) by mouth 3 (three) times daily as needed for anxiety. 09/22/14   Richarda Blade, MD  meclizine (ANTIVERT) 25 MG tablet TAKE 1 TABLET BY MOUTH THREE TIMES DAILY AS NEEDED 04/27/13   Alycia Rossetti, MD  ondansetron (ZOFRAN ODT) 4 MG disintegrating tablet 4mg  ODT q4 hours prn nausea/vomit 04/07/13   Evelina Bucy, MD  pantoprazole (PROTONIX) 40 MG tablet TAKE 1 TABLET BY MOUTH EVERY DAY  (STOP OMEPRAZOLE) 02/22/14   Alycia Rossetti, MD   BP 126/73 mmHg  Pulse 86  Temp(Src) 98.1 F (36.7 C) (Oral)  Resp 17  Ht 5\' 2"  (1.575 m)  Wt 165 lb (74.844 kg)  BMI 30.17 kg/m2  SpO2 96% Physical Exam  Constitutional: She is oriented to person, place, and time. She appears well-developed and well-nourished.  HENT:  Head: Normocephalic and atraumatic.  Eyes: Conjunctivae and EOM are normal. Pupils are equal, round, and reactive to light.  Neck: Normal range of motion and phonation normal. Neck supple.  Cardiovascular: Normal rate and regular rhythm.   Pulmonary/Chest: Effort normal and breath sounds normal. She exhibits no tenderness.  Abdominal: Soft. She exhibits no distension. There is no tenderness. There is no guarding.  Musculoskeletal: Normal range of motion.  Right leg is 4/5 strength; left leg is 5/5  Neurological: She is alert and oriented to person, place, and time. She exhibits normal muscle tone.  Skin: Skin is warm and dry.  Psychiatric: She has a normal mood and affect. Her behavior is normal. Judgment and thought content normal.  Nursing note and vitals reviewed.   ED Course  Procedures   DIAGNOSTIC STUDIES: Oxygen Saturation is 96% on RA, adequate by my interpretation.    COORDINATION OF CARE:  Medications  0.9 %  sodium chloride infusion (not administered)  sodium chloride 0.9 % bolus 500 mL (0 mLs Intravenous Stopped 09/22/14 1610)  LORazepam (ATIVAN) injection 1 mg (1 mg Intravenous Given 09/22/14 1338)  prochlorperazine (COMPAZINE) injection 10 mg (10 mg Intravenous Given 09/22/14 1344)  diphenhydrAMINE (BENADRYL) injection 12.5 mg (12.5 mg Intravenous Given 09/22/14 1340)    Patient Vitals for the past 24 hrs:  BP Temp Temp src Pulse Resp SpO2 Height Weight  09/22/14 1609 - 98.1 F (36.7 C) Oral - - - - -  09/22/14 1600 126/73 mmHg - - 86 17 96 % - -  09/22/14 1530 140/77 mmHg - - 89 18 97 % - -  09/22/14 1442 145/85 mmHg - - 96 18 96 % - -   09/22/14 1400 142/83 mmHg - - - 17 - - -  09/22/14 1335 159/85 mmHg - - 100 14 98 % - -  09/22/14 1334 159/85 mmHg - - 103 - (!) 77 % - -  09/22/14 1230 180/91 mmHg - - 98 19 99 % - -  09/22/14 1211 161/95 mmHg 98.2 F (36.8 C) Oral 101 18 96 % 5\' 2"  (1.575 m) 165 lb (74.844 kg)   1:26 PM Discussed treatment plan with pt at bedside and pt agreed to plan.  Labs Review Labs Reviewed  COMPREHENSIVE METABOLIC PANEL - Abnormal; Notable for the following:    Glucose, Bld 324 (*)    All other components within normal limits  URINALYSIS, ROUTINE W REFLEX MICROSCOPIC - Abnormal; Notable for the following:    Glucose, UA >1000 (*)    Hgb urine dipstick SMALL (*)    Protein, ur 100 (*)    All other components within normal limits  CBG MONITORING, ED - Abnormal; Notable for the following:    Glucose-Capillary 259 (*)    All other components within normal limits  CBC WITH DIFFERENTIAL/PLATELET  TROPONIN I  URINE MICROSCOPIC-ADD ON    Imaging Review Dg Chest Portable 1 View  09/22/2014   CLINICAL DATA:  Recent emesis and headaches  EXAM: PORTABLE CHEST - 1 VIEW  COMPARISON:  07/01/2012  FINDINGS: The heart size and mediastinal contours are within normal limits. Both lungs are clear. The visualized skeletal structures are unremarkable.  IMPRESSION: No active disease.   Electronically Signed   By: Inez Catalina M.D.   On: 09/22/2014 13:41     EKG Interpretation   Date/Time:  Wednesday September 22 2014 12:06:59 EST Ventricular Rate:  101 PR Interval:  145 QRS Duration: 144 QT Interval:  387 QTC Calculation: 502 R  Axis:   74 Text Interpretation:  Sinus tachycardia Right bundle branch block Artifact  in lead(s) I II III aVR aVL V2 and baseline wander in lead(s) V1 Since  last tracing rate faster Confirmed by [facility]  MD, Eliot Bencivenga [phone]) on  09/22/2014 1:06:57 PM       EKG Interpretation  Date/Time:  Wednesday September 22 2014 12:06:59 EST Ventricular Rate:  101 PR Interval:  145 QRS  Duration: 144 QT Interval:  387 QTC Calculation: 502 R Axis:   74 Text Interpretation:  Sinus tachycardia Right bundle branch block Artifact in lead(s) I II III aVR aVL V2 and baseline wander in lead(s) V1 Since last tracing rate faster Confirmed by Lebaron Bautch  MD, Rafi Kenneth ([id number]) on 09/22/2014 1:06:57 PM      MDM   Final diagnoses:  Situational anxiety    Nonspecific myalgias, with situational anxiety.  No evidence for significant gastrointestinal disorder, metabolic instability or serious bacterial infection.   Nursing Notes Reviewed/ Care Coordinated Applicable Imaging Reviewed Interpretation of Laboratory Data incorporated into ED treatment  The patient appears reasonably screened and/or stabilized for discharge and I doubt any other medical condition or other [facility] requiring further screening, evaluation, or treatment in the ED at this time prior to discharge.  Plan: Home Medications- Ativan; Home Treatments- reat; return here if the recommended treatment, does not improve the symptoms; Recommended follow up- PCP prn  I personally performed the services described in this documentation, which was scribed in my presence. The recorded information has been reviewed and is accurate.      Richarda Blade, MD 09/22/14 [phone]

## 2014-11-25 ENCOUNTER — Other Ambulatory Visit: Payer: Self-pay | Admitting: Family Medicine

## 2014-11-25 NOTE — Telephone Encounter (Signed)
Refill refused.   Patient is no longer under MD.

## 2015-01-10 ENCOUNTER — Other Ambulatory Visit: Payer: Self-pay | Admitting: Family Medicine

## 2015-01-10 NOTE — Telephone Encounter (Signed)
Refill denied.   Patient is no longer under MD.

## 2016-03-28 ENCOUNTER — Encounter: Payer: Self-pay | Admitting: Gastroenterology

## 2016-07-12 DIAGNOSIS — M419 Scoliosis, unspecified: Secondary | ICD-10-CM | POA: Insufficient documentation

## 2016-08-23 ENCOUNTER — Ambulatory Visit: Payer: Medicaid Other | Admitting: "Endocrinology

## 2016-10-11 ENCOUNTER — Encounter: Payer: Self-pay | Admitting: Gastroenterology

## 2016-11-02 ENCOUNTER — Encounter: Payer: Self-pay | Admitting: Gastroenterology

## 2016-11-02 ENCOUNTER — Telehealth: Payer: Self-pay | Admitting: Gastroenterology

## 2016-11-02 ENCOUNTER — Ambulatory Visit: Payer: Medicaid Other | Admitting: Gastroenterology

## 2016-11-02 NOTE — Telephone Encounter (Signed)
PT WAS A NO SHOW AND LETTER SENT  °

## 2017-01-25 ENCOUNTER — Ambulatory Visit (INDEPENDENT_AMBULATORY_CARE_PROVIDER_SITE_OTHER): Payer: Medicaid Other | Admitting: Family Medicine

## 2017-01-25 ENCOUNTER — Encounter: Payer: Self-pay | Admitting: Family Medicine

## 2017-01-25 VITALS — BP 148/82 | HR 88 | Temp 96.7°F | Resp 18 | Ht 62.0 in | Wt 147.0 lb

## 2017-01-25 DIAGNOSIS — I10 Essential (primary) hypertension: Secondary | ICD-10-CM

## 2017-01-25 DIAGNOSIS — F1910 Other psychoactive substance abuse, uncomplicated: Secondary | ICD-10-CM

## 2017-01-25 DIAGNOSIS — E118 Type 2 diabetes mellitus with unspecified complications: Secondary | ICD-10-CM

## 2017-01-25 NOTE — Progress Notes (Signed)
Patient presents today as a new patient to establish with this office. Her prior family medicine visit in 07/18/2016 is reviewed in the chart, she saw Skeet Simmer at [facility]. She tells me she is unhappy with her primary because she won't manage her pain. She tells me she has pain at a level of 10 over 10 today, body wide. She has scoliosis of the spine condition. She is requesting pain medication. She also tells me she has anxiety. She has been on both Ativan and Xanax. She requests a refill of her anxiety medication. As I review her chart I see that she does have a history of noncompliance, no shows, substance abuse, and failed drug screens. She has failed pain contracts in the past. She told me that the chart was "wrong" and that she does not have a history of substance abuse I discussed with her that I would provide for her primary care without prescribing controlled substances. After discussion we came to the decision that she will stay with her current primary care provider and I recommend she request a pain consult if she feels her condition warrants stronger pain medication.

## 2017-01-25 NOTE — Patient Instructions (Signed)
stay with your current medical provider

## 2017-04-13 ENCOUNTER — Emergency Department (HOSPITAL_COMMUNITY): Payer: Medicaid Other

## 2017-04-13 ENCOUNTER — Encounter (HOSPITAL_COMMUNITY): Payer: Self-pay | Admitting: Emergency Medicine

## 2017-04-13 ENCOUNTER — Emergency Department (HOSPITAL_COMMUNITY)
Admission: EM | Admit: 2017-04-13 | Discharge: 2017-04-13 | Disposition: A | Discharge status: Home / Self Care | Payer: Medicaid Other | Attending: Emergency Medicine | Admitting: Emergency Medicine

## 2017-04-13 DIAGNOSIS — I10 Essential (primary) hypertension: Secondary | ICD-10-CM | POA: Insufficient documentation

## 2017-04-13 DIAGNOSIS — Z8541 Personal history of malignant neoplasm of cervix uteri: Secondary | ICD-10-CM | POA: Diagnosis not present

## 2017-04-13 DIAGNOSIS — W19XXXA Unspecified fall, initial encounter: Secondary | ICD-10-CM | POA: Diagnosis not present

## 2017-04-13 DIAGNOSIS — Z91040 Latex allergy status: Secondary | ICD-10-CM | POA: Insufficient documentation

## 2017-04-13 DIAGNOSIS — Y999 Unspecified external cause status: Secondary | ICD-10-CM | POA: Diagnosis not present

## 2017-04-13 DIAGNOSIS — Z794 Long term (current) use of insulin: Secondary | ICD-10-CM | POA: Diagnosis not present

## 2017-04-13 DIAGNOSIS — Y929 Unspecified place or not applicable: Secondary | ICD-10-CM | POA: Insufficient documentation

## 2017-04-13 DIAGNOSIS — J45909 Unspecified asthma, uncomplicated: Secondary | ICD-10-CM | POA: Diagnosis not present

## 2017-04-13 DIAGNOSIS — Y9389 Activity, other specified: Secondary | ICD-10-CM | POA: Diagnosis not present

## 2017-04-13 DIAGNOSIS — Z79899 Other long term (current) drug therapy: Secondary | ICD-10-CM | POA: Insufficient documentation

## 2017-04-13 DIAGNOSIS — R509 Fever, unspecified: Secondary | ICD-10-CM | POA: Diagnosis not present

## 2017-04-13 DIAGNOSIS — E119 Type 2 diabetes mellitus without complications: Secondary | ICD-10-CM | POA: Insufficient documentation

## 2017-04-13 DIAGNOSIS — T07XXXA Unspecified multiple injuries, initial encounter: Secondary | ICD-10-CM

## 2017-04-13 DIAGNOSIS — Z7902 Long term (current) use of antithrombotics/antiplatelets: Secondary | ICD-10-CM | POA: Diagnosis not present

## 2017-04-13 DIAGNOSIS — S80922A Unspecified superficial injury of left lower leg, initial encounter: Secondary | ICD-10-CM | POA: Diagnosis present

## 2017-04-13 DIAGNOSIS — S80812A Abrasion, left lower leg, initial encounter: Secondary | ICD-10-CM | POA: Insufficient documentation

## 2017-04-13 DIAGNOSIS — R079 Chest pain, unspecified: Secondary | ICD-10-CM

## 2017-04-13 LAB — CBC WITH DIFFERENTIAL/PLATELET
Basophils Absolute: 0 10*3/uL (ref 0.0–0.1)
Basophils Relative: 0 %
EOS PCT: 1 %
Eosinophils Absolute: 0.1 10*3/uL (ref 0.0–0.7)
HCT: 33.9 % — ABNORMAL LOW (ref 36.0–46.0)
Hemoglobin: 11.2 g/dL — ABNORMAL LOW (ref 12.0–15.0)
Lymphocytes Relative: 9 %
Lymphs Abs: 0.8 10*3/uL (ref 0.7–4.0)
MCH: 30.1 pg (ref 26.0–34.0)
MCHC: 33 g/dL (ref 30.0–36.0)
MCV: 91.1 fL (ref 78.0–100.0)
Monocytes Absolute: 0.7 10*3/uL (ref 0.1–1.0)
Monocytes Relative: 8 %
Neutro Abs: 7.6 10*3/uL (ref 1.7–7.7)
Neutrophils Relative %: 82 %
PLATELETS: 156 10*3/uL (ref 150–400)
RBC: 3.72 MIL/uL — AB (ref 3.87–5.11)
RDW: 13.9 % (ref 11.5–15.5)
WBC: 9.2 10*3/uL (ref 4.0–10.5)

## 2017-04-13 LAB — BASIC METABOLIC PANEL
Anion gap: 8 (ref 5–15)
BUN: 12 mg/dL (ref 6–20)
CO2: 31 mmol/L (ref 22–32)
CREATININE: 0.73 mg/dL (ref 0.44–1.00)
Calcium: 9 mg/dL (ref 8.9–10.3)
Chloride: 100 mmol/L — ABNORMAL LOW (ref 101–111)
GFR calc non Af Amer: >60 mL/min (ref >60)
Glucose, Bld: 276 mg/dL — ABNORMAL HIGH (ref 65–99)
Potassium: 3.5 mmol/L (ref 3.5–5.1)
SODIUM: 139 mmol/L (ref 135–145)

## 2017-04-13 MED ORDER — ACETAMINOPHEN 325 MG PO TABS
650.0000 mg | ORAL_TABLET | Freq: Once | ORAL | Status: AC
  Administered 2017-04-13: 650 mg via ORAL
  Filled 2017-04-13: qty 2

## 2017-04-13 MED ORDER — DOXYCYCLINE HYCLATE 100 MG PO TABS
100.0000 mg | ORAL_TABLET | Freq: Once | ORAL | Status: AC
  Administered 2017-04-13: 100 mg via ORAL
  Filled 2017-04-13: qty 1

## 2017-04-13 MED ORDER — DOXYCYCLINE HYCLATE 100 MG PO CAPS: 100.0000 mg | ORAL_CAPSULE | Freq: Two times a day (BID) | ORAL | 0 refills | Status: DC

## 2017-04-13 MED ORDER — IBUPROFEN 800 MG PO TABS
800.0000 mg | ORAL_TABLET | Freq: Once | ORAL | Status: AC
  Administered 2017-04-13: 800 mg via ORAL
  Filled 2017-04-13: qty 1

## 2017-04-13 MED ORDER — IBUPROFEN 800 MG PO TABS: 800.0000 mg | ORAL_TABLET | Freq: Three times a day (TID) | ORAL | 0 refills | Status: DC

## 2017-04-13 NOTE — Discharge Instructions (Signed)
Doxycycline as prescribed until gone. Motrin or tylenol for fever.

## 2017-04-13 NOTE — ED Notes (Signed)
Checked with patient for urine sample,patient states she just went before she came,will try back in 30 minutes.

## 2017-04-13 NOTE — ED Provider Notes (Addendum)
[facility] DEPT Provider Note   CSN: [id number] Arrival date & time: 04/13/17  1816     History   Chief Complaint Chief Complaint  Patient presents with  . Fall    HPI Kathy Hardy is a 61 y.o. female. Chief complaint is leg pain, and chest pain after fall.   HPI:  Patient had a fall on her toilet about 3 nights ago. Seen and evaluated that night [place] her x-ray was normal. Presents today complaining of weakness fatigue right-sided chest pain where she fell. A scrape on her left leg she is concerned is infected, and right knee pain. Also some back pain.  Shakes today and felt cold has not checked temperature. Has a cough. Nonproductive. Hurts to cough.  Past Medical History:  Diagnosis Date  . Allergy   . Anxiety   . Arthritis   . Asthma   . DDD (degenerative disc disease)    lumbar  . Diabetes mellitus   . 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 GERD (gastroesophageal reflux disease)   . Glaucoma   . Hyperlipidemia   . Hypertension   . Insomnia   . Neuropathy   . Peripheral vascular disease ([place])   . 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 Shortness of breath    with exertion  . Sleep apnea    Stop Bang score of 4  . Stroke ([place])    TIAx 4,   . Substance abuse    cocaine  . Uterine cancer ([place]) 2000   unknown what type    Patient Active Problem List   Diagnosis Date Noted  . Scoliosis 07/12/2016  . Primary osteoarthritis of one knee 05/17/2014  . Dysphagia, unspecified(787.20) 12/01/2013  . Substance abuse 11/20/2013  . Non compliance w medication regimen 09/14/2013  . Proliferative diabetic retinopathy ([place]) 06/11/2012  . Diabetic neuropathy ([place]) 02/28/2012  . Chronic back pain 01/03/2012  . IBS (irritable bowel syndrome) 10/24/2011  . Allergic rhinitis 08/16/2011  . Chronic constipation 06/18/2011  . Gait disturbance 03/16/2011  . 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 Diabetes mellitus with complication ([place])  56/43/3295  . Hyperlipidemia 03/15/2011  . Essential hypertension, benign 03/15/2011  . GAD (generalized anxiety disorder) 03/15/2011  . Chronic pain 03/15/2011    Past Surgical History:  Procedure Laterality Date  . ABDOMINAL HYSTERECTOMY      uterine cancer  . CARPAL TUNNEL RELEASE Bilateral   . CHOLECYSTECTOMY     [place]  . COLONOSCOPY  07/09/2011   JOA:CZYSAYTK hemorrhoids/Poor prep in the right colon, repeat in 2017  . ESOPHAGOGASTRODUODENOSCOPY  07/09/2011   ZSW:FUXN gastritis  . EYE SURGERY     bilateral  . PARS PLANA VITRECTOMY  07/01/2012   Procedure: PARS PLANA VITRECTOMY WITH 25 GAUGE;  Surgeon: Hayden Pedro, MD;  Location: [facility];  Service: Ophthalmology;  Laterality: Right;  with Laser treatment, Membrane peel, Gas Injection and Repair of Traction Retinal Detachment RIGHT eye  . RETINAL DETACHMENT SURGERY Right 07/01/2012  . Right leg surgery x 4 (polio)      OB History    Gravida Para Term Preterm AB Living   2 2 2     2    SAB TAB Ectopic Multiple Live Births                   Home Medications    Prior to Admission medications   Medication Sig Start Date End Date Taking? Authorizing Provider  amLODipine (NORVASC) 5 MG tablet TAKE 1 TABLET BY MOUTH DAILY. 10/28/13  Yes [place], Modena Nunnery, MD  clopidogrel (PLAVIX) 75 MG tablet TAKE 1 TABLET BY MOUTH EVERY MORNING - PT WANTS SUN PHARM. BRAND 12/25/13  Yes [place], Modena Nunnery, MD  furosemide (LASIX) 20 MG tablet TAKE 1 TABLET BY MOUTH DAILY 10/28/13  Yes [place], Modena Nunnery, MD  gabapentin (NEURONTIN) 300 MG capsule TAKE 1 CAPSULE BY MOUTH AT BEDTIME Patient taking differently: TAKE ONE CAPSULE BY MOUTH THREE TIMES DAILY 10/28/13  Yes [place], Modena Nunnery, MD  hydrOXYzine (ATARAX/VISTARIL) 25 MG tablet Take 25 mg by mouth 3 (three) times daily as needed for anxiety or itching.   Yes [provider]  insulin aspart (NOVOLOG) 100 UNIT/ML FlexPen Inject 5 Units into the skin 3 (three) times daily with meals. Give 5  units with each meal 09/16/13  Yes [place], Modena Nunnery, MD  Insulin Glargine (LANTUS SOLOSTAR) 100 UNIT/ML Solostar Pen Inject 30 Units into the skin daily at 10 pm. 02/22/14  Yes [place], Modena Nunnery, MD  metFORMIN (GLUCOPHAGE) 500 MG tablet Take 500 mg by mouth 2 (two) times daily with a meal.   Yes [provider]  montelukast (SINGULAIR) 10 MG tablet Take 10 mg by mouth at bedtime.   Yes [provider]  pravastatin (PRAVACHOL) 40 MG tablet Take 40 mg by mouth daily.   Yes [provider]  ranitidine (ZANTAC) 150 MG tablet Take 150 mg by mouth 2 (two) times daily.   Yes [provider]  albuterol (PROVENTIL HFA;VENTOLIN HFA) 108 (90 BASE) MCG/ACT inhaler Inhale 2 puffs into the lungs every 6 (six) hours as needed for wheezing or shortness of breath.    [provider]  CRESTOR 20 MG tablet TAKE 1 TABLET BY MOUTH EVERY DAY 09/22/13   [place], Modena Nunnery, MD  dicyclomine (BENTYL) 10 MG capsule TAKE 1 CAPSULE BY MOUTH EVERY 4 TO 6 HOURS AS NEEDED FOR DIARRHEA OR ABDOMINAL CRAMPS. 04/26/14   Alycia Rossetti, MD  doxycycline (VIBRAMYCIN) 100 MG capsule Take 1 capsule (100 mg total) by mouth 2 (two) times daily. 04/13/17   Tanna Furry, MD  FLUoxetine (PROZAC) 40 MG capsule TAKE 1 CAPSULE BY MOUTH DAILY. (DOSE CHANGE) 02/22/14   Alycia Rossetti, MD  fluticasone [facility]) 50 MCG/ACT nasal spray USE 2 SPRAYS IN [facility] NOSTRIL DAILY 02/22/14   [place], Modena Nunnery, MD  HYDROcodone-acetaminophen (NORCO/VICODIN) 5-325 MG per tablet Take 1 tablet by mouth every 4 (four) hours as needed. 05/08/14   Evalee Jefferson, PA-C  hyoscyamine (LEVSIN SL) 0.125 MG SL tablet Place 1 tablet (0.125 mg total) under the tongue every 4 (four) hours as needed. 12/01/13   Annitta Needs, NP  ibuprofen (ADVIL,MOTRIN) 800 MG tablet Take 1 tablet (800 mg total) by mouth 3 (three) times daily. 04/13/17   Tanna Furry, MD  levocetirizine (XYZAL) 5 MG tablet TAKE 1 TABLET IN THE EVENING. 04/21/14   [place], Modena Nunnery, MD  lisinopril (PRINIVIL,ZESTRIL) 20 MG tablet Take 20 mg by mouth daily.    [provider]  LORazepam (ATIVAN) 1 MG tablet Take 1 tablet (1 mg total) by mouth 3 (three) times daily as needed for anxiety. 09/22/14   Daleen Bo, MD  meclizine (ANTIVERT) 25 MG tablet TAKE 1 TABLET BY MOUTH THREE TIMES DAILY AS NEEDED 04/27/13   Alycia Rossetti, MD  metoprolol succinate (TOPROL-XL) 100 MG 24 hr tablet Take 100 mg by mouth daily. Take with or immediately following a meal.  [provider]  ondansetron (ZOFRAN ODT) 4 MG disintegrating tablet 4mg  ODT q4 hours prn nausea/vomit 04/07/13   Evelina Bucy, MD  pantoprazole (PROTONIX) 40 MG tablet TAKE 1 TABLET BY MOUTH EVERY DAY (STOP OMEPRAZOLE) 02/22/14   Alycia Rossetti, MD    Family History Family History  Problem Relation Age of Onset  . Diabetes Mother   . Hypertension Mother   . Arthritis Mother   . Diabetes Sister   . Arthritis Father   . Kidney disease Father        kidney failure  . Cancer Maternal Grandmother        type?  Marland Kitchen Hypertension Son   . Seizures Sister   . Pneumonia Brother   . Early death Brother 9       goiter  . Hypertension Son   . Anesthesia problems Neg Hx   . Hypotension Neg Hx   . Malignant hyperthermia Neg Hx   . Pseudochol deficiency Neg Hx   . Colon cancer Neg Hx     Social History Social History  Substance Use Topics  . Smoking status: Never Smoker  . Smokeless tobacco: Never Used  . Alcohol use No     Comment: rare social drink     Allergies   Latex   Review of Systems Review of Systems  Constitutional: Positive for chills and fatigue. Negative for appetite change, diaphoresis and fever.  HENT: Negative for mouth sores, sore throat and trouble swallowing.   Eyes: Negative for visual disturbance.  Respiratory: Negative for cough, chest tightness, shortness of breath and wheezing.   Cardiovascular: Positive for chest pain.  Gastrointestinal: Negative for abdominal  distention, abdominal pain, diarrhea, nausea and vomiting.  Endocrine: Negative for polydipsia, polyphagia and polyuria.  Genitourinary: Negative for dysuria, frequency and hematuria.  Musculoskeletal: Positive for arthralgias. Negative for gait problem.  Skin: Negative for color change, pallor and rash.  Neurological: Negative for dizziness, syncope, light-headedness and headaches.  Hematological: Does not bruise/bleed easily.  Psychiatric/Behavioral: Negative for behavioral problems and confusion.     Physical Exam Updated Vital Signs BP (!) 147/61   Pulse 99   Temp 99.9 F (37.7 C) (Oral)   Resp 18   Ht 5\' 2"  (1.575 m)   Wt 69.9 kg (154 lb)   SpO2 97%   BMI 28.17 kg/m   Physical Exam  Constitutional: She is oriented to person, place, and time. She appears well-developed and well-nourished. No distress.  Awake and alert 61 year old female. Heart rate 110. Recheck oral temp by myself 101.6. Franks normal.  HENT:  Head: Normocephalic.  Eyes: Pupils are equal, round, and reactive to light. Conjunctivae are normal. No scleral icterus.  Neck: Normal range of motion. Neck supple. No thyromegaly present.  Cardiovascular: Regular rhythm.  Exam reveals no gallop and no friction rub.   No murmur heard. Sinus tach 116  Pulmonary/Chest: Effort normal and breath sounds normal. No respiratory distress. She has no wheezes. She has no rales.  Crackles right anterior lower base  Abdominal: Soft. Bowel sounds are normal. She exhibits no distension. There is no tenderness. There is no rebound.  Musculoskeletal: Normal range of motion.  Abrasion to left lower leg with erythema and small bullae consistent with probable secondary infection. No effusion to either knee. Full range of motion of both knees. It is diffusely to the lumbar paraspinal musculature.  Neurological: She is alert and oriented to person, place, and time.  Skin: Skin is warm and dry. No rash noted.  Psychiatric: She has a  normal mood and affect. Her behavior is normal.     ED Treatments / Results  Labs (all labs ordered are listed, but only abnormal results are displayed) Labs Reviewed  CBC WITH DIFFERENTIAL/PLATELET - Abnormal; Notable for the following:       Result Value   RBC 3.72 (*)    Hemoglobin 11.2 (*)    HCT 33.9 (*)    All other components within normal limits  BASIC METABOLIC PANEL - Abnormal; Notable for the following:    Chloride 100 (*)    Glucose, Bld 276 (*)    All other components within normal limits    EKG  EKG Interpretation None       Radiology Dg Chest 1 View  Result Date: 04/13/2017 CLINICAL DATA:  Chest pain EXAM: CHEST 1 VIEW COMPARISON:  04/11/2017 FINDINGS: The heart size and mediastinal contours are within normal limits. Both lungs are clear. The visualized skeletal structures are unremarkable. IMPRESSION: No active disease. Electronically Signed   By: Kerby Moors M.D.   On: 04/13/2017 20:59   Dg Lumbar Spine Complete  Result Date: 04/13/2017 CLINICAL DATA:  Fall EXAM: LUMBAR SPINE - COMPLETE 4+ VIEW COMPARISON:  Lumbar spine radiograph 02/16/2013 FINDINGS: No compression fracture. Alignment is normal. There distal large anterior osteophytes at the L3-4 level. Mild multilevel lower lumbar facet hypertrophy. Multiple left-sided lateral osteophytes. Disc spaces are preserved. IMPRESSION: No acute abnormality of the lumbar spine. Mild degenerative changes. Electronically Signed   By: Ulyses Jarred M.D.   On: 04/13/2017 20:59    Procedures Procedures (including critical care time)  Medications Ordered in ED Medications  doxycycline (VIBRA-TABS) tablet 100 mg (not administered)  acetaminophen (TYLENOL) tablet 650 mg (650 mg Oral Given 04/13/17 1956)  ibuprofen (ADVIL,MOTRIN) tablet 800 mg (800 mg Oral Given 04/13/17 1957)     Initial Impression / Assessment and Plan / ED Course  I have reviewed the triage vital signs and the nursing notes.  Pertinent labs &  imaging results that were available during my care of the patient were reviewed by me and considered in my medical decision making (see chart for details).    Given Motrin and Tylenol. Temperature and pain improved. Chest x-ray does not show obvious infiltrate. Lumbar spine films show no acute processes. No leukocytosis. Normal CMP. Plan doxycycline which revealed good pulmonary, as well as skin coverage. Motrin and/or Tylenol for pain or fever. Primary care follow-up.  Final Clinical Impressions(s) / ED Diagnoses   Final diagnoses:  Fever, unspecified fever cause  Multiple contusions    New Prescriptions New Prescriptions   DOXYCYCLINE (VIBRAMYCIN) 100 MG CAPSULE    Take 1 capsule (100 mg total) by mouth 2 (two) times daily.   IBUPROFEN (ADVIL,MOTRIN) 800 MG TABLET    Take 1 tablet (800 mg total) by mouth 3 (three) times daily.     Tanna Furry, MD 04/13/17 2124    Tanna Furry, MD 04/13/17 2125

## 2017-04-13 NOTE — ED Notes (Signed)
Patient transported to X-ray 

## 2017-04-13 NOTE — ED Triage Notes (Signed)
Pt fell Thursday in her bathroom. No LOC. Pt complaining of chronic left knee pain and generalized "right side" pain. NAD.

## 2017-04-26 ENCOUNTER — Other Ambulatory Visit (HOSPITAL_COMMUNITY): Payer: Self-pay | Admitting: Family Medicine

## 2017-04-26 DIAGNOSIS — Z1231 Encounter for screening mammogram for malignant neoplasm of breast: Secondary | ICD-10-CM

## 2017-05-03 ENCOUNTER — Ambulatory Visit (HOSPITAL_COMMUNITY): Payer: Self-pay

## 2017-05-08 ENCOUNTER — Ambulatory Visit (HOSPITAL_COMMUNITY): Payer: Self-pay

## 2017-06-26 ENCOUNTER — Encounter (INDEPENDENT_AMBULATORY_CARE_PROVIDER_SITE_OTHER): Payer: Self-pay | Admitting: Ophthalmology

## 2017-07-08 ENCOUNTER — Encounter (INDEPENDENT_AMBULATORY_CARE_PROVIDER_SITE_OTHER): Payer: Self-pay | Admitting: Ophthalmology

## 2017-07-12 ENCOUNTER — Encounter (INDEPENDENT_AMBULATORY_CARE_PROVIDER_SITE_OTHER): Payer: Self-pay | Admitting: Ophthalmology

## 2017-08-05 ENCOUNTER — Encounter (INDEPENDENT_AMBULATORY_CARE_PROVIDER_SITE_OTHER): Payer: Medicaid Other | Admitting: Ophthalmology

## 2017-09-24 ENCOUNTER — Other Ambulatory Visit: Payer: Self-pay

## 2017-09-24 ENCOUNTER — Encounter (HOSPITAL_COMMUNITY): Payer: Self-pay | Admitting: Emergency Medicine

## 2017-09-24 ENCOUNTER — Emergency Department (HOSPITAL_COMMUNITY): Payer: Medicaid Other

## 2017-09-24 ENCOUNTER — Emergency Department (HOSPITAL_COMMUNITY)
Admission: EM | Admit: 2017-09-24 | Discharge: 2017-09-24 | Disposition: A | Discharge status: Home / Self Care | Payer: Medicaid Other | Attending: Emergency Medicine | Admitting: Emergency Medicine

## 2017-09-24 DIAGNOSIS — Z79899 Other long term (current) drug therapy: Secondary | ICD-10-CM | POA: Diagnosis not present

## 2017-09-24 DIAGNOSIS — J111 Influenza due to unidentified influenza virus with other respiratory manifestations: Secondary | ICD-10-CM | POA: Insufficient documentation

## 2017-09-24 DIAGNOSIS — E1165 Type 2 diabetes mellitus with hyperglycemia: Secondary | ICD-10-CM | POA: Diagnosis not present

## 2017-09-24 DIAGNOSIS — J45909 Unspecified asthma, uncomplicated: Secondary | ICD-10-CM | POA: Diagnosis not present

## 2017-09-24 DIAGNOSIS — R739 Hyperglycemia, unspecified: Secondary | ICD-10-CM

## 2017-09-24 DIAGNOSIS — Z7901 Long term (current) use of anticoagulants: Secondary | ICD-10-CM | POA: Insufficient documentation

## 2017-09-24 DIAGNOSIS — E1140 Type 2 diabetes mellitus with diabetic neuropathy, unspecified: Secondary | ICD-10-CM | POA: Insufficient documentation

## 2017-09-24 DIAGNOSIS — Z794 Long term (current) use of insulin: Secondary | ICD-10-CM | POA: Insufficient documentation

## 2017-09-24 DIAGNOSIS — Z8541 Personal history of malignant neoplasm of cervix uteri: Secondary | ICD-10-CM | POA: Diagnosis not present

## 2017-09-24 DIAGNOSIS — E1151 Type 2 diabetes mellitus with diabetic peripheral angiopathy without gangrene: Secondary | ICD-10-CM | POA: Insufficient documentation

## 2017-09-24 DIAGNOSIS — R05 Cough: Secondary | ICD-10-CM | POA: Diagnosis present

## 2017-09-24 DIAGNOSIS — R69 Illness, unspecified: Secondary | ICD-10-CM

## 2017-09-24 LAB — BASIC METABOLIC PANEL
ANION GAP: 13 (ref 5–15)
BUN: 11 mg/dL (ref 6–20)
CHLORIDE: 96 mmol/L — AB (ref 101–111)
CO2: 27 mmol/L (ref 22–32)
Calcium: 9.6 mg/dL (ref 8.9–10.3)
Creatinine, Ser: 0.83 mg/dL (ref 0.44–1.00)
GFR calc Af Amer: >60 mL/min (ref >60)
Glucose, Bld: 319 mg/dL — ABNORMAL HIGH (ref 65–99)
POTASSIUM: 3.8 mmol/L (ref 3.5–5.1)
SODIUM: 136 mmol/L (ref 135–145)

## 2017-09-24 LAB — CBG MONITORING, ED
GLUCOSE-CAPILLARY: 346 mg/dL — AB (ref 65–99)
GLUCOSE-CAPILLARY: 208 mg/dL — AB (ref 65–99)

## 2017-09-24 MED ORDER — BENZONATATE 100 MG PO CAPS: 200.0000 mg | ORAL_CAPSULE | Freq: Three times a day (TID) | ORAL | 0 refills | Status: DC | PRN

## 2017-09-24 MED ORDER — INSULIN ASPART 100 UNIT/ML ~~LOC~~ SOLN
7.0000 [IU] | Freq: Once | SUBCUTANEOUS | Status: AC
  Administered 2017-09-24: 7 [IU] via SUBCUTANEOUS
  Filled 2017-09-24: qty 1

## 2017-09-24 MED ORDER — BENZONATATE 100 MG PO CAPS
200.0000 mg | ORAL_CAPSULE | Freq: Once | ORAL | Status: AC
  Administered 2017-09-24: 200 mg via ORAL
  Filled 2017-09-24: qty 2

## 2017-09-24 NOTE — ED Provider Notes (Signed)
[facility] EMERGENCY DEPARTMENT Provider Note   CSN: [id number] Arrival date & time: 09/24/17  1740     History   Chief Complaint Chief Complaint  Patient presents with  . Cough    HPI Kathy Hardy is a 62 y.o. female with a past medical history as outlined below, most significant for asthma, insulin-dependent diabetes, hypertension and sleep apnea presenting with a 1 day history of flulike symptoms including cough which is been nonproductive, nasal congestion with clear rhinorrhea generalized body aches, subjective fever intermittently with chills along with general fatigue.  She denies chest pain, wheezing, nausea, vomiting or abdominal pain.  She has had no medications for her symptoms prior to arrival.  The history is provided by the patient.    Past Medical History:  Diagnosis Date  . Allergy   . Anxiety   . Arthritis   . Asthma   . DDD (degenerative disc disease)    lumbar  . Diabetes mellitus   . 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 GERD (gastroesophageal reflux disease)   . Glaucoma   . Hyperlipidemia   . Hypertension   . Insomnia   . Neuropathy   . Peripheral vascular disease ([place])   . 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 Shortness of breath    with exertion  . Sleep apnea    Stop Bang score of 4  . Stroke ([place])    TIAx 4,   . Substance abuse ([place])    cocaine  . Uterine cancer ([place]) 2000   unknown what type    Patient Active Problem List   Diagnosis Date Noted  . Scoliosis 07/12/2016  . Primary osteoarthritis of one knee 05/17/2014  . Dysphagia, unspecified(787.20) 12/01/2013  . Substance abuse ([place]) 11/20/2013  . Non compliance w medication regimen 09/14/2013  . Proliferative diabetic retinopathy ([place]) 06/11/2012  . Diabetic neuropathy ([place]) 02/28/2012  . Chronic back pain 01/03/2012  . IBS (irritable bowel syndrome) 10/24/2011  . Allergic rhinitis 08/16/2011  . Chronic constipation 06/18/2011  . Gait disturbance 03/16/2011  . Polio  osteopathy of lower leg ([place]) 03/16/2011  . Diabetes mellitus with complication ([place]) 16/96/7893  . Hyperlipidemia 03/15/2011  . Essential hypertension, benign 03/15/2011  . GAD (generalized anxiety disorder) 03/15/2011  . Chronic pain 03/15/2011    Past Surgical History:  Procedure Laterality Date  . CARPAL TUNNEL RELEASE Bilateral   . CHOLECYSTECTOMY     [place]  . COLONOSCOPY  07/09/2011   YBO:[id number] hemorrhoids/Poor prep in the right colon, repeat in 2017  . ESOPHAGOGASTRODUODENOSCOPY  07/09/2011   NID:POEU gastritis  . HERNIA REPAIR    . PARS PLANA VITRECTOMY  07/01/2012   Procedure: PARS PLANA VITRECTOMY WITH 25 GAUGE;  Surgeon: Hayden Pedro, MD;  Location: [facility];  Service: Ophthalmology;  Laterality: Right;  with Laser treatment, Membrane peel, Gas Injection and Repair of Traction Retinal Detachment RIGHT eye  . RETINAL DETACHMENT SURGERY Right 07/01/2012  . Right leg surgery x 4 (polio)      OB History    Gravida Para Term Preterm AB Living   2 2 2     2    SAB TAB Ectopic Multiple Live Births                   Home Medications    Prior to Admission medications   Medication Sig Start Date End Date Taking? Authorizing Provider  albuterol (PROVENTIL HFA;VENTOLIN HFA) 108 (90 BASE) MCG/ACT inhaler Inhale 2  puffs into the lungs every 6 (six) hours as needed for wheezing or shortness of breath.   Yes [provider]  amLODipine (NORVASC) 5 MG tablet TAKE 1 TABLET BY MOUTH DAILY. 10/28/13  Yes [place], Modena Nunnery, MD  clopidogrel (PLAVIX) 75 MG tablet TAKE 1 TABLET BY MOUTH EVERY MORNING - PT WANTS SUN PHARM. BRAND Patient taking differently: TAKE 1 TABLET BY MOUTH EVERY MORNING - 12/25/13  Yes [place], Modena Nunnery, MD  CRESTOR 20 MG tablet TAKE 1 TABLET BY MOUTH EVERY DAY 09/22/13  Yes [place], Modena Nunnery, MD  dicyclomine (BENTYL) 10 MG capsule TAKE 1 CAPSULE BY MOUTH EVERY 4 TO 6 HOURS AS NEEDED FOR DIARRHEA OR ABDOMINAL CRAMPS. 04/26/14  Yes [place], Modena Nunnery, MD    FLUoxetine (PROZAC) 40 MG capsule TAKE 1 CAPSULE BY MOUTH DAILY. (DOSE CHANGE) 02/22/14  Yes [place], Modena Nunnery, MD  furosemide (LASIX) 20 MG tablet TAKE 1 TABLET BY MOUTH DAILY 10/28/13  Yes [place], Modena Nunnery, MD  gabapentin (NEURONTIN) 300 MG capsule TAKE 1 CAPSULE BY MOUTH AT BEDTIME Patient taking differently: TAKE ONE CAPSULE BY MOUTH THREE TIMES DAILY 10/28/13  Yes [place], Modena Nunnery, MD  HYDROcodone-acetaminophen (NORCO/VICODIN) 5-325 MG per tablet Take 1 tablet by mouth every 4 (four) hours as needed. 05/08/14  Yes Sascha Baugher, Almyra Free, PA-C  hydrOXYzine (ATARAX/VISTARIL) 25 MG tablet Take 25 mg by mouth 3 (three) times daily as needed for anxiety or itching.   Yes [provider]  hyoscyamine (LEVSIN SL) 0.125 MG SL tablet Place 1 tablet (0.125 mg total) under the tongue every 4 (four) hours as needed. 12/01/13  Yes Annitta Needs, NP  insulin aspart (NOVOLOG) 100 UNIT/ML FlexPen Inject 5 Units into the skin 3 (three) times daily with meals. Give 5 units with each meal 09/16/13  Yes [place], Modena Nunnery, MD  Insulin Glargine (LANTUS SOLOSTAR) 100 UNIT/ML Solostar Pen Inject 30 Units into the skin daily at 10 pm. 02/22/14  Yes [place], Modena Nunnery, MD  levocetirizine (XYZAL) 5 MG tablet TAKE 1 TABLET IN THE EVENING. 04/21/14  Yes [place], Modena Nunnery, MD  lisinopril (PRINIVIL,ZESTRIL) 20 MG tablet Take 20 mg by mouth daily.   Yes [provider]  meclizine (ANTIVERT) 25 MG tablet TAKE 1 TABLET BY MOUTH THREE TIMES DAILY AS NEEDED 04/27/13  Yes [place], Modena Nunnery, MD  metFORMIN (GLUCOPHAGE) 500 MG tablet Take 500 mg by mouth 2 (two) times daily with a meal.   Yes [provider]  metoprolol succinate (TOPROL-XL) 100 MG 24 hr tablet Take 100 mg by mouth daily. Take with or immediately following a meal.   Yes [provider]  montelukast (SINGULAIR) 10 MG tablet Take 10 mg by mouth at bedtime.   Yes [provider]  pantoprazole (PROTONIX) 40 MG tablet TAKE 1 TABLET BY MOUTH EVERY  DAY (STOP OMEPRAZOLE) 02/22/14  Yes [place], Modena Nunnery, MD  pravastatin (PRAVACHOL) 40 MG tablet Take 40 mg by mouth daily.   Yes [provider]  ranitidine (ZANTAC) 150 MG tablet Take 150 mg by mouth 2 (two) times daily.   Yes [provider]  benzonatate (TESSALON) 100 MG capsule Take 2 capsules (200 mg total) by mouth 3 (three) times daily as needed. 09/24/17   Evalee Jefferson, PA-C  doxycycline (VIBRAMYCIN) 100 MG capsule Take 1 capsule (100 mg total) by mouth 2 (two) times daily. 04/13/17   Tanna Furry, MD  LORazepam (ATIVAN) 1 MG tablet Take 1 tablet (1 mg total) by mouth 3 (three) times daily  as needed for anxiety. 09/22/14   Daleen Bo, MD    Family History Family History  Problem Relation Age of Onset  . Diabetes Mother   . Hypertension Mother   . Arthritis Mother   . Diabetes Sister   . Arthritis Father   . Kidney disease Father        kidney failure  . Cancer Maternal Grandmother        type?  Marland Kitchen Hypertension Son   . Seizures Sister   . Pneumonia Brother   . Early death Brother 71       goiter  . Hypertension Son   . Anesthesia problems Neg Hx   . Hypotension Neg Hx   . Malignant hyperthermia Neg Hx   . Pseudochol deficiency Neg Hx   . Colon cancer Neg Hx     Social History Social History   Tobacco Use  . Smoking status: Never Smoker  . Smokeless tobacco: Never Used  Substance Use Topics  . Alcohol use: No    Comment: rare social drink  . Drug use: No     Allergies   Latex and Aspirin   Review of Systems Review of Systems  Constitutional: Positive for chills and fever.  HENT: Positive for congestion and rhinorrhea. Negative for ear pain, sinus pressure, sore throat, trouble swallowing and voice change.   Eyes: Negative for discharge.  Respiratory: Positive for cough. Negative for shortness of breath, wheezing and stridor.   Cardiovascular: Negative for chest pain.  Gastrointestinal: Negative for abdominal pain.  Genitourinary:  Negative.   Musculoskeletal: Positive for myalgias.     Physical Exam Updated Vital Signs BP (!) 145/85 (BP Location: Right Arm)   Pulse 97   Temp 98.4 F (36.9 C) (Oral)   Resp 18   Ht 4\' 11"  (1.499 m)   Wt 65.8 kg (145 lb)   SpO2 98%   BMI 29.29 kg/m   Physical Exam  Constitutional: She is oriented to person, place, and time. She appears well-developed and well-nourished.  HENT:  Head: Normocephalic and atraumatic.  Right Ear: Tympanic membrane and ear canal normal.  Left Ear: Tympanic membrane and ear canal normal.  Nose: Mucosal edema and rhinorrhea present.  Mouth/Throat: Uvula is midline, oropharynx is clear and moist and mucous membranes are normal. No oropharyngeal exudate, posterior oropharyngeal edema, posterior oropharyngeal erythema or tonsillar abscesses.  Eyes: Conjunctivae are normal.  Cardiovascular: Normal rate and normal heart sounds.  Pulmonary/Chest: Effort normal. No respiratory distress. She has no wheezes. She has no rales. She exhibits no tenderness.  Frequent dry cough during exam.  No respiratory distress.  No wheezing.  Coarse breath sounds.  Abdominal: Soft. She exhibits no mass. There is no tenderness. There is no guarding.  Musculoskeletal: Normal range of motion.  Neurological: She is alert and oriented to person, place, and time.  Skin: Skin is warm and dry. No rash noted.  Psychiatric: She has a normal mood and affect.     ED Treatments / Results  Labs (all labs ordered are listed, but only abnormal results are displayed) Labs Reviewed  BASIC METABOLIC PANEL - Abnormal; Notable for the following components:      Result Value   Chloride 96 (*)    Glucose, Bld 319 (*)    All other components within normal limits  CBG MONITORING, ED - Abnormal; Notable for the following components:   Glucose-Capillary 346 (*)    All other components within normal limits  CBG MONITORING, ED - Abnormal;  Notable for the following components:    Glucose-Capillary 208 (*)    All other components within normal limits    EKG  EKG Interpretation None       Radiology Dg Chest 2 View  Result Date: 09/24/2017 CLINICAL DATA:  Productive cough, congestion, fever, chills EXAM: CHEST  2 VIEW COMPARISON:  09/15/2017 FINDINGS: Cardiomegaly. Mild peribronchial thickening. No confluent opacities or effusions. No acute bony abnormality. IMPRESSION: Cardiomegaly.  Mild bronchitic changes. Electronically Signed   By: Rolm Baptise M.D.   On: 09/24/2017 19:48    Procedures Procedures (including critical care time)  Medications Ordered in ED Medications  benzonatate (TESSALON) capsule 200 mg (not administered)  insulin aspart (novoLOG) injection 7 Units (7 Units Subcutaneous Given 09/24/17 2050)     Initial Impression / Assessment and Plan / ED Course  I have reviewed the triage vital signs and the nursing notes.  Pertinent labs & imaging results that were available during my care of the patient were reviewed by me and considered in my medical decision making (see chart for details).     Chest x-ray was reviewed and discussed with patient.  She has no pneumonia per this study.  CBG was checked and was 346.  Patient states she has had no appetite today, although did eat a small meal earlier this morning.  She has had none of her diabetes medications today since she has had reduced appetite.  She was given subcu insulin and her CBG at recheck was improved at 208.  BMET obtained to ensure that that she did not have a gap.  Tessalon prescribed for cough.  Encouraged rest, increase fluid intake, Tylenol if needed for fever reduction.  Follow-up with her PCP for any persistent or worsening symptoms.  I suspect this patient has  influenza.  Strict return precautions discussed.  Advised close watch on her CBGs.  Final Clinical Impressions(s) / ED Diagnoses   Final diagnoses:  Hyperglycemia  Influenza-like illness    ED Discharge Orders         Ordered    benzonatate (TESSALON) 100 MG capsule  3 times daily PRN     09/24/17 2158       Evalee Jefferson, PA-C 09/26/17 8889    Long, Wonda [place], MD 09/26/17 1324

## 2017-09-24 NOTE — Discharge Instructions (Signed)
Your symptoms are suggesting probable flu which we are seeing a lot of in this community.  Rest to make sure you are drinking plenty of fluids.  Make sure you are taking your insulin so your blood sugars stay under better control.  You may use the medicine prescribed if needed for persistent coughing.  I also recommend Tylenol or Motrin if needed for fever reduction.  See your doctor for any worsening symptoms or return here for recheck.

## 2017-09-24 NOTE — ED Triage Notes (Signed)
Pt c/o of cough, congestion, chills, fever, since yesterday.  No fever in triage. VSS.

## 2017-10-24 ENCOUNTER — Encounter: Payer: Self-pay | Admitting: Orthopedic Surgery

## 2017-12-06 ENCOUNTER — Encounter: Payer: Self-pay | Admitting: Cardiovascular Disease

## 2017-12-06 ENCOUNTER — Other Ambulatory Visit: Payer: Self-pay

## 2017-12-06 ENCOUNTER — Ambulatory Visit (INDEPENDENT_AMBULATORY_CARE_PROVIDER_SITE_OTHER): Payer: Medicaid Other | Admitting: Cardiovascular Disease

## 2017-12-06 VITALS — BP 173/92 | HR 90 | Ht 62.0 in | Wt 141.0 lb

## 2017-12-06 DIAGNOSIS — I5032 Chronic diastolic (congestive) heart failure: Secondary | ICD-10-CM | POA: Diagnosis not present

## 2017-12-06 DIAGNOSIS — Z8673 Personal history of transient ischemic attack (TIA), and cerebral infarction without residual deficits: Secondary | ICD-10-CM

## 2017-12-06 DIAGNOSIS — Z9289 Personal history of other medical treatment: Secondary | ICD-10-CM | POA: Diagnosis not present

## 2017-12-06 DIAGNOSIS — E1065 Type 1 diabetes mellitus with hyperglycemia: Secondary | ICD-10-CM

## 2017-12-06 DIAGNOSIS — E785 Hyperlipidemia, unspecified: Secondary | ICD-10-CM | POA: Diagnosis not present

## 2017-12-06 DIAGNOSIS — I10 Essential (primary) hypertension: Secondary | ICD-10-CM

## 2017-12-06 MED ORDER — CARVEDILOL 3.125 MG PO TABS: 3.1250 mg | ORAL_TABLET | Freq: Two times a day (BID) | ORAL | 1 refills | Status: DC

## 2017-12-06 NOTE — Progress Notes (Signed)
[place]

## 2017-12-06 NOTE — Patient Instructions (Signed)
Your physician recommends that you schedule a follow-up appointment in: [place] physician has recommended you make the following change in your medication:   START CARVEDILOL 3.125 MG TWICE DAILY  Thank you for choosing [facility]!!

## 2017-12-06 NOTE — Progress Notes (Signed)
CARDIOLOGY CONSULT NOTE  Patient ID: Kathy Hardy MRN: [id number] DOB/AGE: 60-Dec-1957 60 y.o.  Admit date: (Not on file) Primary Physician: Jani Gravel, MD Referring Physician: Dr. Maudie Mercury  Reason for Consultation: Diastolic heart failure  HPI: Kathy Hardy is a 62 y.o. female who is being seen today for the evaluation of diastolic heart failure at the request of Jani Gravel, MD.   She was hospitalized at [facility] in February 2019. I have personally reviewed all documentation, labs, radiographic and cardiovascular studies, and independently interpreted all ECG's.  She went from 181 pounds on admission 261 pounds at the time of discharge.  She is noted to have poorly controlled diabetes and possible gastroparesis.  She was discharged on Lasix 20 mg daily, hydralazine 50 mg twice daily, amlodipine 5 mg, and Lipitor 40 mg.  I reviewed the echocardiogram report which demonstrated normal left ventricular systolic function, EF 55 to 60%, mild concentric LVH, grade 2 diastolic dysfunction with elevated left ventricular end-diastolic pressures, mild left atrial dilatation, and mild tricuspid regurgitation with moderate pulmonary hypertension.  Lower externally Doppler showed no evidence of DVT.  I personally reviewed an ECG performed on 09/13/2017 which demonstrated sinus tachycardia with right bundle branch block, 101 bpm.  Creatinine was 0.62 on admission, rising to 1.34, and final value was 1.11.  I reviewed labs dated 11/04/2017 from her PCP: Hemoglobin 11.1, platelets 267, BUN 14, creatinine 0.85, sodium 141, potassium 3.8, total cholesterol 134, triglycerides 58, HDL 47, LDL 75, HbA1c 10.2%.  She weighs 141 pounds today.  She has a host of somatic complaints.  She said last night she had an episode of chest pain and shortness of breath and her heart was racing.  She said this is the first time this is happened since she was hospitalized.  There is a nurses aide present with  her today.  The patient told me she needs a new blood pressure cuff and was unable to check her blood pressure and heart rate last night.  She also told me she has a history of anxiety attacks.  She said she has occasional hand and feet swelling.   Allergies  Allergen Reactions  . Latex Hives    Hives only  . Aspirin     Swelling, hives     Current Outpatient Medications  Medication Sig Dispense Refill  . albuterol (PROVENTIL HFA;VENTOLIN HFA) 108 (90 BASE) MCG/ACT inhaler Inhale 2 puffs into the lungs every 6 (six) hours as needed for wheezing or shortness of breath.    Marland Kitchen amLODipine (NORVASC) 5 MG tablet TAKE 1 TABLET BY MOUTH DAILY. 30 tablet 6  . clopidogrel (PLAVIX) 75 MG tablet TAKE 1 TABLET BY MOUTH EVERY MORNING - PT WANTS SUN PHARM. BRAND (Patient taking differently: TAKE 1 TABLET BY MOUTH EVERY MORNING -) 30 tablet 3  . CRESTOR 20 MG tablet TAKE 1 TABLET BY MOUTH EVERY DAY 30 tablet 6  . dicyclomine (BENTYL) 10 MG capsule TAKE 1 CAPSULE BY MOUTH EVERY 4 TO 6 HOURS AS NEEDED FOR DIARRHEA OR ABDOMINAL CRAMPS. 120 capsule 0  . FLUoxetine (PROZAC) 40 MG capsule TAKE 1 CAPSULE BY MOUTH DAILY. (DOSE CHANGE) 30 capsule 3  . gabapentin (NEURONTIN) 300 MG capsule TAKE 1 CAPSULE BY MOUTH AT BEDTIME (Patient taking differently: TAKE ONE CAPSULE BY MOUTH THREE TIMES DAILY) 30 capsule 6  . hydrOXYzine (ATARAX/VISTARIL) 25 MG tablet Take 25 mg by mouth 3 (three) times daily as needed for anxiety or itching.    Marland Kitchen  hyoscyamine (LEVSIN SL) 0.125 MG SL tablet Place 1 tablet (0.125 mg total) under the tongue every 4 (four) hours as needed. 120 tablet 3  . Insulin Glargine (LANTUS SOLOSTAR) 100 UNIT/ML Solostar Pen Inject 30 Units into the skin daily at 10 pm. 10 pen 3  . levocetirizine (XYZAL) 5 MG tablet TAKE 1 TABLET IN THE EVENING. 30 tablet 0  . lisinopril (PRINIVIL,ZESTRIL) 20 MG tablet Take 20 mg by mouth daily.    Marland Kitchen LORazepam (ATIVAN) 1 MG tablet Take 1 tablet (1 mg total) by mouth 3  (three) times daily as needed for anxiety. 20 tablet 0  . meclizine (ANTIVERT) 25 MG tablet TAKE 1 TABLET BY MOUTH THREE TIMES DAILY AS NEEDED 30 tablet 0  . metFORMIN (GLUCOPHAGE) 500 MG tablet Take 500 mg by mouth 2 (two) times daily with a meal.    . montelukast (SINGULAIR) 10 MG tablet Take 10 mg by mouth at bedtime.    . pantoprazole (PROTONIX) 40 MG tablet TAKE 1 TABLET BY MOUTH EVERY DAY (STOP OMEPRAZOLE) 30 tablet 3   Current Facility-Administered Medications  Medication Dose Route Frequency Provider Last Rate Last Dose  . Influenza (>/= 3 years) inactive virus vaccine (FLVIRIN/FLUZONE) injection SUSP 0.5 mL  0.5 mL Intramuscular Once Fayrene Helper, MD        Past Medical History:  Diagnosis Date  . Allergy   . Anxiety   . Arthritis   . Asthma   . DDD (degenerative disc disease)    lumbar  . Diabetes mellitus   . 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 GERD (gastroesophageal reflux disease)   . Glaucoma   . Hyperlipidemia   . Hypertension   . Insomnia   . Neuropathy   . Peripheral vascular disease ([place])   . 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 Shortness of breath    with exertion  . Sleep apnea    Stop Bang score of 4  . Stroke ([place])    TIAx 4,   . Substance abuse ([place])    cocaine  . Uterine cancer ([place]) 2000   unknown what type    Past Surgical History:  Procedure Laterality Date  . CARPAL TUNNEL RELEASE Bilateral   . CHOLECYSTECTOMY     [place]  . COLONOSCOPY  07/09/2011   [id number] hemorrhoids/Poor prep in the right colon, repeat in 2017  . ESOPHAGOGASTRODUODENOSCOPY  07/09/2011   FOY:DXAJ gastritis  . HERNIA REPAIR    . PARS PLANA VITRECTOMY  07/01/2012   Procedure: PARS PLANA VITRECTOMY WITH 25 GAUGE;  Surgeon: Hayden Pedro, MD;  Location: [place];  Service: Ophthalmology;  Laterality: Right;  with Laser treatment, Membrane peel, Gas Injection and Repair of Traction Retinal Detachment RIGHT eye  . RETINAL DETACHMENT SURGERY Right 07/01/2012    . Right leg surgery x 4 (polio)      Social History   Socioeconomic History  . Marital status: Legally Separated    Spouse name: Not on file  . Number of children: 2  . Years of education: 73  . Highest education level: Not on file  Occupational History  . Occupation: disabled    Comment: "sugar", orthopedic  Social Needs  . Financial resource strain: Not on file  . Food insecurity:    Worry: Not on file    Inability: Not on file  . Transportation needs:    Medical: Not on file    Non-medical: Not on file  Tobacco Use  .  Smoking status: Never Smoker  . Smokeless tobacco: Never Used  Substance and Sexual Activity  . Alcohol use: No    Comment: rare social drink  . Drug use: No  . Sexual activity: Not Currently    Birth control/protection: None  Lifestyle  . Physical activity:    Days per week: Not on file    Minutes per session: Not on file  . Stress: Not on file  Relationships  . Social connections:    Talks on phone: Not on file    Gets together: Not on file    Attends religious service: Not on file    Active member of club or organization: Not on file    Attends meetings of clubs or organizations: Not on file    Relationship status: Not on file  . Intimate partner violence:    Fear of current or ex partner: Not on file    Emotionally abused: Not on file    Physically abused: Not on file    Forced sexual activity: Not on file  Other Topics Concern  . Not on file  Social History Narrative   Lives at home with boyfriend   Disabled from back/leg/diabetes     No family history of premature CAD in 1st degree relatives.  Current Meds  Medication Sig  . albuterol (PROVENTIL HFA;VENTOLIN HFA) 108 (90 BASE) MCG/ACT inhaler Inhale 2 puffs into the lungs every 6 (six) hours as needed for wheezing or shortness of breath.  Marland Kitchen amLODipine (NORVASC) 5 MG tablet TAKE 1 TABLET BY MOUTH DAILY.  Marland Kitchen clopidogrel (PLAVIX) 75 MG tablet TAKE 1 TABLET BY MOUTH EVERY MORNING - PT  WANTS SUN PHARM. BRAND (Patient taking differently: TAKE 1 TABLET BY MOUTH EVERY MORNING -)  . CRESTOR 20 MG tablet TAKE 1 TABLET BY MOUTH EVERY DAY  . dicyclomine (BENTYL) 10 MG capsule TAKE 1 CAPSULE BY MOUTH EVERY 4 TO 6 HOURS AS NEEDED FOR DIARRHEA OR ABDOMINAL CRAMPS.  Marland Kitchen FLUoxetine (PROZAC) 40 MG capsule TAKE 1 CAPSULE BY MOUTH DAILY. (DOSE CHANGE)  . gabapentin (NEURONTIN) 300 MG capsule TAKE 1 CAPSULE BY MOUTH AT BEDTIME (Patient taking differently: TAKE ONE CAPSULE BY MOUTH THREE TIMES DAILY)  . hydrOXYzine (ATARAX/VISTARIL) 25 MG tablet Take 25 mg by mouth 3 (three) times daily as needed for anxiety or itching.  . hyoscyamine (LEVSIN SL) 0.125 MG SL tablet Place 1 tablet (0.125 mg total) under the tongue every 4 (four) hours as needed.  . Insulin Glargine (LANTUS SOLOSTAR) 100 UNIT/ML Solostar Pen Inject 30 Units into the skin daily at 10 pm.  . levocetirizine (XYZAL) 5 MG tablet TAKE 1 TABLET IN THE EVENING.  Marland Kitchen lisinopril (PRINIVIL,ZESTRIL) 20 MG tablet Take 20 mg by mouth daily.  Marland Kitchen LORazepam (ATIVAN) 1 MG tablet Take 1 tablet (1 mg total) by mouth 3 (three) times daily as needed for anxiety.  . meclizine (ANTIVERT) 25 MG tablet TAKE 1 TABLET BY MOUTH THREE TIMES DAILY AS NEEDED  . metFORMIN (GLUCOPHAGE) 500 MG tablet Take 500 mg by mouth 2 (two) times daily with a meal.  . montelukast (SINGULAIR) 10 MG tablet Take 10 mg by mouth at bedtime.  . pantoprazole (PROTONIX) 40 MG tablet TAKE 1 TABLET BY MOUTH EVERY DAY (STOP OMEPRAZOLE)   Current Facility-Administered Medications for the 12/06/17 encounter (Office Visit) with Herminio Commons, MD  Medication  . Influenza (>/= 3 years) inactive virus vaccine (FLVIRIN/FLUZONE) injection SUSP 0.5 mL      Review of systems complete and found  to be negative unless listed above in HPI    Physical exam Blood pressure (!) 173/92, pulse 90, height 5\' 2"  (1.575 m), weight 141 lb (64 kg), SpO2 99 %. General: NAD Neck: No JVD, no thyromegaly  or thyroid nodule.  Lungs: Clear to auscultation bilaterally with normal respiratory effort. CV: Nondisplaced PMI. Regular rate and rhythm, normal S1/S2, no S3/S4, no murmur.  No peripheral edema.   Abdomen: Soft, nontender, no distention.  Skin: Intact without lesions or rashes.  Neurologic: Alert and oriented x 3.  Psych: Normal affect. Extremities: No clubbing or cyanosis.  HEENT: Normal.   ECG: Most recent ECG reviewed.   Labs: Lab Results  Component Value Date/Time   K 3.8 09/24/2017 08:55 PM   BUN 11 09/24/2017 08:55 PM   CREATININE 0.83 09/24/2017 08:55 PM   CREATININE 0.85 09/14/2013 10:18 AM   ALT 13 09/22/2014 12:26 PM   TSH 2.557 09/14/2013 10:18 AM   HGB 11.2 (L) 04/13/2017 08:08 PM     Lipids: Lab Results  Component Value Date/Time   LDLCALC 66 09/14/2013 10:18 AM   LDLDIRECT 122 (H) 10/23/2012 11:25 AM   CHOL 138 09/14/2013 10:18 AM   TRIG 79 09/14/2013 10:18 AM   HDL 56 09/14/2013 10:18 AM        ASSESSMENT AND PLAN:   1.  Chronic diastolic heart failure with exertional dyspnea: No longer on Lasix.  I will aim to control blood pressure.  She may be short of breath due to elevated heart rates with exertion.  I will start carvedilol 3.125 mill grams twice daily.  2.  Hyperlipidemia: Lipids reviewed above.  Continue Crestor 20 mg.    3.  Hypertension: Blood pressure is markedly elevated.  I will start carvedilol 3.125 mg twice daily.  4.  Poorly controlled diabetes mellitus: Currently on metformin and insulin.  Managed by PCP.  5.  History of multiple TIAs: Currently on Plavix and Crestor.    Disposition: Follow up in 3 months  Signed: Kate Sable, M.D., F.A.C.C.  12/06/2017, 10:35 AM

## 2017-12-13 ENCOUNTER — Ambulatory Visit: Payer: Medicaid Other | Admitting: Orthopedic Surgery

## 2017-12-13 NOTE — Progress Notes (Deleted)
NEW PATIENT OFFICE VISIT   No chief complaint on file.    MEDICAL DECISION SECTION  xrays ordered? ***  My independent reading of xrays: ***   Encounter Diagnoses  Name Primary?  . Chronic pain of left knee Yes  . Chronic pain syndrome   . Chronic low back pain, unspecified back pain laterality, with sciatica presence unspecified      PLAN: ***  No orders of the defined types were placed in this encounter.  Injection? *** MRI/CT/? ***      No chief complaint on file.   HPI  ROS   Past Medical History:  Diagnosis Date  . Allergy   . Anxiety   . Arthritis   . Asthma   . DDD (degenerative disc disease)    lumbar  . Diabetes mellitus   . 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 GERD (gastroesophageal reflux disease)   . Glaucoma   . Hyperlipidemia   . Hypertension   . Insomnia   . Neuropathy   . Peripheral vascular disease ([place])   . 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 Shortness of breath    with exertion  . Sleep apnea    Stop Bang score of 4  . Stroke ([place])    TIAx 4,   . Substance abuse ([place])    cocaine  . Uterine cancer ([place]) 2000   unknown what type    Past Surgical History:  Procedure Laterality Date  . CARPAL TUNNEL RELEASE Bilateral   . CHOLECYSTECTOMY     [place]  . COLONOSCOPY  07/09/2011   [id number] hemorrhoids/Poor prep in the right colon, repeat in 2017  . ESOPHAGOGASTRODUODENOSCOPY  07/09/2011   TZG:YFVC gastritis  . HERNIA REPAIR    . PARS PLANA VITRECTOMY  07/01/2012   Procedure: PARS PLANA VITRECTOMY WITH 25 GAUGE;  Surgeon: Hayden Pedro, MD;  Location: [place];  Service: Ophthalmology;  Laterality: Right;  with Laser treatment, Membrane peel, Gas Injection and Repair of Traction Retinal Detachment RIGHT eye  . RETINAL DETACHMENT SURGERY Right 07/01/2012  . Right leg surgery x 4 (polio)      Family History  Problem Relation Age of Onset  . Diabetes Mother   . Hypertension Mother   . Arthritis Mother   .  Diabetes Sister   . Arthritis Father   . Kidney disease Father        kidney failure  . Cancer Maternal Grandmother        type?  Marland Kitchen Hypertension Son   . Seizures Sister   . Pneumonia Brother   . Early death Brother 41       goiter  . Hypertension Son   . Anesthesia problems Neg Hx   . Hypotension Neg Hx   . Malignant hyperthermia Neg Hx   . Pseudochol deficiency Neg Hx   . Colon cancer Neg Hx    Social History   Tobacco Use  . Smoking status: Never Smoker  . Smokeless tobacco: Never Used  Substance Use Topics  . Alcohol use: No    Comment: rare social drink  . Drug use: No    @ALL @  No outpatient medications have been marked as taking for the 12/13/17 encounter (Appointment) with Carole Civil, MD.   Current Facility-Administered Medications for the 12/13/17 encounter (Appointment) with Carole Civil, MD  Medication  . Influenza (>/= 3 years) inactive virus vaccine (FLVIRIN/FLUZONE) injection SUSP 0.5 mL    There were no  vitals taken for this visit.  Physical Exam  Ortho Exam  @mecred @ 12/13/2017 8:23 AM

## 2017-12-17 ENCOUNTER — Encounter: Payer: Self-pay | Admitting: Orthopaedic Surgery

## 2017-12-19 ENCOUNTER — Encounter: Payer: Self-pay | Admitting: Orthopaedic Surgery

## 2017-12-19 ENCOUNTER — Ambulatory Visit (INDEPENDENT_AMBULATORY_CARE_PROVIDER_SITE_OTHER): Payer: Medicaid Other | Admitting: Orthopaedic Surgery

## 2017-12-19 ENCOUNTER — Ambulatory Visit: Payer: Medicaid Other

## 2017-12-19 ENCOUNTER — Ambulatory Visit (INDEPENDENT_AMBULATORY_CARE_PROVIDER_SITE_OTHER): Payer: Medicaid Other

## 2017-12-19 VITALS — BP 176/90 | HR 80 | Ht 62.0 in | Wt 137.0 lb

## 2017-12-19 DIAGNOSIS — G5731 Lesion of lateral popliteal nerve, right lower limb: Secondary | ICD-10-CM

## 2017-12-19 DIAGNOSIS — M25562 Pain in left knee: Secondary | ICD-10-CM

## 2017-12-19 DIAGNOSIS — M25561 Pain in right knee: Principal | ICD-10-CM

## 2017-12-19 DIAGNOSIS — G8929 Other chronic pain: Secondary | ICD-10-CM

## 2017-12-19 DIAGNOSIS — Z8612 Personal history of poliomyelitis: Secondary | ICD-10-CM

## 2017-12-19 NOTE — Progress Notes (Signed)
Dg knee   

## 2017-12-19 NOTE — Progress Notes (Signed)
Patient Kathy Hardy, female DOB:1956-02-17, 62 y.o. LPF:[id number]  Chief Complaint  Patient presents with  . Knee Pain    bilateral  . Difficulty Walking    has hernia repair 09/12/17 CVA during surgery was on respirator has had problems since  . Difficulty Walking    walks dragging right foot / states has had 7 surgeries for tendon repairs right leg  . Back Pain    has been told needs lumbar surgery at [place] is a 62 y.o. female who has developed new problems since I last saw her.  She had surgery at [facility] around 09-12-17.  Post operatively she had complications including congestive heart failure.  She was in the ICU for a period of time.  She is just now getting able to get around she says.  She is using a walker.  She had polio as a child and has a leg length problem with the right lower extremity shorter than the left.  She had polio on the right side.  She had weakness of the right ankle but was able to dorsiflex her foot, but now, after this illness, she has persistent weakness on the right and a foot drop.  She is tripping herself.  She uses a walker now to get around as she is so weak.  Both knees bother her.  She has pain in both knees with some swelling at times.  She has no giving way.   Her right hip hurts as well.  She is not taking anything for the knees.  She has no new injury.  She is seeing a heart doctor now.  She lost weight with the recent hospitalization. HPI  Body mass index is 25.06 kg/m.  ROS  Review of Systems  Constitutional: Positive for activity change and appetite change.  Respiratory: Positive for cough, chest tightness and shortness of breath.   Cardiovascular: Positive for chest pain and leg swelling.  Endocrine: Positive for cold intolerance.  Musculoskeletal: Positive for arthralgias, back pain, gait problem, joint swelling and myalgias.  Allergic/Immunologic: Positive for environmental allergies.   Neurological: Positive for weakness and headaches.  Psychiatric/Behavioral: The patient is nervous/anxious.     Past Medical History:  Diagnosis Date  . Allergy   . Anxiety   . Arthritis   . Asthma   . DDD (degenerative disc disease)    lumbar  . Diabetes mellitus   . 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 GERD (gastroesophageal reflux disease)   . Glaucoma   . Hyperlipidemia   . Hypertension   . Insomnia   . Neuropathy   . Peripheral vascular disease ([place])   . 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 Shortness of breath    with exertion  . Sleep apnea    Stop Bang score of 4  . Stroke ([place])    TIAx 4,   . Substance abuse ([place])    cocaine  . Uterine cancer ([place]) 2000   unknown what type    Past Surgical History:  Procedure Laterality Date  . CARPAL TUNNEL RELEASE Bilateral   . CHOLECYSTECTOMY     [facility]  . COLONOSCOPY  07/09/2011   [id number] hemorrhoids/Poor prep in the right colon, repeat in 2017  . ESOPHAGOGASTRODUODENOSCOPY  07/09/2011   [id number] gastritis  . HERNIA REPAIR    . PARS PLANA VITRECTOMY  07/01/2012   Procedure: PARS PLANA VITRECTOMY WITH 25 GAUGE;  Surgeon: Chrystie Nose  Zigmund Daniel, MD;  Location: [facility];  Service: Ophthalmology;  Laterality: Right;  with Laser treatment, Membrane peel, Gas Injection and Repair of Traction Retinal Detachment RIGHT eye  . RETINAL DETACHMENT SURGERY Right 07/01/2012  . Right leg surgery x 4 (polio)      Family History  Problem Relation Age of Onset  . Diabetes Mother   . Hypertension Mother   . Arthritis Mother   . Diabetes Sister   . Arthritis Father   . Kidney disease Father        kidney failure  . Cancer Maternal Grandmother        type?  Marland Kitchen Hypertension Son   . Seizures Sister   . Pneumonia Brother   . Early death Brother 63       goiter  . Hypertension Son   . Anesthesia problems Neg Hx   . Hypotension Neg Hx   . Malignant hyperthermia Neg Hx   . Pseudochol deficiency Neg Hx   . Colon cancer Neg Hx      Social History Social History   Tobacco Use  . Smoking status: Never Smoker  . Smokeless tobacco: Never Used  Substance Use Topics  . Alcohol use: No    Comment: rare social drink  . Drug use: No    Allergies  Allergen Reactions  . Latex Hives    Hives only  . Aspirin     Swelling, hives     Current Outpatient Medications  Medication Sig Dispense Refill  . albuterol (PROVENTIL HFA;VENTOLIN HFA) 108 (90 BASE) MCG/ACT inhaler Inhale 2 puffs into the lungs every 6 (six) hours as needed for wheezing or shortness of breath.    Marland Kitchen amLODipine (NORVASC) 5 MG tablet TAKE 1 TABLET BY MOUTH DAILY. 30 tablet 6  . carvedilol (COREG) 3.125 MG tablet Take 1 tablet (3.125 mg total) by mouth 2 (two) times daily. 180 tablet 1  . clopidogrel (PLAVIX) 75 MG tablet TAKE 1 TABLET BY MOUTH EVERY MORNING - PT WANTS SUN PHARM. BRAND (Patient taking differently: TAKE 1 TABLET BY MOUTH EVERY MORNING -) 30 tablet 3  . CRESTOR 20 MG tablet TAKE 1 TABLET BY MOUTH EVERY DAY 30 tablet 6  . dicyclomine (BENTYL) 10 MG capsule TAKE 1 CAPSULE BY MOUTH EVERY 4 TO 6 HOURS AS NEEDED FOR DIARRHEA OR ABDOMINAL CRAMPS. 120 capsule 0  . FLUoxetine (PROZAC) 40 MG capsule TAKE 1 CAPSULE BY MOUTH DAILY. (DOSE CHANGE) 30 capsule 3  . gabapentin (NEURONTIN) 300 MG capsule TAKE 1 CAPSULE BY MOUTH AT BEDTIME (Patient taking differently: TAKE ONE CAPSULE BY MOUTH THREE TIMES DAILY) 30 capsule 6  . hydrOXYzine (ATARAX/VISTARIL) 25 MG tablet Take 25 mg by mouth 3 (three) times daily as needed for anxiety or itching.    . hyoscyamine (LEVSIN SL) 0.125 MG SL tablet Place 1 tablet (0.125 mg total) under the tongue every 4 (four) hours as needed. 120 tablet 3  . Insulin Glargine (LANTUS SOLOSTAR) 100 UNIT/ML Solostar Pen Inject 30 Units into the skin daily at 10 pm. 10 pen 3  . levocetirizine (XYZAL) 5 MG tablet TAKE 1 TABLET IN THE EVENING. 30 tablet 0  . lisinopril (PRINIVIL,ZESTRIL) 20 MG tablet Take 20 mg by mouth daily.    Marland Kitchen  LORazepam (ATIVAN) 1 MG tablet Take 1 tablet (1 mg total) by mouth 3 (three) times daily as needed for anxiety. 20 tablet 0  . meclizine (ANTIVERT) 25 MG tablet TAKE 1 TABLET BY MOUTH THREE TIMES DAILY AS NEEDED 30 tablet  0  . metFORMIN (GLUCOPHAGE) 500 MG tablet Take 500 mg by mouth 2 (two) times daily with a meal.    . montelukast (SINGULAIR) 10 MG tablet Take 10 mg by mouth at bedtime.    . pantoprazole (PROTONIX) 40 MG tablet TAKE 1 TABLET BY MOUTH EVERY DAY (STOP OMEPRAZOLE) 30 tablet 3   Current Facility-Administered Medications  Medication Dose Route Frequency Provider Last Rate Last Dose  . Influenza (>/= 3 years) inactive virus vaccine (FLVIRIN/FLUZONE) injection SUSP 0.5 mL  0.5 mL Intramuscular Once Fayrene Helper, MD         Physical Exam  Blood pressure (!) 176/90, pulse 80, height 5\' 2"  (1.575 m), weight 137 lb (62.1 kg).  Constitutional: overall normal hygiene, normal nutrition, well developed, normal grooming, normal body habitus. Assistive device:walker  Musculoskeletal: gait and station Limp right, muscle tone and strength are normal upper, weakness of the right lower extremity from prior polio with atrophy, no tremor is present.  .  Neurological: coordination overall normal.  Deep tendon reflex/nerve stretch intact upper, right lower with decreased reflexes secondary to old polio.  Sensation normal.  Cranial nerves II-XII intact.   Skin:   Normal overall no scars, lesions, ulcers or rashes except of abdomen. No psoriasis.  Psychiatric: Alert and oriented x 3.  Recent memory intact, remote memory unclear.  Normal mood and affect. Well groomed.  Good eye contact.  Cardiovascular: overall no swelling, no varicosities, no edema bilaterally, normal temperatures of the legs and arms, no clubbing, cyanosis and good capillary refill.  Lymphatic: palpation is normal.  The bilateral lower extremity is examined:  Inspection:  Thigh:  Non-tender and no defects  Knee has  swelling 1/2+ effusion.                        Joint tenderness is present                        Patient is tender over the medial joint line  Lower Leg:  Has normal appearance and no tenderness or defects  Ankle:  Non-tender and no defects  Foot:  Non-tender and no defects Range of Motion:  Knee:  Range of motion is: 0- 100 right, 0-110 left                        Crepitus is  present  Ankle:  Range of motion is normal. Strength and Tone:  The left lower extremity has normal strength and tone.  Right with atrophy from old polio. Right foot drop present, peroneal palsy right. Stability:  Knee:  The knee are stable.  Ankle:  The ankle is stable.   X-rays were done of both knees, reported separately.  All other systems reviewed and are negative   The patient has been educated about the nature of the problem(s) and counseled on treatment options.  The patient appeared to understand what I have discussed and is in agreement with it.  Encounter Diagnoses  Name Primary?  . Chronic pain of both knees Yes  . Right peroneal nerve palsy   . Hx of post-polio syndrome     PLAN Call if any problems.  Precautions discussed.  Continue current medications.  PROCEDURE NOTE:  The patient requests injections of the left knee , verbal consent was obtained.  The left knee was prepped appropriately after time out was performed.   Sterile technique was observed and  injection of 1 cc of Depo-Medrol 40 mg with several cc's of plain xylocaine. Anesthesia was provided by ethyl chloride and a 20-gauge needle was used to inject the knee area. The injection was tolerated well.  A band aid dressing was applied.  The patient was advised to apply ice later today and tomorrow to the injection sight as needed.  PROCEDURE NOTE:  The patient requests injections of the right knee , verbal consent was obtained.  The right knee was prepped appropriately after time out was performed.   Sterile technique was  observed and injection of 1 cc of Depo-Medrol 40 mg with several cc's of plain xylocaine. Anesthesia was provided by ethyl chloride and a 20-gauge needle was used to inject the knee area. The injection was tolerated well.  A band aid dressing was applied.  The patient was advised to apply ice later today and tomorrow to the injection sight as needed.  I have given Rx for AFO for the right lower extremity for the foot drop.   Return to clinic 2 weeks   Get X-rays of the right hip on return and leg length X-rays with stitching.  Electronically Signed Sanjuana Kava, MD 5/23/20191:01 PM

## 2018-01-02 ENCOUNTER — Ambulatory Visit: Payer: Medicaid Other | Admitting: Orthopaedic Surgery

## 2018-01-09 ENCOUNTER — Ambulatory Visit: Payer: Medicaid Other | Admitting: Orthopaedic Surgery

## 2018-01-09 ENCOUNTER — Encounter: Payer: Self-pay | Admitting: Orthopaedic Surgery

## 2018-01-25 ENCOUNTER — Other Ambulatory Visit: Payer: Self-pay | Admitting: Cardiovascular Disease

## 2018-01-29 ENCOUNTER — Encounter: Payer: Self-pay | Admitting: Orthopaedic Surgery

## 2018-01-29 ENCOUNTER — Ambulatory Visit: Payer: Medicaid Other | Admitting: Orthopaedic Surgery

## 2018-03-20 ENCOUNTER — Ambulatory Visit: Payer: Self-pay | Admitting: Cardiovascular Disease

## 2018-03-21 ENCOUNTER — Encounter (HOSPITAL_COMMUNITY): Payer: Self-pay | Admitting: *Deleted

## 2018-03-21 ENCOUNTER — Other Ambulatory Visit: Payer: Self-pay

## 2018-03-21 ENCOUNTER — Emergency Department (HOSPITAL_COMMUNITY)
Admission: EM | Admit: 2018-03-21 | Discharge: 2018-03-21 | Disposition: A | Discharge status: Home / Self Care | Payer: Medicaid Other | Attending: Emergency Medicine | Admitting: Emergency Medicine

## 2018-03-21 ENCOUNTER — Emergency Department (HOSPITAL_COMMUNITY): Payer: Medicaid Other

## 2018-03-21 DIAGNOSIS — E119 Type 2 diabetes mellitus without complications: Secondary | ICD-10-CM | POA: Insufficient documentation

## 2018-03-21 DIAGNOSIS — Z7902 Long term (current) use of antithrombotics/antiplatelets: Secondary | ICD-10-CM | POA: Diagnosis not present

## 2018-03-21 DIAGNOSIS — K2900 Acute gastritis without bleeding: Secondary | ICD-10-CM | POA: Diagnosis not present

## 2018-03-21 DIAGNOSIS — Z8673 Personal history of transient ischemic attack (TIA), and cerebral infarction without residual deficits: Secondary | ICD-10-CM | POA: Diagnosis not present

## 2018-03-21 DIAGNOSIS — R112 Nausea with vomiting, unspecified: Secondary | ICD-10-CM | POA: Diagnosis present

## 2018-03-21 DIAGNOSIS — Z87891 Personal history of nicotine dependence: Secondary | ICD-10-CM | POA: Diagnosis not present

## 2018-03-21 DIAGNOSIS — Z79899 Other long term (current) drug therapy: Secondary | ICD-10-CM | POA: Diagnosis not present

## 2018-03-21 DIAGNOSIS — Z91040 Latex allergy status: Secondary | ICD-10-CM | POA: Diagnosis not present

## 2018-03-21 DIAGNOSIS — R197 Diarrhea, unspecified: Secondary | ICD-10-CM

## 2018-03-21 DIAGNOSIS — Z794 Long term (current) use of insulin: Secondary | ICD-10-CM | POA: Diagnosis not present

## 2018-03-21 DIAGNOSIS — J45909 Unspecified asthma, uncomplicated: Secondary | ICD-10-CM | POA: Diagnosis not present

## 2018-03-21 DIAGNOSIS — Z8542 Personal history of malignant neoplasm of other parts of uterus: Secondary | ICD-10-CM | POA: Insufficient documentation

## 2018-03-21 DIAGNOSIS — I10 Essential (primary) hypertension: Secondary | ICD-10-CM | POA: Diagnosis not present

## 2018-03-21 DIAGNOSIS — R109 Unspecified abdominal pain: Secondary | ICD-10-CM | POA: Diagnosis not present

## 2018-03-21 LAB — CBC WITH DIFFERENTIAL/PLATELET
Basophils Absolute: 0 10*3/uL (ref 0.0–0.1)
Basophils Relative: 1 %
Eosinophils Absolute: 0 10*3/uL (ref 0.0–0.7)
Eosinophils Relative: 1 %
HEMATOCRIT: 36.6 % (ref 36.0–46.0)
Hemoglobin: 12 g/dL (ref 12.0–15.0)
LYMPHS PCT: 35 %
Lymphs Abs: 1.6 10*3/uL (ref 0.7–4.0)
MCH: 29.8 pg (ref 26.0–34.0)
MCHC: 32.8 g/dL (ref 30.0–36.0)
MCV: 90.8 fL (ref 78.0–100.0)
MONO ABS: 0.2 10*3/uL (ref 0.1–1.0)
MONOS PCT: 5 %
NEUTROS ABS: 2.8 10*3/uL (ref 1.7–7.7)
Neutrophils Relative %: 58 %
Platelets: 204 10*3/uL (ref 150–400)
RBC: 4.03 MIL/uL (ref 3.87–5.11)
RDW: 13.2 % (ref 11.5–15.5)
WBC: 4.7 10*3/uL (ref 4.0–10.5)

## 2018-03-21 LAB — COMPREHENSIVE METABOLIC PANEL
ALT: 10 U/L (ref 0–44)
AST: 13 U/L — AB (ref 15–41)
Albumin: 4.1 g/dL (ref 3.5–5.0)
Alkaline Phosphatase: 69 U/L (ref 38–126)
Anion gap: 8 (ref 5–15)
BUN: 23 mg/dL — AB (ref 6–20)
CALCIUM: 9.5 mg/dL (ref 8.9–10.3)
CHLORIDE: 106 mmol/L (ref 98–111)
CO2: 26 mmol/L (ref 22–32)
Creatinine, Ser: 0.84 mg/dL (ref 0.44–1.00)
GFR calc Af Amer: >60 mL/min (ref >60)
Glucose, Bld: 192 mg/dL — ABNORMAL HIGH (ref 70–99)
Potassium: 3.6 mmol/L (ref 3.5–5.1)
Sodium: 140 mmol/L (ref 135–145)
Total Bilirubin: 0.6 mg/dL (ref 0.3–1.2)
Total Protein: 6.9 g/dL (ref 6.5–8.1)

## 2018-03-21 LAB — ETHANOL

## 2018-03-21 LAB — LIPASE, BLOOD: Lipase: 28 U/L (ref 11–51)

## 2018-03-21 MED ORDER — ONDANSETRON HCL 4 MG/2ML IJ SOLN
4.0000 mg | Freq: Once | INTRAMUSCULAR | Status: AC
  Administered 2018-03-21: 4 mg via INTRAVENOUS
  Filled 2018-03-21: qty 2

## 2018-03-21 MED ORDER — IOPAMIDOL (ISOVUE-300) INJECTION 61%
100.0000 mL | Freq: Once | INTRAVENOUS | Status: AC | PRN
  Administered 2018-03-21: 100 mL via INTRAVENOUS

## 2018-03-21 MED ORDER — ONDANSETRON 4 MG PO TBDP: 4.0000 mg | ORAL_TABLET | Freq: Three times a day (TID) | ORAL | 0 refills | Status: DC | PRN

## 2018-03-21 MED ORDER — SODIUM CHLORIDE 0.9 % IV BOLUS
1000.0000 mL | Freq: Once | INTRAVENOUS | Status: AC
  Administered 2018-03-21: 1000 mL via INTRAVENOUS

## 2018-03-21 NOTE — ED Provider Notes (Signed)
[facility] EMERGENCY DEPARTMENT Provider Note   CSN: [id number] Arrival date & time: 03/21/18  1209     History   Chief Complaint Chief Complaint  Patient presents with  . Abdominal Pain    HPI Kathy Hardy is a 62 y.o. female.  HPI  62 year old female, multiple medical problems including diabetes high blood pressure peripheral vascular disease.  She presents with a complaint of nausea vomiting and diarrhea, states this is been going on for couple of days in fact she states it started yesterday and she was seen at a clinic in [place] at which time she was diagnosed with a stomach infection and placed on medicines though she does not know the names of the medicines and states she could not pick them up until today.  Overnight she had persistent vomiting, multiple episodes of diarrhea which she states is nonbloody, diffuse abdominal pain.  She reports having a prior history of a hernia repair and states that she was pooping gauze this morning.  She was actually at her doctor's office getting ready to get routine blood work this morning when she had more episodes of vomiting, felt lightheaded and called for paramedic transport.  The paramedics found patient to be slightly hypotensive, slightly tachycardic, gave her IV fluids to which she improved.  Past Medical History:  Diagnosis Date  . Allergy   . Anxiety   . Arthritis   . Asthma   . DDD (degenerative disc disease)    lumbar  . Diabetes mellitus   . 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 GERD (gastroesophageal reflux disease)   . Glaucoma   . Hyperlipidemia   . Hypertension   . Insomnia   . Neuropathy   . Peripheral vascular disease ([place])   . 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 Shortness of breath    with exertion  . Sleep apnea    Stop Bang score of 4  . Stroke ([place])    TIAx 4,   . Substance abuse ([place])    cocaine  . Uterine cancer ([place]) 2000   unknown what type    Patient Active Problem List   Diagnosis  Date Noted  . Scoliosis 07/12/2016  . Primary osteoarthritis of one knee 05/17/2014  . Dysphagia, unspecified(787.20) 12/01/2013  . Substance abuse ([place]) 11/20/2013  . Non compliance w medication regimen 09/14/2013  . Proliferative diabetic retinopathy ([place]) 06/11/2012  . Diabetic neuropathy ([place]) 02/28/2012  . Chronic back pain 01/03/2012  . IBS (irritable bowel syndrome) 10/24/2011  . Allergic rhinitis 08/16/2011  . Chronic constipation 06/18/2011  . Gait disturbance 03/16/2011  . 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 Diabetes mellitus with complication ([place]) 83/41/9622  . Hyperlipidemia 03/15/2011  . Essential hypertension, benign 03/15/2011  . GAD (generalized anxiety disorder) 03/15/2011  . Chronic pain 03/15/2011    Past Surgical History:  Procedure Laterality Date  . CARPAL TUNNEL RELEASE Bilateral   . CHOLECYSTECTOMY     [facility]  . COLONOSCOPY  07/09/2011   [id number] hemorrhoids/Poor prep in the right colon, repeat in 2017  . ESOPHAGOGASTRODUODENOSCOPY  07/09/2011   RDE:YCXK gastritis  . HERNIA REPAIR    . PARS PLANA VITRECTOMY  07/01/2012   Procedure: PARS PLANA VITRECTOMY WITH 25 GAUGE;  Surgeon: Hayden Pedro, MD;  Location: [facility];  Service: Ophthalmology;  Laterality: Right;  with Laser treatment, Membrane peel, Gas Injection and Repair of Traction Retinal Detachment RIGHT eye  . RETINAL DETACHMENT SURGERY Right  07/01/2012  . Right leg surgery x 4 (polio)       OB History    Gravida  2   Para  2   Term  2   Preterm      AB      Living  2     SAB      TAB      Ectopic      Multiple      Live Births               Home Medications    Prior to Admission medications   Medication Sig Start Date End Date Taking? Authorizing Provider  albuterol (PROVENTIL HFA;VENTOLIN HFA) 108 (90 BASE) MCG/ACT inhaler Inhale 2 puffs into the lungs every 6 (six) hours as needed for wheezing or shortness of breath.   Yes [provider]  amLODipine (NORVASC) 5 MG tablet TAKE 1 TABLET BY MOUTH DAILY. 10/28/13  Yes [place], Modena Nunnery, MD  carvedilol (COREG) 3.125 MG tablet TAKE (1) TABLET TWICE DAILY. 01/27/18  Yes Herminio Commons, MD  clopidogrel (PLAVIX) 75 MG tablet TAKE 1 TABLET BY MOUTH EVERY MORNING - PT WANTS SUN PHARM. BRAND Patient taking differently: TAKE 1 TABLET BY MOUTH EVERY MORNING - 12/25/13  Yes [place], Modena Nunnery, MD  CRESTOR 20 MG tablet TAKE 1 TABLET BY MOUTH EVERY DAY 09/22/13  Yes [place], Modena Nunnery, MD  dicyclomine (BENTYL) 10 MG capsule TAKE 1 CAPSULE BY MOUTH EVERY 4 TO 6 HOURS AS NEEDED FOR DIARRHEA OR ABDOMINAL CRAMPS. 04/26/14  Yes [place], Modena Nunnery, MD  FLUoxetine (PROZAC) 40 MG capsule TAKE 1 CAPSULE BY MOUTH DAILY. (DOSE CHANGE) 02/22/14  Yes [place], Modena Nunnery, MD  gabapentin (NEURONTIN) 300 MG capsule TAKE 1 CAPSULE BY MOUTH AT BEDTIME Patient taking differently: TAKE ONE CAPSULE BY MOUTH THREE TIMES DAILY 10/28/13  Yes [place], Modena Nunnery, MD  hydrOXYzine (ATARAX/VISTARIL) 25 MG tablet Take 25 mg by mouth 3 (three) times daily as needed for anxiety or itching.   Yes [provider]  hyoscyamine (LEVSIN SL) 0.125 MG SL tablet Place 1 tablet (0.125 mg total) under the tongue every 4 (four) hours as needed. 12/01/13  Yes Annitta Needs, NP  Insulin Glargine (LANTUS SOLOSTAR) 100 UNIT/ML Solostar Pen Inject 30 Units into the skin daily at 10 pm. 02/22/14  Yes [place], Modena Nunnery, MD  levocetirizine (XYZAL) 5 MG tablet TAKE 1 TABLET IN THE EVENING. 04/21/14  Yes [place], Modena Nunnery, MD  lisinopril (PRINIVIL,ZESTRIL) 20 MG tablet Take 20 mg by mouth daily.   Yes [provider]  LORazepam (ATIVAN) 1 MG tablet Take 1 tablet (1 mg total) by mouth 3 (three) times daily as needed for anxiety. 09/22/14  Yes Daleen Bo, MD  meclizine (ANTIVERT) 25 MG tablet TAKE 1 TABLET BY MOUTH THREE TIMES DAILY AS NEEDED 04/27/13  Yes [place], Modena Nunnery, MD  metFORMIN (GLUCOPHAGE) 500 MG tablet Take 500 mg by mouth 2 (two)  times daily with a meal.   Yes [provider]  montelukast (SINGULAIR) 10 MG tablet Take 10 mg by mouth at bedtime.   Yes [provider]  pantoprazole (PROTONIX) 40 MG tablet TAKE 1 TABLET BY MOUTH EVERY DAY (STOP OMEPRAZOLE) 02/22/14  Yes [place], Modena Nunnery, MD  ondansetron (ZOFRAN ODT) 4 MG disintegrating tablet Take 1 tablet (4 mg total) by mouth every 8 (eight) hours as needed for nausea. 03/21/18   Noemi Chapel, MD    Family History Family History  Problem  Relation Age of Onset  . Diabetes Mother   . Hypertension Mother   . Arthritis Mother   . Diabetes Sister   . Arthritis Father   . Kidney disease Father        kidney failure  . Cancer Maternal Grandmother        type?  Marland Kitchen Hypertension Son   . Seizures Sister   . Pneumonia Brother   . Early death Brother 65       goiter  . Hypertension Son   . Anesthesia problems Neg Hx   . Hypotension Neg Hx   . Malignant hyperthermia Neg Hx   . Pseudochol deficiency Neg Hx   . Colon cancer Neg Hx     Social History Social History   Tobacco Use  . Smoking status: Former Research scientist (life sciences)  . Smokeless tobacco: Never Used  Substance Use Topics  . Alcohol use: Not Currently  . Drug use: No     Allergies   Latex and Aspirin   Review of Systems Review of Systems  All other systems reviewed and are negative.    Physical Exam Updated Vital Signs BP (!) 149/82   Pulse 84   Temp 97.9 F (36.6 C) (Oral)   Resp 16   Ht 5\' 2"  (1.575 m)   Wt 59.9 kg   SpO2 99%   BMI 24.14 kg/m   Physical Exam  Constitutional: She appears well-developed and well-nourished. No distress.  HENT:  Head: Normocephalic and atraumatic.  Mucous membranes slightly dehydrated  Eyes: Pupils are equal, round, and reactive to light. Conjunctivae and EOM are normal. Right eye exhibits no discharge. Left eye exhibits no discharge. No scleral icterus.  Neck: Normal range of motion. Neck supple. No JVD present. No thyromegaly present.    Cardiovascular: Normal rate, regular rhythm, normal heart sounds and intact distal pulses. Exam reveals no gallop and no friction rub.  No murmur heard. Pulmonary/Chest: Effort normal and breath sounds normal. No respiratory distress. She has no wheezes. She has no rales.  Abdominal: Soft. Bowel sounds are normal. She exhibits no distension and no mass. There is tenderness ( Mild diffuse abdominal tenderness without guarding or peritoneal signs).  Musculoskeletal: Normal range of motion. She exhibits no edema or tenderness.  Lymphadenopathy:    She has no cervical adenopathy.  Neurological: She is alert. Coordination normal.  Skin: Skin is warm and dry. No rash noted. No erythema.  Psychiatric: She has a normal mood and affect. Her behavior is normal.  Nursing note and vitals reviewed.    ED Treatments / Results  Labs (all labs ordered are listed, but only abnormal results are displayed) Labs Reviewed  COMPREHENSIVE METABOLIC PANEL - Abnormal; Notable for the following components:      Result Value   Glucose, Bld 192 (*)    BUN 23 (*)    AST 13 (*)    All other components within normal limits  ETHANOL  LIPASE, BLOOD  CBC WITH DIFFERENTIAL/PLATELET  URINALYSIS, ROUTINE W REFLEX MICROSCOPIC    EKG None  Radiology Ct Abdomen Pelvis W Contrast  Result Date: 03/21/2018 CLINICAL DATA:  Nausea/vomiting/diarrhea, abdominal pain, dizziness EXAM: CT ABDOMEN AND PELVIS WITH CONTRAST TECHNIQUE: Multidetector CT imaging of the abdomen and pelvis was performed using the standard protocol following bolus administration of intravenous contrast. CONTRAST:  152mL ISOVUE-300 IOPAMIDOL (ISOVUE-300) INJECTION 61% COMPARISON:  04/11/2017 FINDINGS: Lower chest: Lung bases are clear. Hepatobiliary: Liver is within normal limits. Status post cholecystectomy. No intrahepatic or extrahepatic ductal dilatation.  Pancreas: Within normal limits. Spleen: Within normal limits. Adrenals/Urinary Tract: Adrenal  glands are within normal limits. Kidneys are within normal limits.  No hydronephrosis. Bladder is mildly thick-walled although underdistended. Stomach/Bowel: Mild wall thickening of the gastric antrum (series 2/image 25). No evidence of bowel obstruction. Normal appendix (series 2/image 66). No colonic wall thickening or inflammatory changes. Vascular/Lymphatic: No evidence of abdominal aortic aneurysm. Atherosclerotic calcifications of the abdominal aorta and branch vessels. No suspicious abdominopelvic lymphadenopathy. Reproductive: Status post hysterectomy. No adnexal masses. Other: No abdominopelvic ascites. Musculoskeletal: Degenerative changes of the visualized thoracolumbar spine. IMPRESSION: Mild wall thickening of the gastric antrum, suggesting gastritis. No free air or surrounding inflammatory changes to suggest a perforated gastric ulcer. No evidence of bowel obstruction.  Normal appendix. Status post hysterectomy. Electronically Signed   By: Julian Hy M.D.   On: 03/21/2018 15:46    Procedures Procedures (including critical care time)  Medications Ordered in ED Medications  sodium chloride 0.9 % bolus 1,000 mL (0 mLs Intravenous Stopped 03/21/18 1352)  ondansetron (ZOFRAN) injection 4 mg (4 mg Intravenous Given 03/21/18 1252)  iopamidol (ISOVUE-300) 61 % injection 100 mL (100 mLs Intravenous Contrast Given 03/21/18 1529)     Initial Impression / Assessment and Plan / ED Course  I have reviewed the triage vital signs and the nursing notes.  Pertinent labs & imaging results that were available during my care of the patient were reviewed by me and considered in my medical decision making (see chart for details).  Clinical Course as of Mar 21 1554  Fri Mar 21, 2018  1550 Labs are totally normal, blood work is reassuring, vital signs are also normal except for slight hypertension which is of no consequence at this time.  CT scan shows no acute findings other than gastritis.  In  addition to antiemetic she will be prescribed ranitidine twice a day.  She was given instructions for return and expressed understanding, no further vomiting or unstable vital signs in the emergency department.   [BM]    Clinical Course User Index [BM] Noemi Chapel, MD   The patient has a nondistended but diffusely tender abdomen, multiple episodes of vomiting and diarrhea, likely has some type of gastroenteritis whether this is infectious, food poisoning etc.  She is continued to have symptoms overnight and does appear dehydrated and was slightly hypotensive prehospital.  We will proceed with some IV fluids, nausea medications, check some labs.  Exam is not consistent with acute appendicitis or cholecystitis or pancreatitis.    Final Clinical Impressions(s) / ED Diagnoses   Final diagnoses:  Acute gastritis without hemorrhage, unspecified gastritis type  Nausea vomiting and diarrhea    ED Discharge Orders         Ordered    ondansetron (ZOFRAN ODT) 4 MG disintegrating tablet  Every 8 hours PRN     03/21/18 1554           Noemi Chapel, MD 03/21/18 1555

## 2018-03-21 NOTE — Discharge Instructions (Signed)
Please start taking ranitidine, 150 mg twice a day, you can obtain this at your local pharmacy without prescription Please start taking Zofran, 4 mg by mouth as needed every 6 hours for nausea, this is a prescription medication Your CT scan showed gastritis which is inflammation of your stomach and should resolve over time, eat a low acid diet and take the medicines above to help heal your stomach. Please see Dr. Maudie Mercury in follow-up in the next 2 to 3 days however if you should develop severe or worsening symptoms return to the emergency department immediately. Drink plenty of clear liquids including Gatorade juices and water.

## 2018-03-21 NOTE — ED Notes (Signed)
Radiology at bedside to transport pt to CT.

## 2018-03-21 NOTE — ED Triage Notes (Signed)
Pt brought in by [facility] with c/o n/v/d and dizziness. Pt was seen yesterday by PCP and given prescriptions but pt reports she wasn't able to get them filled today due to feeling so bad. Pt went back to her PCP today for bloodwork and pt felt so bad that she called 911. CBG 224 by EMS.

## 2018-03-24 ENCOUNTER — Encounter: Payer: Self-pay | Admitting: Gastroenterology

## 2018-04-07 ENCOUNTER — Ambulatory Visit: Payer: Self-pay | Admitting: Cardiovascular Disease

## 2018-04-14 ENCOUNTER — Ambulatory Visit: Payer: Self-pay | Admitting: Cardiovascular Disease

## 2018-04-24 ENCOUNTER — Encounter: Payer: Self-pay | Admitting: Physician Assistant

## 2018-04-24 NOTE — Progress Notes (Deleted)
Cardiology Office Note    Date:  04/24/2018  ID:  Kathy Hardy, DOB Dec 02, 1955, MRN [id number] PCP:  Jani Gravel, MD  Cardiologist:  Kate Sable, MD  Chief Complaint: chest pain  History of Present Illness:  Kathy Hardy is a 62 y.o. female with history of chronic diastolic CHF, DM with macular edema and possible gastroparesis, anxiety, arthritis, asthma, DDD, RBBB, HTN, HLD, sleep apnea, recurrent TIAs, prior cocaine abuse, uterine cancer who returns for evaluation of chest pain.   Remote nuc 2012 was normal. PCP note outlines labs in 2015 where patient failed drug test (+ cocaine metabolites) and was advised she would no longer be given any pain medication. Per Dr. Court Joy note fron 11/2017, she had multiple somatic complaints at that visit. She was apparently hospitalized with CHF at [facility] in 08/2017. Per his report, echo demonstrated normal left ventricular systolic function, EF 55 to 60%, mild concentric LVH, grade 2 diastolic dysfunction with elevated left ventricular end-diastolic pressures, mild left atrial dilatation, and mild tricuspid regurgitation with moderate pulmonary hypertension. Her Cr rose to 1.34 during admission with dc value 1.11. Last labs 02/2018 K 3.6, Cr 0.84, LFTs OK, CBC wnl, lipase WNL, 2015 LDL 66.  Chest pain Chronic diastolic CHF HTN Hyperlipidemia    Past Medical History:  Diagnosis Date  . Allergy   . Anxiety   . Arthritis   . Asthma   . Chronic diastolic CHF (congestive heart failure) ([place])   . DDD (degenerative disc disease)    lumbar  . Diabetes mellitus   . 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 GERD (gastroesophageal reflux disease)   . Glaucoma   . Hyperlipidemia   . Hypertension   . Insomnia   . Neuropathy   . Peripheral vascular disease ([place])   . 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 RBBB   . Sleep apnea    Stop Bang score of 4  . Stroke ([place])    TIAx 4,   . Substance abuse ([place])    cocaine  . Uterine cancer ([place])  2000   unknown what type    Past Surgical History:  Procedure Laterality Date  . CARPAL TUNNEL RELEASE Bilateral   . CHOLECYSTECTOMY     [place]  . COLONOSCOPY  07/09/2011   SFK:CLEXNTZG hemorrhoids/Poor prep in the right colon, repeat in 2017  . ESOPHAGOGASTRODUODENOSCOPY  07/09/2011   YFV:CBSW gastritis  . HERNIA REPAIR    . PARS PLANA VITRECTOMY  07/01/2012   Procedure: PARS PLANA VITRECTOMY WITH 25 GAUGE;  Surgeon: Hayden Pedro, MD;  Location: [place];  Service: Ophthalmology;  Laterality: Right;  with Laser treatment, Membrane peel, Gas Injection and Repair of Traction Retinal Detachment RIGHT eye  . RETINAL DETACHMENT SURGERY Right 07/01/2012  . Right leg surgery x 4 (polio)      Current Medications: No outpatient medications have been marked as taking for the 04/25/18 encounter (Appointment) with Charlie Pitter, PA-C.   Current Facility-Administered Medications for the 04/25/18 encounter (Appointment) with Charlie Pitter, PA-C  Medication  . Influenza (>/= 3 years) inactive virus vaccine (FLVIRIN/FLUZONE) injection SUSP 0.5 mL   ***   Allergies:   Latex and Aspirin   Social History   Socioeconomic History  . Marital status: Legally Separated    Spouse name: Not on file  . Number of children: 2  . Years of education: 25  . Highest education level: Not on file  Occupational History  . Occupation:  disabled    Comment: "sugar", orthopedic  Social Needs  . Financial resource strain: Not on file  . Food insecurity:    Worry: Not on file    Inability: Not on file  . Transportation needs:    Medical: Not on file    Non-medical: Not on file  Tobacco Use  . Smoking status: Former Research scientist (life sciences)  . Smokeless tobacco: Never Used  Substance and Sexual Activity  . Alcohol use: Not Currently  . Drug use: No  . Sexual activity: Not Currently    Birth control/protection: None  Lifestyle  . Physical activity:    Days per week: Not on file    Minutes per session: Not on file  .  Stress: Not on file  Relationships  . Social connections:    Talks on phone: Not on file    Gets together: Not on file    Attends religious service: Not on file    Active member of club or organization: Not on file    Attends meetings of clubs or organizations: Not on file    Relationship status: Not on file  Other Topics Concern  . Not on file  Social History Narrative   Lives at home with boyfriend   Disabled from back/leg/diabetes     Family History:  The patient's ***family history includes Arthritis in her father and mother; Cancer in her maternal grandmother; Diabetes in her mother and sister; Early death (age of onset: 65) in her brother; Hypertension in her mother, son, and son; Kidney disease in her father; Pneumonia in her brother; Seizures in her sister. There is no history of Anesthesia problems, Hypotension, Malignant hyperthermia, Pseudochol deficiency, or Colon cancer.  ROS:   Please see the history of present illness. Otherwise, review of systems is positive for ***.  All other systems are reviewed and otherwise negative.    PHYSICAL EXAM:   VS:  There were no vitals taken for this visit.  BMI: There is no height or weight on file to calculate BMI. GEN: Well nourished, well developed, in no acute distress HEENT: normocephalic, atraumatic Neck: no JVD, carotid bruits, or masses Cardiac: ***RRR; no murmurs, rubs, or gallops, no edema  Respiratory:  clear to auscultation bilaterally, normal work of breathing GI: soft, nontender, nondistended, + BS MS: no deformity or atrophy Skin: warm and dry, no rash Neuro:  Alert and Oriented x 3, Strength and sensation are intact, follows commands Psych: euthymic mood, full affect  Wt Readings from Last 3 Encounters:  03/21/18 132 lb (59.9 kg)  12/19/17 137 lb (62.1 kg)  12/06/17 141 lb (64 kg)      Studies/Labs Reviewed:   EKG:  EKG was ordered today and personally reviewed by me and demonstrates *** EKG was not  ordered today.***  Recent Labs: 03/21/2018: ALT 10; BUN 23; Creatinine, Ser 0.84; Hemoglobin 12.0; Platelets 204; Potassium 3.6; Sodium 140   Lipid Panel    Component Value Date/Time   CHOL 138 09/14/2013 1018   TRIG 79 09/14/2013 1018   HDL 56 09/14/2013 1018   CHOLHDL 2.5 09/14/2013 1018   VLDL 16 09/14/2013 1018   LDLCALC 66 09/14/2013 1018   LDLDIRECT 122 (H) 10/23/2012 1125    Additional studies/ records that were reviewed today include: Summarized above.***    ASSESSMENT & PLAN:   1. ***  Disposition: F/u with ***   Medication Adjustments/Labs and Tests Ordered: Current medicines are reviewed at length with the patient today.  Concerns regarding medicines are outlined  above. Medication changes, Labs and Tests ordered today are summarized above and listed in the Patient Instructions accessible in Encounters.   Signed, Charlie Pitter, PA-C  04/24/2018 5:17 PM    [facility] Location in [place], [place] [address] Ph: [phone]; Fax [phone]

## 2018-04-25 ENCOUNTER — Ambulatory Visit: Payer: Self-pay | Admitting: Physician Assistant

## 2018-05-28 ENCOUNTER — Ambulatory Visit: Payer: Medicaid Other | Admitting: Gastroenterology

## 2018-06-16 ENCOUNTER — Encounter: Payer: Self-pay | Admitting: Neurology

## 2018-06-16 ENCOUNTER — Telehealth: Payer: Self-pay

## 2018-06-16 ENCOUNTER — Ambulatory Visit: Payer: Medicaid Other | Admitting: Neurology

## 2018-06-16 VITALS — BP 177/85 | HR 81 | Ht 62.0 in | Wt 144.6 lb

## 2018-06-16 DIAGNOSIS — B91 Sequelae of poliomyelitis: Secondary | ICD-10-CM

## 2018-06-16 DIAGNOSIS — E0842 Diabetes mellitus due to underlying condition with diabetic polyneuropathy: Secondary | ICD-10-CM

## 2018-06-16 DIAGNOSIS — M89661 Osteopathy after poliomyelitis, right lower leg: Secondary | ICD-10-CM | POA: Diagnosis not present

## 2018-06-16 DIAGNOSIS — I6990 Unspecified sequelae of unspecified cerebrovascular disease: Secondary | ICD-10-CM | POA: Diagnosis not present

## 2018-06-16 MED ORDER — GABAPENTIN 300 MG PO CAPS: 300.0000 mg | ORAL_CAPSULE | Freq: Two times a day (BID) | ORAL | 6 refills | Status: DC

## 2018-06-16 NOTE — Telephone Encounter (Signed)
ok 

## 2018-06-16 NOTE — Progress Notes (Signed)
[facility] [address] [place]. [place] [address] [phone]       OFFICE CONSULT NOTE  [name] Date of Birth:  12-Jun-1956 Medical Record Number:  [id number]   Referring MD: Lars Mage, FNP  Reason for Referral:  Stroke f/u HPI: Kathy Hardy is a 62 year old pleasant African-American lady who is seen today for initial office consultation visit.  She is accompanied by her health aide.  She was referred to me for stroke follow-up.  Patient is unable to give me specific details about her stroke but apparently she had it about 10 years ago.  She was admitted to [facility] in [place].  I do not have the hospital records to review today.  She apparently followed up with neurologist Dr. Merlene Laughter and I do not have his records either.  The patient apparently did not like him and hence did not go for follow-up for more than a couple of visits.  She states that she developed sudden onset of left-sided weakness and numbness.  She was in the hospital for about a week or so and subsequently got some rehab and was discharged home and had some outpatient therapy.  She states she was able to walk and was started on Plavix for stroke prevention.  Her multiple vascular risk factors include diabetes, hypertension, hyperlipidemia and CHF.  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  Over the last several years she is undergone orthopedic correction surgeries for her right foot without significant benefit.  Her gait and balance have worsened and she was using a cane and more recently has been using a wheeled walker.  She also had some issues with chronic dysphagia and underwent esophageal stretching which also did not go well.  She states she has had no recurrent stroke or TIA symptoms.  She is on Plavix but states that she does not like the medicine as it gives her some chest pain.  She apparently is allergic to aspirin and her chart documents  history of hives with it.  She has not had any recent vascular imaging studies or any follow-up lipid profile checked.  Review of electronic medical records show last hemoglobin A1c was 12.8 in 2015 and LDL cholesterol was 66 mg percent.  Review of her imaging studies in PACS shows CT scan of the head which was normal on 09/08/2017 following unresponsive episode from hypoglycemia. and another CT scan on 07/05/2016 was also normal.  She does complain of tingling numbness and burning in her feet and hands from diabetic neuropathy which is also chronic.  She takes gabapentin 300 mg at night which apparently helps but she has never tried a daytime dose yet.  ROS:   14 system review of systems is positive for gait difficulty, leg weakness, numbness, balance difficulty and all other systems negative  PMH:  Past Medical History:  Diagnosis Date  . Allergy   . Anxiety   . Anxiety   . Arthritis   . Asthma   . Chronic diastolic CHF (congestive heart failure) ([place])   . DDD (degenerative disc disease)    lumbar  . Diabetes mellitus   . 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 Diabetic neuropathy ([place])   . GERD (gastroesophageal reflux disease)   . Glaucoma   . Hyperlipidemia   . Hypertension   . Insomnia   . Neuropathy   . Peripheral vascular disease ([place])   . Polio  born with this  . RBBB   . Sleep apnea    Stop Bang score of 4  . Stress incontinence   . Stroke ([place])    TIAx 4,   . Substance abuse ([place])    cocaine  . Uterine cancer ([place]) 2000   unknown what type    Social History:  Social History   Socioeconomic History  . Marital status: Legally Separated    Spouse name: Not on file  . Number of children: 2  . Years of education: 96  . Highest education level: Not on file  Occupational History  . Occupation: disabled    Comment: "sugar", orthopedic  Social Needs  . Financial resource strain: Not on file  . Food insecurity:    Worry: Not on file    Inability: Not  on file  . Transportation needs:    Medical: Not on file    Non-medical: Not on file  Tobacco Use  . Smoking status: Former Research scientist (life sciences)  . Smokeless tobacco: Never Used  Substance and Sexual Activity  . Alcohol use: Not Currently  . Drug use: No  . Sexual activity: Not Currently    Birth control/protection: None  Lifestyle  . Physical activity:    Days per week: Not on file    Minutes per session: Not on file  . Stress: Not on file  Relationships  . Social connections:    Talks on phone: Not on file    Gets together: Not on file    Attends religious service: Not on file    Active member of club or organization: Not on file    Attends meetings of clubs or organizations: Not on file    Relationship status: Not on file  . Intimate partner violence:    Fear of current or ex partner: Not on file    Emotionally abused: Not on file    Physically abused: Not on file    Forced sexual activity: Not on file  Other Topics Concern  . Not on file  Social History Narrative   Lives at home with boyfriend   Disabled from back/leg/diabetes    Medications:   Current Outpatient Medications on File Prior to Visit  Medication Sig Dispense Refill  . albuterol (PROVENTIL HFA;VENTOLIN HFA) 108 (90 BASE) MCG/ACT inhaler Inhale 2 puffs into the lungs every 6 (six) hours as needed for wheezing or shortness of breath.    Marland Kitchen amLODipine (NORVASC) 5 MG tablet TAKE 1 TABLET BY MOUTH DAILY. 30 tablet 6  . budesonide-formoterol (SYMBICORT) 160-4.5 MCG/ACT inhaler Inhale 2 puffs into the lungs 2 (two) times daily.    . carvedilol (COREG) 3.125 MG tablet TAKE (1) TABLET TWICE DAILY. 60 tablet 1  . clopidogrel (PLAVIX) 75 MG tablet TAKE 1 TABLET BY MOUTH EVERY MORNING - PT WANTS SUN PHARM. BRAND (Patient taking differently: TAKE 1 TABLET BY MOUTH EVERY MORNING -) 30 tablet 3  . CRESTOR 20 MG tablet TAKE 1 TABLET BY MOUTH EVERY DAY 30 tablet 6  . dicyclomine (BENTYL) 10 MG capsule TAKE 1 CAPSULE BY MOUTH EVERY 4 TO  6 HOURS AS NEEDED FOR DIARRHEA OR ABDOMINAL CRAMPS. 120 capsule 0  . FLUoxetine (PROZAC) 40 MG capsule TAKE 1 CAPSULE BY MOUTH DAILY. (DOSE CHANGE) 30 capsule 3  . furosemide (LASIX) 20 MG tablet Take by mouth.    Marland Kitchen glucose blood test strip Check blood sugar 4 times daily    . hydrOXYzine (ATARAX/VISTARIL) 25 MG tablet Take 25 mg by mouth 3 (  three) times daily as needed for anxiety or itching.    . hyoscyamine (LEVSIN SL) 0.125 MG SL tablet Place 1 tablet (0.125 mg total) under the tongue every 4 (four) hours as needed. 120 tablet 3  . Insulin Glargine (LANTUS SOLOSTAR) 100 UNIT/ML Solostar Pen Inject 30 Units into the skin daily at 10 pm. 10 pen 3  . Insulin Glargine (LANTUS) 100 UNIT/ML Solostar Pen Inject into the skin.    Marland Kitchen levocetirizine (XYZAL) 5 MG tablet TAKE 1 TABLET IN THE EVENING. 30 tablet 0  . lisinopril (PRINIVIL,ZESTRIL) 20 MG tablet Take 20 mg by mouth daily.    Marland Kitchen LORazepam (ATIVAN) 1 MG tablet Take 1 tablet (1 mg total) by mouth 3 (three) times daily as needed for anxiety. 20 tablet 0  . meclizine (ANTIVERT) 25 MG tablet TAKE 1 TABLET BY MOUTH THREE TIMES DAILY AS NEEDED 30 tablet 0  . metFORMIN (GLUCOPHAGE) 500 MG tablet Take 500 mg by mouth 2 (two) times daily with a meal.    . Misc. Devices MISC Life line    . Misc. Devices MISC depends    . Misc. Devices MISC Compression hose  15 mm thigh high    . montelukast (SINGULAIR) 10 MG tablet Take 10 mg by mouth at bedtime.    . ondansetron (ZOFRAN ODT) 4 MG disintegrating tablet Take 1 tablet (4 mg total) by mouth every 8 (eight) hours as needed for nausea. 10 tablet 0  . pantoprazole (PROTONIX) 40 MG tablet TAKE 1 TABLET BY MOUTH EVERY DAY (STOP OMEPRAZOLE) 30 tablet 3  . rosuvastatin (CRESTOR) 20 MG tablet Take by mouth.     Current Facility-Administered Medications on File Prior to Visit  Medication Dose Route Frequency Provider Last Rate Last Dose  . Influenza (>/= 3 years) inactive virus vaccine (FLVIRIN/FLUZONE) injection  SUSP 0.5 mL  0.5 mL Intramuscular Once Fayrene Helper, MD        Allergies:   Allergies  Allergen Reactions  . Oxycodone Nausea Only and Itching    Nausea one time  . Latex Hives    Hives only  . Aspirin     Swelling, hives     Physical Exam General: frail middle aged african american lady, seated, in no evident distress Head: head normocephalic and atraumatic.   Neck: supple with no carotid or supraclavicular bruits Cardiovascular: regular rate and rhythm, no murmurs Musculoskeletal: no deformity Skin:  no rash/petichiae. Trace pedal edema bilaterally Vascular:  Normal pulses all extremities  Neurologic Exam Mental Status: Awake and fully alert. Oriented to place and time. Recent and remote memory intact. Attention span, concentration and fund of knowledge appropriate. Mood and affect appropriate.  Cranial Nerves: Fundoscopic exam reveals sharp disc margins. Pupils equal, briskly reactive to light. Extraocular movements full without nystagmus. Visual fields full to confrontation. Hearing intact. Facial sensation intact. Face, tongue, palate moves normally and symmetrically.  Motor:wasting of right leg muscles with decrease in size and length. Mild left grip and intrinsic hand muscle weakness. Mild bilateral hip flexor weakness. Right foot drop with 0/5 ankle weakness on right and mild left ankle dorsiflexor weakness 4/5.  Sensory.: diminished touch , pinprick , position and vibratory sensation in both ankles down.  Coordination: Rapid alternating movements normal in all extremities. Finger-to-nose and heel-to-shin performed accurately bilaterally. Gait and Station: Arises from chair with  difficulty. Stance is wide based. Gait demonstrates mild ataxia and uses a wheled walker . Not able  to heel, toe and tandem walk without difficulty.  Reflexes: 1+ and symmetric except right ankle jerk is absent and left id depressed.. Toes downgoing.   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     PLAN: I had a long discussion with the patient and her health aide regarding her remote stroke which I suspect was a right brain subcortical lacunar infarct as well as her right leg weakness from childhood polio as well as lower extremity paresthesias from chronic diabetic sensory neuropathy and answered questions.  Continue Plavix  for stroke prevention as she is she is allergic to aspirin and has had hives in the past with it.  Maintain strict control of her risk factors with blood pressure goal below 130/90, diabetes with hemoglobin A1c goal below 6.5% and lipids with LDL cholesterol goal below 70 mg percent.  I advised her not to smoke.  We also discussed increasing gabapentin to 300 mg twice daily to help with her paresthesias.  Refer to physical therapy for right foot brace and gait and balance training.  Check hemoglobin A1c, lipid profile and urine drug screen as well as carotid ultrasound.  Greater than 50% time during this 45-minute consultation visit was spent on counseling and coordination of care about her remote stroke, diabetic neuropathy and answering questions return for follow-up in 3 months with my nurse practitioner Janett Billow or call earlier if necessary. Antony Contras, MD  [facility] [address] [place], [place] [address]  Phone [phone] Fax [phone] Note: This document was prepared with digital dictation and possible smart phrase technology. Any transcriptional errors that result from this process are unintentional.

## 2018-06-16 NOTE — Telephone Encounter (Signed)
Rn receive a message from [place] in our lab about pt stating she was unable to give urine sample.Message will be sent to [place].

## 2018-06-16 NOTE — Patient Instructions (Addendum)
I had a long discussion with the patient and Kathy Hardy health aide regarding Kathy Hardy remote stroke which I suspect was a right brain subcortical lacunar infarct as well as Kathy Hardy right leg weakness from childhood polio as well as lower extremity paresthesias from chronic diabetic sensory neuropathy and answered questions.  Continue Plavix  for stroke prevention as she is she is allergic to aspirin and has had hives in the past with it.  Maintain strict control of Kathy Hardy risk factors with blood pressure goal below 130/90, diabetes with hemoglobin A1c goal below 6.5% and lipids with LDL cholesterol goal below 70 mg percent.  I advised Kathy Hardy not to smoke.  We also discussed increasing gabapentin to 300 mg twice daily to help with Kathy Hardy paresthesias.  Refer to physical therapy for right foot brace and gait and balance training.  Check hemoglobin A1c, lipid profile and urine drug screen as well as carotid ultrasound.  Return for follow-up in 3 months with my nurse practitioner Janett Billow or call earlier if necessary.  Stroke Prevention Some medical conditions and behaviors are associated with a higher chance of having a stroke. You can help prevent a stroke by making nutrition, lifestyle, and other changes, including managing any medical conditions you may have. What nutrition changes can be made?  Eat healthy foods. You can do this by: ? Choosing foods high in fiber, such as fresh fruits and vegetables and whole grains. ? Eating at least 5 or more servings of fruits and vegetables a day. Try to fill half of your plate at each meal with fruits and vegetables. ? Choosing lean protein foods, such as lean cuts of meat, poultry without skin, fish, tofu, beans, and nuts. ? Eating low-fat dairy products. ? Avoiding foods that are high in salt (sodium). This can help lower blood pressure. ? Avoiding foods that have saturated fat, trans fat, and cholesterol. This can help prevent high cholesterol. ? Avoiding processed and premade  foods.  Follow your health care provider's specific guidelines for losing weight, controlling high blood pressure (hypertension), lowering high cholesterol, and managing diabetes. These may include: ? Reducing your daily calorie intake. ? Limiting your daily sodium intake to 1,500 milligrams (mg). ? Using only healthy fats for cooking, such as olive oil, canola oil, or sunflower oil. ? Counting your daily carbohydrate intake. What lifestyle changes can be made?  Maintain a healthy weight. Talk to your health care provider about your ideal weight.  Get at least 30 minutes of moderate physical activity at least 5 days a week. Moderate activity includes brisk walking, biking, and swimming.  Do not use any products that contain nicotine or tobacco, such as cigarettes and e-cigarettes. If you need help quitting, ask your health care provider. It may also be helpful to avoid exposure to secondhand smoke.  Limit alcohol intake to no more than 1 drink a day for nonpregnant women and 2 drinks a day for men. One drink equals 12 oz of beer, 5 oz of wine, or 1 oz of hard liquor.  Stop any illegal drug use.  Avoid taking birth control pills. Talk to your health care provider about the risks of taking birth control pills if: ? You are over 35 years old. ? You smoke. ? You get migraines. ? You have ever had a blood clot. What other changes can be made?  Manage your cholesterol levels. ? Eating a healthy diet is important for preventing high cholesterol. If cholesterol cannot be managed through diet alone, you may also  need to take medicines. ? Take any prescribed medicines to control your cholesterol as told by your health care provider.  Manage your diabetes. ? Eating a healthy diet and exercising regularly are important parts of managing your blood sugar. If your blood sugar cannot be managed through diet and exercise, you may need to take medicines. ? Take any prescribed medicines to control  your diabetes as told by your health care provider.  Control your hypertension. ? To reduce your risk of stroke, try to keep your blood pressure below 130/80. ? Eating a healthy diet and exercising regularly are an important part of controlling your blood pressure. If your blood pressure cannot be managed through diet and exercise, you may need to take medicines. ? Take any prescribed medicines to control hypertension as told by your health care provider. ? Ask your health care provider if you should monitor your blood pressure at home. ? Have your blood pressure checked every year, even if your blood pressure is normal. Blood pressure increases with age and some medical conditions.  Get evaluated for sleep disorders (sleep apnea). Talk to your health care provider about getting a sleep evaluation if you snore a lot or have excessive sleepiness.  Take over-the-counter and prescription medicines only as told by your health care provider. Aspirin or blood thinners (antiplatelets or anticoagulants) may be recommended to reduce your risk of forming blood clots that can lead to stroke.  Make sure that any other medical conditions you have, such as atrial fibrillation or atherosclerosis, are managed. What are the warning signs of a stroke? The warning signs of a stroke can be easily remembered as BEFAST.  B is for balance. Signs include: ? Dizziness. ? Loss of balance or coordination. ? Sudden trouble walking.  E is for eyes. Signs include: ? A sudden change in vision. ? Trouble seeing.  F is for face. Signs include: ? Sudden weakness or numbness of the face. ? The face or eyelid drooping to one side.  A is for arms. Signs include: ? Sudden weakness or numbness of the arm, usually on one side of the body.  S is for speech. Signs include: ? Trouble speaking (aphasia). ? Trouble understanding.  T is for time. ? These symptoms may represent a serious problem that is an emergency. Do not  wait to see if the symptoms will go away. Get medical help right away. Call your local emergency services (911 in the [place] Do not drive yourself to the hospital.  Other signs of stroke may include: ? A sudden, severe headache with no known cause. ? Nausea or vomiting. ? Seizure.  Where to find more information: For more information, visit:  American Stroke Association: www.strokeassociation.org  National Stroke Association: www.stroke.org  Summary  You can prevent a stroke by eating healthy, exercising, not smoking, limiting alcohol intake, and managing any medical conditions you may have.  Do not use any products that contain nicotine or tobacco, such as cigarettes and e-cigarettes. If you need help quitting, ask your health care provider. It may also be helpful to avoid exposure to secondhand smoke.  Remember BEFAST for warning signs of stroke. Get help right away if you or a loved one has any of these signs. This information is not intended to replace advice given to you by your health care provider. Make sure you discuss any questions you have with your health care provider. Document Released: 08/23/2004 Document Revised: 08/21/2016 Document Reviewed: 08/21/2016 Elsevier Interactive Patient Education  2018  [employer].

## 2018-06-17 LAB — LIPID PANEL
CHOLESTEROL TOTAL: 164 mg/dL (ref 100–199)
Chol/HDL Ratio: 2.4 ratio (ref 0.0–4.4)
HDL: 69 mg/dL (ref >39)
LDL CALC: 82 mg/dL (ref 0–99)
TRIGLYCERIDES: 63 mg/dL (ref 0–149)
VLDL Cholesterol Cal: 13 mg/dL (ref 5–40)

## 2018-06-17 LAB — HEMOGLOBIN A1C
Est. average glucose Bld gHb Est-mCnc: 266 mg/dL
Hgb A1c MFr Bld: 10.9 % — ABNORMAL HIGH (ref 4.8–5.6)

## 2018-06-18 ENCOUNTER — Telehealth: Payer: Self-pay

## 2018-06-18 NOTE — Telephone Encounter (Signed)
Rn call patient about her lab work. Rn stated her bad cholesterol and tests for diabetes is not satisfactory.RN stated she needs to seek her primary doctor for medication adjustment. PT verbalized understanding. Rn stated a message will be forward to her PCP. ------

## 2018-06-18 NOTE — Telephone Encounter (Signed)
-----   Message from Garvin Fila, MD sent at 06/17/2018  1:43 PM EST ----- Kindly inform the patient that both blood bad cholesterol and tests for diabetes on not satisfactory and to see primary care physician for medication adjustment.

## 2018-06-20 ENCOUNTER — Telehealth: Payer: Self-pay | Admitting: Neurology

## 2018-06-20 NOTE — Telephone Encounter (Signed)
[facility] called stating that the patient needs authorization for carotid. I called [employer] to start the authorization request.

## 2018-06-20 NOTE — Telephone Encounter (Signed)
I called to make the patient aware of the cancellation.

## 2018-06-20 NOTE — Telephone Encounter (Signed)
[employer] representative Darla G. Stated that the patient did not have an active account set up with [employer], therefor they would have to create one and this would take at least 24 hours. Then a case can be submitted for PA. The auth can be submitted throught the portal on Monday 06/23/18. I called and made [facility] aware.

## 2018-06-23 ENCOUNTER — Ambulatory Visit (HOSPITAL_COMMUNITY): Payer: Medicaid Other

## 2018-06-23 ENCOUNTER — Encounter (HOSPITAL_COMMUNITY): Payer: Self-pay

## 2018-06-23 NOTE — Telephone Encounter (Signed)
I Have spoke to patient she is approved for her doppler . Apt 07/03/2018 Josem Kaufmann  JOIN#[id number]

## 2018-06-25 ENCOUNTER — Encounter (HOSPITAL_COMMUNITY): Payer: Self-pay

## 2018-07-03 ENCOUNTER — Ambulatory Visit (HOSPITAL_COMMUNITY): Admission: RE | Admit: 2018-07-03 | Payer: Medicaid Other | Source: Ambulatory Visit

## 2018-07-09 ENCOUNTER — Ambulatory Visit (HOSPITAL_COMMUNITY): Payer: Medicaid Other

## 2018-08-13 ENCOUNTER — Other Ambulatory Visit: Payer: Self-pay | Admitting: *Deleted

## 2018-08-13 ENCOUNTER — Encounter

## 2018-08-13 ENCOUNTER — Other Ambulatory Visit: Payer: Self-pay

## 2018-08-13 ENCOUNTER — Ambulatory Visit (INDEPENDENT_AMBULATORY_CARE_PROVIDER_SITE_OTHER): Payer: Medicaid Other | Admitting: Gastroenterology

## 2018-08-13 ENCOUNTER — Encounter: Payer: Self-pay | Admitting: Gastroenterology

## 2018-08-13 ENCOUNTER — Telehealth: Payer: Self-pay | Admitting: *Deleted

## 2018-08-13 ENCOUNTER — Encounter: Payer: Self-pay | Admitting: *Deleted

## 2018-08-13 DIAGNOSIS — R1310 Dysphagia, unspecified: Secondary | ICD-10-CM | POA: Diagnosis not present

## 2018-08-13 DIAGNOSIS — Z1211 Encounter for screening for malignant neoplasm of colon: Secondary | ICD-10-CM | POA: Diagnosis not present

## 2018-08-13 DIAGNOSIS — K581 Irritable bowel syndrome with constipation: Secondary | ICD-10-CM

## 2018-08-13 DIAGNOSIS — Z1159 Encounter for screening for other viral diseases: Secondary | ICD-10-CM | POA: Diagnosis not present

## 2018-08-13 MED ORDER — PANTOPRAZOLE SODIUM 40 MG PO TBEC: 40.0000 mg | DELAYED_RELEASE_TABLET | Freq: Two times a day (BID) | ORAL | 11 refills | Status: DC

## 2018-08-13 NOTE — Assessment & Plan Note (Signed)
SYMPTOMS FAIRLY WELL CONTROLLED AFTER GB REMOVED.  CONTINUE TO MONITOR SYMPTOMS. FOLLOW UP IN 4 MOS.

## 2018-08-13 NOTE — Assessment & Plan Note (Signed)
NO DOCUMENTED HEP C SCREENING IN PT WITH HISTORY OF SUBSTANCE ABUSE.  HEP C Ab WITH [facility].

## 2018-08-13 NOTE — Patient Instructions (Addendum)
TO REDUCE PROBLEMS SWALLOWING:    1. DRINK WATER TO KEEP YOUR URINE LIGHT YELLOW.   2. FOLLOW A SOFT MECHANICAL DIET.  MEATS SHOULD BE GROUND ONLY. DO NOT EAT CHUNKS OF ANYTHING. SEE INFO BELOW.   3. AVOID REFLUX TRIGGERS. SEE INFO BELOW.   4. RE-START PROTONIX. TAKE 30 MINUTES PRIOR TO MEALS TWICE DAILY.    COMPLETE UPPER ENDOSCOPY TO STRETCH YOUR ESOPHAGUS.  YOU SHOULD BE SCREENED FOR HEPATITIS C. WE WILL ADD THE TEST TO YOUR PREOP LABS.  FOLLOW UP IN 6 MOS.   Lifestyle and home remedies TO CONTROL HEARTBURN You may eliminate or reduce the frequency of heartburn by making the following lifestyle changes:  . Control your weight. Being overweight is a major risk factor for heartburn and GERD. Excess pounds put pressure on your abdomen, pushing up your stomach and causing acid to back up into your esophagus.   . Eat smaller meals. 4 TO 6 MEALS A DAY. This reduces pressure on the lower esophageal sphincter, helping to prevent the valve from opening and acid from washing back into your esophagus.   Dolphus Jenny your belt. Clothes that fit tightly around your waist put pressure on your abdomen and the lower esophageal sphincter.   . Eliminate heartburn triggers. Everyone has specific triggers. Common triggers such as fatty or fried foods, spicy food, tomato sauce, carbonated beverages, alcohol, chocolate, mint, garlic, onion, caffeine and nicotine may make heartburn worse.   Marland Kitchen Avoid stooping or bending. Tying your shoes is OK. Bending over for longer periods to weed your garden isn't, especially soon after eating.   . Don't lie down after a meal. Wait at least three to four hours after eating before going to bed, and don't lie down right after eating.   Alternative medicine . Several home remedies exist for treating GERD, but they provide only temporary relief. They include drinking baking soda (sodium bicarbonate) added to water or drinking other fluids such as baking soda mixed with cream of  tartar and water. . Although these liquids create temporary relief by neutralizing, washing away or buffering acids, eventually they aggravate the situation by adding gas and fluid to your stomach, increasing pressure and causing more acid reflux. Further, adding more sodium to your diet may increase your blood pressure and add stress to your heart, and excessive bicarbonate ingestion can alter the acid-base balance in your body.  SOFT MECHANICAL DIET This SOFT MECHANICAL DIET is restricted to:  Foods that are moist, soft-textured, and easy to chew and swallow.   Meats that are ground or are minced no larger than one-quarter inch pieces. Meats are moist with gravy or sauce added.   Foods that do not include bread or bread-like textures except soft pancakes, well-moistened with syrup or sauce.   Textures with some chewing ability required.   Casseroles without rice.   Cooked vegetables that are less than half an inch in size and easily mashed with a fork. No cooked corn, peas, broccoli, cauliflower, cabbage, Brussels sprouts, asparagus, or other fibrous, non-tender or rubbery cooked vegetables.   Canned fruit except for pineapple. Fruit must be cut into pieces no larger than half an inch in size.   Foods that do not include nuts, seeds, coconut, or sticky textures.   FOOD TEXTURES FOR DYSPHAGIA DIET LEVEL 2 -SOFT MECHANICAL DIET (includes all foods on Dysphagia Diet Level 1 - Pureed, in addition to the foods listed below)  FOOD GROUP: Breads. RECOMMENDED: Soft pancakes, well-moistened with syrup or  sauce.  AVOID: All others.  FOOD GROUP: Cereals.  RECOMMENDED: Cooked cereals with little texture, including oatmeal. Unprocessed wheat bran stirred into cereals for bulk. Note: If thin liquids are restricted, it is important that all of the liquid is absorbed into the cereal.  AVOID: All dry cereals and any cooked cereals that may contain flax seeds or other seeds or nuts. Whole-grain,  dry, or coarse cereals. Cereals with nuts, seeds, dried fruit, and/or coconut.  FOOD GROUP: Desserts. RECOMMENDED: Pudding, custard. Soft fruit pies with bottom crust only. Canned fruit (excluding pineapple). Soft, moist cakes with icing.Frozen malts, milk shakes, frozen yogurt, eggnog, nutritional supplements, ice cream, sherbet, regular or sugar-free gelatin, or any foods that become thin liquid at either room (70 F) or body temperature (98 F).  AVOID: Dry, coarse cakes and cookies. Anything with nuts, seeds, coconut, pineapple, or dried fruit. Breakfast yogurt with nuts. Rice or bread pudding.  FOOD GROUP: Fats. RECOMMENDED: Butter, margarine, cream for cereal (depending on liquid consistency recommendations), gravy, cream sauces, sour cream, sour cream dips with soft additives, mayonnaise, salad dressings, cream cheese, cream cheese spreads with soft additives, whipped toppings.  AVOID: All fats with coarse or chunky additives.  FOOD GROUP: Fruits. RECOMMENDED: Soft drained, canned, or cooked fruits without seeds or skin. Fresh soft and ripe banana. Fruit juices with a small amount of pulp. If thin liquids are restricted, fruit juices should be thickened to appropriate consistency.  AVOID: Fresh or frozen fruits. Cooked fruit with skin or seeds. Dried fruits. Fresh, canned, or cooked pineapple.  FOOD GROUP: Meats and Meat Substitutes. (Meat pieces should not exceed 1/4 of an inch cube and should be tender.) RECOMMENDED: Moistened ground or cooked meat, poultry, or fish. Moist ground or tender meat may be served with gravy or sauce. Casseroles without rice. Moist macaroni and cheese, well-cooked pasta with meat sauce, tuna noodle casserole, soft, moist lasagna. Moist meatballs, meatloaf, or fish loaf. Protein salads, such as tuna or egg without large chunks, celery, or onion. Cottage cheese, smooth quiche without large chunks. Poached, scrambled, or soft-cooked eggs (egg yolks should not be  "runny" but should be moist and able to be mashed with butter, margarine, or other moisture added to them). (Cook eggs to 160 F or use pasteurized eggs for safety.) Souffls may have small, soft chunks. Tofu. Well-cooked, slightly mashed, moist legumes, such as baked beans. All meats or protein substitutes should be served with sauces or moistened to help maintain cohesiveness in the oral cavity.  AVOID: Dry meats, tough meats (such as bacon, sausage, hot dogs, bratwurst). Dry casseroles or casseroles with rice or large chunks. Peanut butter. Cheese slices and cubes. Hard-cooked or crisp fried eggs. Sandwiches.Pizza.  FOOD GROUP: Potatoes and Starches. RECOMMENDED: Well-cooked, moistened, boiled, baked, or mashed potatoes. Well-cooked shredded hash brown potatoes that are not crisp. (All potatoes need to be moist and in sauces.)Well-cooked noodles in sauce. Spaetzel or soft dumplings that have been moistened with butter or gravy.  AVOID: Potato skins and chips. Fried or French-fried potatoes. Rice.  FOOD GROUP: Soups. RECOMMENDED: Soups with easy-to-chew or easy-to-swallow meats or vegetables: Particle sizes in soups should be less than 1/2 inch. Soups will need to be thickened to appropriate consistency if soup is thinner than prescribed liquid consistency.  AVOID: Soups with large chunks of meat and vegetables. Soups with rice, corn, peas.  FOOD GROUP: Vegetables. RECOMMENDED: All soft, well-cooked vegetables. Vegetables should be less than a half inch. Should be easily mashed with a fork.  AVOID: Cooked corn and peas. Broccoli, cabbage, Brussels sprouts, asparagus, or other fibrous, non-tender or rubbery cooked vegetables.  FOOD GROUP: Miscellaneous. RECOMMENDED: Jams and preserves without seeds, jelly. Sauces, salsas, etc., that may have small tender chunks less than 1/2 inch. Soft, smooth chocolate bars that are easily chewed.  AVOID: Seeds, nuts, coconut, or sticky foods. Chewy candies  such as caramels or licorice.

## 2018-08-13 NOTE — Progress Notes (Signed)
ON RECALL  °

## 2018-08-13 NOTE — Progress Notes (Signed)
Subjective:    Patient ID: Kathy Hardy, female    DOB: 1956-04-10, 63 y.o.   MRN: [id number]  Bridget Hartshorn, NP  HPI DYSPHAGIA GETTING WORSE OVER LAST 2 YEARS. HAS HEARTBURN EVERY OTHER DAY AN DLAST NIGHT HAD IT REAL BAD. WEIGT DOWN 30 LBS OVER PAST 6 YRS BUT HAD A STROKE AND WENT TO [facility] AND WAS SUPPOSE TO HAVE EGD/DIL AND HAD HERNIA REPAIR-SWELLS UP AND DOWN. FEELS LIKE PACKING IS COMING OUT. BEEN SEEING PCP IN [place] AND NEEDS A NEW DOCTOR. TROUBLE WITH SOLIDS. JELLO GOES DOWN FINE. LAST TOOK BENTYL THE OTHER DAY. RARE DIARRHEA SINCE SHE CUT BACK ON MEDICINE. AFTER GB SURGERY THINGS GO STRAIGHT THROUGH. STAYS NAUSEATED. CHEST PAIN SINCE HAD THE FLU. MAY HAVE DOE/SOB. CONSTIPATION NOT A PROBLEM ANYMORE SINCE DID GB SURGERY. ABDOMINAL PAIN HERE RECENTLY SINCE "PACKING COMING OUT IT" SHE HAS A SHARP PAIN IN HER LEFT SIDE. APPETITE: SO/SO DUE TO NAUSEA.  PT DENIES FEVER, CHILLS, HEMATOCHEZIA, HEMATEMESIS, vomiting, melena, CHANGE IN BOWEL IN HABITS, constipation,OR problems with sedation.  Past Medical History:  Diagnosis Date  . Allergy   . Anxiety   . Anxiety   . Arthritis   . Asthma   . Chronic diastolic CHF (congestive heart failure) ([place])   . DDD (degenerative disc disease)    lumbar  . Diabetes mellitus   . 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 Diabetic neuropathy ([place])   . GERD (gastroesophageal reflux disease)   . Glaucoma   . Hyperlipidemia   . Hypertension   . Insomnia   . Neuropathy   . Peripheral vascular disease ([place])   . 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 RBBB   . Sleep apnea    Stop Bang score of 4  . Stress incontinence   . Stroke ([place])    TIAx 4,   . Substance abuse ([place])    cocaine  . Uterine cancer ([place]) 2000   unknown what type   Past Surgical History:  Procedure Laterality Date  . CARPAL TUNNEL RELEASE Bilateral   . CHOLECYSTECTOMY     [facility]  . COLONOSCOPY  07/09/2011   [facility]:JOINOMVE hemorrhoids/Poor prep in the right colon,  repeat in 2017  . ESOPHAGOGASTRODUODENOSCOPY  07/09/2011   [facility]:NOBS gastritis  . HERNIA REPAIR    . PARS PLANA VITRECTOMY  07/01/2012   Procedure: PARS PLANA VITRECTOMY WITH 25 GAUGE;  Surgeon: Hayden Pedro, MD;  Location: [facility];  Service: Ophthalmology;  Laterality: Right;  with Laser treatment, Membrane peel, Gas Injection and Repair of Traction Retinal Detachment RIGHT eye  . RETINAL DETACHMENT SURGERY Right 07/01/2012  . Right leg surgery x 4 (polio)     Allergies  Allergen Reactions  . Oxycodone Nausea Only and Itching    Nausea one time  . Latex Hives    Hives only  . Aspirin     Swelling, hives    Current Outpatient Medications  Medication Sig    . albuterol (PROVENTIL HFA;VENTOLIN HFA) 108 (90 BASE) MCG/ACT inhaler Inhale 2 puffs into the lungs every 6 (six) hours as needed for wheezing or shortness of breath.    Marland Kitchen amLODipine (NORVASC) 5 MG tablet TAKE 1 TABLET BY MOUTH DAILY.    . budesonide-formoterol (SYMBICORT) 160-4.5 MCG/ACT inhaler Inhale 2 puffs into the lungs 2 (two) times daily.    .      . clopidogrel (PLAVIX) 75 MG tablet TAKE 1 TABLET BY MOUTH EVERY MORNING - PT  WANTS SUN PHARM. BRAND (Patient taking differently: TAKE 1 TABLET BY MOUTH EVERY MORNING -)    .      . dicyclomine (BENTYL) 10 MG capsule TAKE 1 CAPSULE BY MOUTH EVERY 4 TO 6 HOURS AS NEEDED FOR DIARRHEA OR ABDOMINAL CRAMPS.    Marland Kitchen FLUoxetine (PROZAC) 40 MG capsule TAKE 1 CAPSULE BY MOUTH DAILY. (DOSE CHANGE)    . furosemide (LASIX) 20 MG tablet Take by mouth daily.     Marland Kitchen gabapentin (NEURONTIN) 300 MG capsule Take 1 capsule (300 mg total) by mouth 2 (two) times daily.    .      . hydrOXYzine (ATARAX/VISTARIL) 25 MG tablet Take 25 mg by mouth 3 (three) times daily as needed for anxiety or itching.    .      . Insulin Glargine (LANTUS SOLOSTAR) 100 UNIT/ML Solostar Pen Inject 35 Units into the skin daily at 10 pm.    .      . levocetirizine (XYZAL) 5 MG tablet TAKE 1 TABLET IN THE EVENING.    .      .  LORazepam (ATIVAN) 1 MG tablet Take 1 tablet (1 mg total) by mouth 3 (three) times daily as needed for anxiety.    . meclizine (ANTIVERT) 25 MG tablet TAKE 1 TABLET BY MOUTH THREE TIMES DAILY AS NEEDED    .      .      .      . montelukast (SINGULAIR) 10 MG tablet Take 10 mg by mouth at bedtime.    . ondansetron (ZOFRAN ODT) 4 MG disintegrating tablet Take 1 tablet (4 mg total) by mouth every 8 (eight) hours as needed for nausea.    .      . rosuvastatin (CRESTOR) 20 MG tablet     .       Family History  Problem Relation Age of Onset  . Diabetes Mother   . Hypertension Mother   . Arthritis Mother   . Diabetes Sister   . Arthritis Father   . Kidney disease Father        kidney failure  . Cancer Maternal Grandmother        type?  Marland Kitchen Hypertension Son   . Seizures Sister   . Pneumonia Brother   . Early death Brother 9       goiter  . Hypertension Son   . Anesthesia problems Neg Hx   . Hypotension Neg Hx   . Malignant hyperthermia Neg Hx   . Pseudochol deficiency Neg Hx   . Colon cancer Neg Hx   . Colon polyps Neg Hx     Social History   Socioeconomic History  . Marital status: Legally Separated    Spouse name: Not on file  . Number of children: 2  . Years of education: 56  . Highest education level: Not on file  Occupational History  . Occupation: disabled    Comment: "sugar", orthopedic  Social Needs  . Financial resource strain: Not on file  . Food insecurity:    Worry: Not on file    Inability: Not on file  . Transportation needs:    Medical: Not on file    Non-medical: Not on file  Tobacco Use  . Smoking status: Former Research scientist (life sciences)  . Smokeless tobacco: Never Used  Substance and Sexual Activity  . Alcohol use: Yes    Comment: occ beer  . Drug use: No  . Sexual activity: Not Currently    Birth control/protection:  None  Lifestyle  . Physical activity:    Days per week: Not on file    Minutes per session: Not on file  . Stress: Not on file  Relationships    . Social connections:    Talks on phone: Not on file    Gets together: Not on file    Attends religious service: Not on file    Active member of club or organization: Not on file    Attends meetings of clubs or organizations: Not on file    Relationship status: Not on file  Other Topics Concern  . Not on file  Social History Narrative   Lives at home with boyfriend   Disabled from back/leg/diabetes   Review of Systems PER HPI OTHERWISE ALL SYSTEMS ARE NEGATIVE.    Objective:   Physical Exam Vitals signs reviewed.  Constitutional:      General: She is not in acute distress.    Appearance: She is well-developed.  HENT:     Head: Normocephalic and atraumatic.     Mouth/Throat:     Mouth: Mucous membranes are moist.     Pharynx: No oropharyngeal exudate.     Comments: POOR DENTITION Eyes:     General: No scleral icterus.    Pupils: Pupils are equal, round, and reactive to light.  Neck:     Musculoskeletal: Normal range of motion and neck supple.  Cardiovascular:     Rate and Rhythm: Normal rate and regular rhythm.     Heart sounds: Normal heart sounds.  Pulmonary:     Effort: Pulmonary effort is normal. No respiratory distress.     Breath sounds: Normal breath sounds.  Abdominal:     General: Bowel sounds are normal. There is no distension.     Palpations: Abdomen is soft.     Tenderness: There is no abdominal tenderness.     Comments: Midline INCISION WELL HEALED  Musculoskeletal:        General: Deformity (bilateral lower extremities and hands) present.     Right lower leg: No edema.     Left lower leg: No edema.  Lymphadenopathy:     Cervical: No cervical adenopathy.  Neurological:     Mental Status: She is alert and oriented to person, place, and time. Mental status is at baseline.     Gait: Gait abnormal.     Comments: NO  NEW FOCAL DEFICITS, walks assisted with a walker           Assessment & Plan:

## 2018-08-13 NOTE — Addendum Note (Signed)
Addended by: Danie Binder on: 08/13/2018 10:21 AM   Modules accepted: Orders

## 2018-08-13 NOTE — Assessment & Plan Note (Signed)
AVERAGE RISK AND OVERDUE FOR TCS.  SCHEDULE TCS AFTER NEXT OPV.

## 2018-08-13 NOTE — Assessment & Plan Note (Addendum)
SYMPTOMS NOT CONTROLLED and likely due to uncontrolled gerd with peptic stricture.  SCHEDULE EGD/DIL W/ MAC DUE TO POLYPHARMACY/ANXIETY. DISCUSSED PROCEDURE, BENEFITS, & RISKS: < 1% chance of medication reaction, bleeding, OR perforation. HALF LANTUS THE NIGHT BEFORE. CONTINUE PLAVIX. PT VOICED HER UNDERSTANDING. Soft mechanical diet Re-start PROTONIX. TAKE 30 MINUTES PRIOR TO MEALS TWICE DAILY. AVOID reflux triggers.  HANDOUT GIVEN. FOLLOW UP IN 4 MOS.

## 2018-08-13 NOTE — Progress Notes (Signed)
cc'ed to pcp °

## 2018-08-13 NOTE — Telephone Encounter (Signed)
Pre-op appt scheduled for 08/26/2018 at 11:00am. LMOVM for pt. Letter mailed.

## 2018-08-22 NOTE — Patient Instructions (Signed)
Kathy Hardy  08/22/2018     @PREFPERIOPPHARMACY @   Your procedure is scheduled on  09/02/2018  Report to [facility] at  700   A.M.  Call this number if you have problems the morning of surgery:  [phone]   Remember:  Follow the diet instructions given to you by Dr Nona Dell office.                       Take these medicines the morning of surgery with A SIP OF WATER  Amlodipine, carvedilol, bentyl,prozac, gabapentin, hydroxyzine, lisinopril, ativan ( if needed), antivert( if needed), singulair, zofran ( if needed), protonix. Use your inhalers before you come. Only take 1/2 your night time insulin the night before your procedure. DO NOT take any diabetic medications the morning of your procedure.    Do not wear jewelry, make-up or nail polish.  Do not wear lotions, powders, or perfumes, or deodorant.  Do not shave 48 hours prior to surgery.  Men may shave face and neck.  Do not bring valuables to the hospital.  [facility] is not responsible for any belongings or valuables.  Contacts, dentures or bridgework may not be worn into surgery.  Leave your suitcase in the car.  After surgery it may be brought to your room.  For patients admitted to the hospital, discharge time will be determined by your treatment team.  Patients discharged the day of surgery will not be allowed to drive home.   Name and phone number of your driver:   family Special instructions:  None  Please read over the following fact sheets that you were given. Anesthesia Post-op Instructions and Care and Recovery After Surgery       Upper Endoscopy, Adult Upper endoscopy is a procedure to look inside the upper GI (gastrointestinal) tract. The upper GI tract is made up of:  The part of the body that moves food from your mouth to your stomach (esophagus).  The stomach.  The first part of your small intestine (duodenum). This procedure is also called esophagogastroduodenoscopy (EGD) or  gastroscopy. In this procedure, your health care provider passes a thin, flexible tube (endoscope) through your mouth and down your esophagus into your stomach. A small camera is attached to the end of the tube. Images from the camera appear on a monitor in the exam room. During this procedure, your health care provider may also remove a small piece of tissue to be sent to a lab and examined under a microscope (biopsy). Your health care provider may do an upper endoscopy to diagnose cancers of the upper GI tract. You may also have this procedure to find the cause of other conditions, such as:  Stomach pain.  Heartburn.  Pain or problems when swallowing.  Nausea and vomiting.  Stomach bleeding.  Stomach ulcers. Tell a health care provider about:  Any allergies you have.  All medicines you are taking, including vitamins, herbs, eye drops, creams, and over-the-counter medicines.  Any problems you or family members have had with anesthetic medicines.  Any blood disorders you have.  Any surgeries you have had.  Any medical conditions you have.  Whether you are pregnant or may be pregnant. What are the risks? Generally, this is a safe procedure. However, problems may occur, including:  Infection.  Bleeding.  Allergic reactions to medicines.  A tear or hole (perforation) in the esophagus, stomach, or duodenum. What  happens before the procedure? Staying hydrated Follow instructions from your health care provider about hydration, which may include:  Up to 2 hours before the procedure - you may continue to drink clear liquids, such as water, clear fruit juice, black coffee, and plain tea.  Eating and drinking restrictions Follow instructions from your health care provider about eating and drinking, which may include:  8 hours before the procedure - stop eating heavy meals or foods, such as meat, fried foods, or fatty foods.  6 hours before the procedure - stop eating light  meals or foods, such as toast or cereal.  6 hours before the procedure - stop drinking milk or drinks that contain milk.  2 hours before the procedure - stop drinking clear liquids. Medicines Ask your health care provider about:  Changing or stopping your regular medicines. This is especially important if you are taking diabetes medicines or blood thinners.  Taking medicines such as aspirin and ibuprofen. These medicines can thin your blood. Do not take these medicines unless your health care provider tells you to take them.  Taking over-the-counter medicines, vitamins, herbs, and supplements. General instructions  Plan to have someone take you home from the hospital or clinic.  If you will be going home right after the procedure, plan to have someone with you for 24 hours.  Ask your health care provider what steps will be taken to help prevent infection. What happens during the procedure?   An IV will be inserted into one of your veins.  You may be given one or more of the following: ? A medicine to help you relax (sedative). ? A medicine to numb the throat (local anesthetic).  You will lie on your left side on an exam table.  Your health care provider will pass the endoscope through your mouth and down your esophagus.  Your health care provider will use the scope to check the inside of your esophagus, stomach, and duodenum. Biopsies may be taken.  The endoscope will be removed. The procedure may vary among health care providers and hospitals. What happens after the procedure?  Your blood pressure, heart rate, breathing rate, and blood oxygen level will be monitored until you leave the hospital or clinic.  Do not drive for 24 hours if you were given a sedative during your procedure.  When your throat is no longer numb, you may be given some fluids to drink.  It is up to you to get the results of your procedure. Ask your health care provider, or the department that is  doing the procedure, when your results will be ready. Summary  Upper endoscopy is a procedure to look inside the upper GI tract.  During the procedure, an IV will be inserted into one of your veins. You may be given a medicine to help you relax.  A medicine will be used to numb your throat.  The endoscope will be passed through your mouth and down your esophagus. This information is not intended to replace advice given to you by your health care provider. Make sure you discuss any questions you have with your health care provider. Document Released: 07/13/2000 Document Revised: 12/16/2017 Document Reviewed: 12/16/2017 Elsevier Interactive Patient Education  2019 [place] [facility], Adult, Care After This sheet gives you information about how to care for yourself after your procedure. Your health care provider may also give you more specific instructions. If you have problems or questions, contact your health care provider. What can I  expect after the procedure? After the procedure, it is common to have:  A sore throat.  Mild stomach pain or discomfort.  Bloating.  Nausea. Follow these instructions at home:   Follow instructions from your health care provider about what to eat or drink after your procedure.  Return to your normal activities as told by your health care provider. Ask your health care provider what activities are safe for you.  Take over-the-counter and prescription medicines only as told by your health care provider.  Do not drive for 24 hours if you were given a sedative during your procedure.  Keep all follow-up visits as told by your health care provider. This is important. Contact a health care provider if you have:  A sore throat that lasts longer than one day.  Trouble swallowing. Get help right away if:  You vomit blood or your vomit looks like coffee grounds.  You have: ? A fever. ? Bloody, black, or tarry stools. ? A severe sore  throat or you cannot swallow. ? Difficulty breathing. ? Severe pain in your chest or abdomen. Summary  After the procedure, it is common to have a sore throat, mild stomach discomfort, bloating, and nausea.  Do not drive for 24 hours if you were given a sedative during the procedure.  Follow instructions from your health care provider about what to eat or drink after your procedure.  Return to your normal activities as told by your health care provider. This information is not intended to replace advice given to you by your health care provider. Make sure you discuss any questions you have with your health care provider. Document Released: 01/15/2012 Document Revised: 12/16/2017 Document Reviewed: 12/16/2017 Elsevier Interactive Patient Education  2019 [employer]  Esophageal Dilatation Esophageal dilatation, also called esophageal dilation, is a procedure to widen or open (dilate) a blocked or narrowed part of the esophagus. The esophagus is the part of the body that moves food and liquid from the mouth to the stomach. You may need this procedure if:  You have a buildup of scar tissue in your esophagus that makes it difficult, painful, or impossible to swallow. This can be caused by gastroesophageal reflux disease (GERD).  You have cancer of the esophagus.  There is a problem with how food moves through your esophagus. In some cases, you may need this procedure repeated at a later time to dilate the esophagus gradually. Tell a health care provider about:  Any allergies you have.  All medicines you are taking, including vitamins, herbs, eye drops, creams, and over-the-counter medicines.  Any problems you or family members have had with anesthetic medicines.  Any blood disorders you have.  Any surgeries you have had.  Any medical conditions you have.  Any antibiotic medicines you are required to take before dental procedures.  Whether you are pregnant or may be  pregnant. What are the risks? Generally, this is a safe procedure. However, problems may occur, including:  Bleeding due to a tear in the lining of the esophagus.  A hole (perforation) in the esophagus. What happens before the procedure?  Follow instructions from your health care provider about eating or drinking restrictions.  Ask your health care provider about changing or stopping your regular medicines. This is especially important if you are taking diabetes medicines or blood thinners.  Plan to have someone take you home from the hospital or clinic.  Plan to have a responsible adult care for you for at least 24 hours after  you leave the hospital or clinic. This is important. What happens during the procedure?  You may be given a medicine to help you relax (sedative).  A numbing medicine may be sprayed into the back of your throat, or you may gargle the medicine.  Your health care provider may perform the dilatation using various surgical instruments, such as: ? Simple dilators. This instrument is carefully placed in the esophagus to stretch it. ? Guided wire bougies. This involves using an endoscope to insert a wire into the esophagus. A dilator is passed over this wire to enlarge the esophagus. Then the wire is removed. ? Balloon dilators. An endoscope with a small balloon at the end is inserted into the esophagus. The balloon is inflated to stretch the esophagus and open it up. The procedure may vary among health care providers and hospitals. What happens after the procedure?  Your blood pressure, heart rate, breathing rate, and blood oxygen level will be monitored until the medicines you were given have worn off.  Your throat may feel slightly sore and numb. This will improve slowly over time.  You will not be allowed to eat or drink until your throat is no longer numb.  When you are able to drink, urinate, and sit on the edge of the bed without nausea or dizziness, you may  be able to return home. Follow these instructions at home:  Take over-the-counter and prescription medicines only as told by your health care provider.  Do not drive for 24 hours if you were given a sedative during your procedure.  You should have a responsible adult with you for 24 hours after the procedure.  Follow instructions from your health care provider about any eating or drinking restrictions.  Do not use any products that contain nicotine or tobacco, such as cigarettes and e-cigarettes. If you need help quitting, ask your health care provider.  Keep all follow-up visits as told by your health care provider. This is important. Get help right away if you:  Have a fever.  Have chest pain.  Have pain that is not relieved by medication.  Have trouble breathing.  Have trouble swallowing.  Vomit blood. Summary  Esophageal dilatation, also called esophageal dilation, is a procedure to widen or open (dilate) a blocked or narrowed part of the esophagus.  Plan to have someone take you home from the hospital or clinic.  For this procedure, a numbing medicine may be sprayed into the back of your throat, or you may gargle the medicine.  Do not drive for 24 hours if you were given a sedative during your procedure. This information is not intended to replace advice given to you by your health care provider. Make sure you discuss any questions you have with your health care provider. Document Released: 09/06/2005 Document Revised: 05/21/2017 Document Reviewed: 05/21/2017 Elsevier Interactive Patient Education  2019 Person Anesthesia is a term that refers to techniques, procedures, and medicines that help a person stay safe and comfortable during a medical procedure. Monitored anesthesia care, or sedation, is one type of anesthesia. Your anesthesia specialist may recommend sedation if you will be having a procedure that does not require you to be  unconscious, such as:  Cataract surgery.  A dental procedure.  A biopsy.  A colonoscopy. During the procedure, you may receive a medicine to help you relax (sedative). There are three levels of sedation:  Mild sedation. At this level, you may feel awake and relaxed. You  will be able to follow directions.  Moderate sedation. At this level, you will be sleepy. You may not remember the procedure.  Deep sedation. At this level, you will be asleep. You will not remember the procedure. The more medicine you are given, the deeper your level of sedation will be. Depending on how you respond to the procedure, the anesthesia specialist may change your level of sedation or the type of anesthesia to fit your needs. An anesthesia specialist will monitor you closely during the procedure. Let your health care provider know about:  Any allergies you have.  All medicines you are taking, including vitamins, herbs, eye drops, creams, and over-the-counter medicines.  Any use of steroids (by mouth or as a cream).  Any problems you or family members have had with sedatives and anesthetic medicines.  Any blood disorders you have.  Any surgeries you have had.  Any medical conditions you have, such as sleep apnea.  Whether you are pregnant or may be pregnant.  Any use of cigarettes, alcohol, or street drugs. What are the risks? Generally, this is a safe procedure. However, problems may occur, including:  Getting too much medicine (oversedation).  Nausea.  Allergic reaction to medicines.  Trouble breathing. If this happens, a breathing tube may be used to help with breathing. It will be removed when you are awake and breathing on your own.  Heart trouble.  Lung trouble. Before the procedure Staying hydrated Follow instructions from your health care provider about hydration, which may include:  Up to 2 hours before the procedure - you may continue to drink clear liquids, such as water,  clear fruit juice, black coffee, and plain tea. Eating and drinking restrictions Follow instructions from your health care provider about eating and drinking, which may include:  8 hours before the procedure - stop eating heavy meals or foods such as meat, fried foods, or fatty foods.  6 hours before the procedure - stop eating light meals or foods, such as toast or cereal.  6 hours before the procedure - stop drinking milk or drinks that contain milk.  2 hours before the procedure - stop drinking clear liquids. Medicines Ask your health care provider about:  Changing or stopping your regular medicines. This is especially important if you are taking diabetes medicines or blood thinners.  Taking medicines such as aspirin and ibuprofen. These medicines can thin your blood. Do not take these medicines before your procedure if your health care provider instructs you not to. Tests and exams  You will have a physical exam.  You may have blood tests done to show: ? How well your kidneys and liver are working. ? How well your blood can clot. General instructions  Plan to have someone take you home from the hospital or clinic.  If you will be going home right after the procedure, plan to have someone with you for 24 hours.  What happens during the procedure?  Your blood pressure, heart rate, breathing, level of pain and overall condition will be monitored.  An IV tube will be inserted into one of your veins.  Your anesthesia specialist will give you medicines as needed to keep you comfortable during the procedure. This may mean changing the level of sedation.  The procedure will be performed. After the procedure  Your blood pressure, heart rate, breathing rate, and blood oxygen level will be monitored until the medicines you were given have worn off.  Do not drive for 24 hours if you  received a sedative.  You may: ? Feel sleepy, clumsy, or nauseous. ? Feel forgetful about what  happened after the procedure. ? Have a sore throat if you had a breathing tube during the procedure. ? Vomit. This information is not intended to replace advice given to you by your health care provider. Make sure you discuss any questions you have with your health care provider. Document Released: 04/11/2005 Document Revised: 12/23/2015 Document Reviewed: 11/06/2015 Elsevier Interactive Patient Education  2019 [place], Care After These instructions provide you with information about caring for yourself after your procedure. Your health care provider may also give you more specific instructions. Your treatment has been planned according to current medical practices, but problems sometimes occur. Call your health care provider if you have any problems or questions after your procedure. What can I expect after the procedure? After your procedure, you may:  Feel sleepy for several hours.  Feel clumsy and have poor balance for several hours.  Feel forgetful about what happened after the procedure.  Have poor judgment for several hours.  Feel nauseous or vomit.  Have a sore throat if you had a breathing tube during the procedure. Follow these instructions at home: For at least 24 hours after the procedure:      Have a responsible adult stay with you. It is important to have someone help care for you until you are awake and alert.  Rest as needed.  Do not: ? Participate in activities in which you could fall or become injured. ? Drive. ? Use heavy machinery. ? Drink alcohol. ? Take sleeping pills or medicines that cause drowsiness. ? Make important decisions or sign legal documents. ? Take care of children on your own. Eating and drinking  Follow the diet that is recommended by your health care provider.  If you vomit, drink water, juice, or soup when you can drink without vomiting.  Make sure you have little or no nausea before eating solid  foods. General instructions  Take over-the-counter and prescription medicines only as told by your health care provider.  If you have sleep apnea, surgery and certain medicines can increase your risk for breathing problems. Follow instructions from your health care provider about wearing your sleep device: ? Anytime you are sleeping, including during daytime naps. ? While taking prescription pain medicines, sleeping medicines, or medicines that make you drowsy.  If you smoke, do not smoke without supervision.  Keep all follow-up visits as told by your health care provider. This is important. Contact a health care provider if:  You keep feeling nauseous or you keep vomiting.  You feel light-headed.  You develop a rash.  You have a fever. Get help right away if:  You have trouble breathing. Summary  For several hours after your procedure, you may feel sleepy and have poor judgment.  Have a responsible adult stay with you for at least 24 hours or until you are awake and alert. This information is not intended to replace advice given to you by your health care provider. Make sure you discuss any questions you have with your health care provider. Document Released: 11/06/2015 Document Revised: 03/01/2017 Document Reviewed: 11/06/2015 Elsevier Interactive Patient Education  2019 [employer].

## 2018-08-26 ENCOUNTER — Ambulatory Visit (HOSPITAL_COMMUNITY)
Admission: RE | Admit: 2018-08-26 | Discharge: 2018-08-26 | Disposition: A | Discharge status: Home / Self Care | Payer: Medicaid Other | Source: Ambulatory Visit | Attending: Gastroenterology | Admitting: Gastroenterology

## 2018-08-26 ENCOUNTER — Encounter (HOSPITAL_COMMUNITY): Payer: Self-pay

## 2018-08-28 NOTE — Telephone Encounter (Signed)
I spoke with the patient and she missed her pre-op appt.  I rescheduled her pre-op appt to 09/01/2018 at 11:30 am and the patient is aware.

## 2018-09-01 ENCOUNTER — Encounter (HOSPITAL_COMMUNITY): Admission: RE | Admit: 2018-09-01 | Payer: Medicaid Other | Source: Ambulatory Visit

## 2018-09-01 ENCOUNTER — Telehealth: Payer: Self-pay | Admitting: *Deleted

## 2018-09-01 NOTE — Telephone Encounter (Signed)
Pre-op scheduled for 10/29/2018 at 12:45pm. Letter mailed with instructions per pt request.

## 2018-09-01 NOTE — Telephone Encounter (Signed)
Patient called needing to r/s procedure scheduled for tomorrow. She reports she broke her knee and is recovering from this. She is now on for 11/04/2018 at 2:00pm. New instructions mailed. She requested I mail them to [address] in [place] [address]. Called carolyn in endo and LMOVM to change appt

## 2018-09-03 ENCOUNTER — Encounter: Payer: Self-pay | Admitting: Cardiovascular Disease

## 2018-09-03 ENCOUNTER — Ambulatory Visit: Payer: Self-pay | Admitting: Cardiovascular Disease

## 2018-09-15 ENCOUNTER — Other Ambulatory Visit: Payer: Self-pay

## 2018-09-15 DIAGNOSIS — Z1211 Encounter for screening for malignant neoplasm of colon: Secondary | ICD-10-CM

## 2018-09-18 ENCOUNTER — Ambulatory Visit: Payer: Medicaid Other | Admitting: Adult Health

## 2018-09-18 NOTE — Progress Notes (Deleted)
[facility] [address] [place]. [place] [address] [phone]       OFFICE CONSULT NOTE  [name] Date of Birth:  [birth year] Medical Record Number:  [id number]   Referring MD: Lars Mage, FNP  Reason for Referral:  Stroke f/u HPI:  09/18/18 VISIT:  Kathy Hardy is a 63 year old female who is being seen today for stroke follow-up.     INITIAL VISIT 06/16/18:  Kathy Hardy is a 63 year old pleasant African-American lady who is seen today for initial office consultation visit.  She is accompanied by her health aide.  She was referred to me for stroke follow-up.  Patient is unable to give me specific details about her stroke but apparently she had it about 10 years ago.  She was admitted to [facility] in [place].  I do not have the hospital records to review today.  She apparently followed up with neurologist Dr. Merlene Laughter and I do not have his records either.  The patient apparently did not like him and hence did not go for follow-up for more than a couple of visits.  She states that she developed sudden onset of left-sided weakness and numbness.  She was in the hospital for about a week or so and subsequently got some rehab and was discharged home and had some outpatient therapy.  She states she was able to walk and was started on Plavix for stroke prevention.  Her multiple vascular risk factors include diabetes, hypertension, hyperlipidemia and CHF.  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  Over the last several years she is undergone orthopedic correction surgeries for her right foot without significant benefit.  Her gait and balance have worsened and she was using a cane and more recently has been using a wheeled walker.  She also had some issues with chronic dysphagia and underwent esophageal stretching which also did not go well.  She states she has had no recurrent stroke or TIA symptoms.  She is on Plavix but  states that she does not like the medicine as it gives her some chest pain.  She apparently is allergic to aspirin and her chart documents history of hives with it.  She has not had any recent vascular imaging studies or any follow-up lipid profile checked.  Review of electronic medical records show last hemoglobin A1c was 12.8 in 2015 and LDL cholesterol was 66 mg percent.  Review of her imaging studies in PACS shows CT scan of the head which was normal on 09/08/2017 following unresponsive episode from hypoglycemia. and another CT scan on 07/05/2016 was also normal.  She does complain of tingling numbness and burning in her feet and hands from diabetic neuropathy which is also chronic.  She takes gabapentin 300 mg at night which apparently helps but she has never tried a daytime dose yet.  ROS:   14 system review of systems is positive for gait difficulty, leg weakness, numbness, balance difficulty and all other systems negative  PMH:  Past Medical History:  Diagnosis Date  . Allergy   . Anxiety   . Anxiety   . Arthritis   . Asthma   . Chronic diastolic CHF (congestive heart failure) ([place])   . DDD (degenerative disc disease)    lumbar  . Diabetes mellitus   . 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 Diabetic neuropathy ([place])   . GERD (gastroesophageal reflux disease)   . Glaucoma   .  Hyperlipidemia   . Hypertension   . Insomnia   . Neuropathy   . Peripheral vascular disease ([place])   . 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 RBBB   . Sleep apnea    Stop Bang score of 4  . Stress incontinence   . Stroke ([place])    TIAx 4,   . Substance abuse ([place])    cocaine  . Uterine cancer ([place]) 2000   unknown what type    Social History:  Social History   Socioeconomic History  . Marital status: Legally Separated    Spouse name: Not on file  . Number of children: 2  . Years of education: 3  . Highest education level: Not on file  Occupational History  . Occupation: disabled    Comment:  "sugar", orthopedic  Social Needs  . Financial resource strain: Not on file  . Food insecurity:    Worry: Not on file    Inability: Not on file  . Transportation needs:    Medical: Not on file    Non-medical: Not on file  Tobacco Use  . Smoking status: Former Research scientist (life sciences)  . Smokeless tobacco: Never Used  Substance and Sexual Activity  . Alcohol use: Yes    Comment: occ beer  . Drug use: No  . Sexual activity: Not Currently    Birth control/protection: None  Lifestyle  . Physical activity:    Days per week: Not on file    Minutes per session: Not on file  . Stress: Not on file  Relationships  . Social connections:    Talks on phone: Not on file    Gets together: Not on file    Attends religious service: Not on file    Active member of club or organization: Not on file    Attends meetings of clubs or organizations: Not on file    Relationship status: Not on file  . Intimate partner violence:    Fear of current or ex partner: Not on file    Emotionally abused: Not on file    Physically abused: Not on file    Forced sexual activity: Not on file  Other Topics Concern  . Not on file  Social History Narrative   Lives at home with boyfriend   Disabled from back/leg/diabetes    Medications:   Current Outpatient Medications on File Prior to Visit  Medication Sig Dispense Refill  . albuterol (PROVENTIL HFA;VENTOLIN HFA) 108 (90 BASE) MCG/ACT inhaler Inhale 2 puffs into the lungs every 6 (six) hours as needed for wheezing or shortness of breath.    Marland Kitchen amLODipine (NORVASC) 5 MG tablet TAKE 1 TABLET BY MOUTH DAILY. 30 tablet 6  . budesonide-formoterol (SYMBICORT) 160-4.5 MCG/ACT inhaler Inhale 2 puffs into the lungs 2 (two) times daily.    . carvedilol (COREG) 3.125 MG tablet TAKE (1) TABLET TWICE DAILY. 60 tablet 1  . clopidogrel (PLAVIX) 75 MG tablet TAKE 1 TABLET BY MOUTH EVERY MORNING - PT WANTS SUN PHARM. BRAND (Patient taking differently: TAKE 1 TABLET BY MOUTH EVERY MORNING -) 30  tablet 3  . CRESTOR 20 MG tablet TAKE 1 TABLET BY MOUTH EVERY DAY 30 tablet 6  . dicyclomine (BENTYL) 10 MG capsule TAKE 1 CAPSULE BY MOUTH EVERY 4 TO 6 HOURS AS NEEDED FOR DIARRHEA OR ABDOMINAL CRAMPS. 120 capsule 0  . FLUoxetine (PROZAC) 40 MG capsule TAKE 1 CAPSULE BY MOUTH DAILY. (DOSE CHANGE) 30 capsule 3  . furosemide (LASIX) 20 MG tablet  Take by mouth daily.     Marland Kitchen gabapentin (NEURONTIN) 300 MG capsule Take 1 capsule (300 mg total) by mouth 2 (two) times daily. 30 capsule 6  . glucose blood test strip Check blood sugar 4 times daily    . hydrOXYzine (ATARAX/VISTARIL) 25 MG tablet Take 25 mg by mouth 3 (three) times daily as needed for anxiety or itching.    . hyoscyamine (LEVSIN SL) 0.125 MG SL tablet Place 1 tablet (0.125 mg total) under the tongue every 4 (four) hours as needed. 120 tablet 3  . Insulin Glargine (LANTUS SOLOSTAR) 100 UNIT/ML Solostar Pen Inject 30 Units into the skin daily at 10 pm. 10 pen 3  . Insulin Glargine (LANTUS) 100 UNIT/ML Solostar Pen Inject into the skin.    Marland Kitchen levocetirizine (XYZAL) 5 MG tablet TAKE 1 TABLET IN THE EVENING. 30 tablet 0  . lisinopril (PRINIVIL,ZESTRIL) 20 MG tablet Take 20 mg by mouth daily.    Marland Kitchen LORazepam (ATIVAN) 1 MG tablet Take 1 tablet (1 mg total) by mouth 3 (three) times daily as needed for anxiety. 20 tablet 0  . meclizine (ANTIVERT) 25 MG tablet TAKE 1 TABLET BY MOUTH THREE TIMES DAILY AS NEEDED 30 tablet 0  . metFORMIN (GLUCOPHAGE) 500 MG tablet Take 500 mg by mouth 2 (two) times daily with a meal.    . Misc. Devices MISC Life line    . Misc. Devices MISC depends    . Misc. Devices MISC Compression hose  15 mm thigh high    . montelukast (SINGULAIR) 10 MG tablet Take 10 mg by mouth at bedtime.    . ondansetron (ZOFRAN ODT) 4 MG disintegrating tablet Take 1 tablet (4 mg total) by mouth every 8 (eight) hours as needed for nausea. 10 tablet 0  . pantoprazole (PROTONIX) 40 MG tablet Take 1 tablet (40 mg total) by mouth 2 (two) times daily  before a meal. 60 tablet 11  . rosuvastatin (CRESTOR) 20 MG tablet Take by mouth.     Current Facility-Administered Medications on File Prior to Visit  Medication Dose Route Frequency Provider Last Rate Last Dose  . Influenza (>/= 3 years) inactive virus vaccine (FLVIRIN/FLUZONE) injection SUSP 0.5 mL  0.5 mL Intramuscular Once Fayrene Helper, MD        Allergies:   Allergies  Allergen Reactions  . Oxycodone Nausea Only and Itching    Nausea one time  . Latex Hives    Hives only  . Aspirin     Swelling, hives     Physical Exam General: frail middle aged african american lady, seated, in no evident distress Head: head normocephalic and atraumatic.   Neck: supple with no carotid or supraclavicular bruits Cardiovascular: regular rate and rhythm, no murmurs Musculoskeletal: no deformity Skin:  no rash/petichiae. Trace pedal edema bilaterally Vascular:  Normal pulses all extremities  Neurologic Exam Mental Status: Awake and fully alert. Oriented to place and time. Recent and remote memory intact. Attention span, concentration and fund of knowledge appropriate. Mood and affect appropriate.  Cranial Nerves: Fundoscopic exam reveals sharp disc margins. Pupils equal, briskly reactive to light. Extraocular movements full without nystagmus. Visual fields full to confrontation. Hearing intact. Facial sensation intact. Face, tongue, palate moves normally and symmetrically.  Motor:wasting of right leg muscles with decrease in size and length. Mild left grip and intrinsic hand muscle weakness. Mild bilateral hip flexor weakness. Right foot drop with 0/5 ankle weakness on right and mild left ankle dorsiflexor weakness 4/5.  Sensory.: diminished  touch , pinprick , position and vibratory sensation in both ankles down.  Coordination: Rapid alternating movements normal in all extremities. Finger-to-nose and heel-to-shin performed accurately bilaterally. Gait and Station: Arises from chair with   difficulty. Stance is wide based. Gait demonstrates mild ataxia and uses a wheled walker . Not able  to heel, toe and tandem walk without difficulty.  Reflexes: 1+ and symmetric except right ankle jerk is absent and left id depressed.. Toes downgoing.   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     PLAN: I had a long discussion with the patient and her health aide regarding her remote stroke which I suspect was a right brain subcortical lacunar infarct as well as her right leg weakness from childhood polio as well as lower extremity paresthesias from chronic diabetic sensory neuropathy and answered questions.  Continue Plavix  for stroke prevention as she is she is allergic to aspirin and has had hives in the past with it.  Maintain strict control of her risk factors with blood pressure goal below 130/90, diabetes with hemoglobin A1c goal below 6.5% and lipids with LDL cholesterol goal below 70 mg percent.  I advised her not to smoke.  We also discussed increasing gabapentin to 300 mg twice daily to help with her paresthesias.  Refer to physical therapy for right foot brace and gait and balance training.  Check hemoglobin A1c, lipid profile and urine drug screen as well as carotid ultrasound.      Greater than 50% time during this 45-minute consultation visit was spent on counseling and coordination of care about her remote stroke, diabetic neuropathy and answering questions  Venancio Poisson, AGNP-BC  [facility] [address] [place],  [address]  Phone [phone] Fax [phone] Note: This document  was prepared with digital dictation and possible smart phrase technology. Any transcriptional errors that result from this process are unintentional.

## 2018-09-22 ENCOUNTER — Encounter: Payer: Self-pay | Admitting: Adult Health

## 2018-10-28 NOTE — Patient Instructions (Signed)
Kathy Hardy  10/28/2018     @PREFPERIOPPHARMACY @   Your procedure is scheduled on 11/04/2018.  Report to [facility] at 12:30 A.M.  Call this number if you have problems the morning of surgery:  [phone]   Remember:  Do not eat or drink after midnight.  Follow the instructions given to you by Dr. Oneida Alar office    Take these medicines the morning of surgery with A SIP OF WATER Lisinopril, Ativan, Protonix, Norvasc, Coreg, Prozac and Neurontin. Please use your inhalers before coming to the hospital the am of surgery.     Do not wear jewelry, make-up or nail polish.  Do not wear lotions, powders, or perfumes, or deodorant.  Do not shave 48 hours prior to surgery.  Men may shave face and neck.  Do not bring valuables to the hospital.  [facility] is not responsible for any belongings or valuables.  Contacts, dentures or bridgework may not be worn into surgery.  Leave your suitcase in the car.  After surgery it may be brought to your room.  For patients admitted to the hospital, discharge time will be determined by your treatment team.  Patients discharged the day of surgery will not be allowed to drive home.   Name and phone number of your driver:   family Special instructions:  n/a  Please read over the following fact sheets that you were given. Care and Recovery After Surgery   Upper Endoscopy, Adult Upper endoscopy is a procedure to look inside the upper GI (gastrointestinal) tract. The upper GI tract is made up of:  The part of the body that moves food from your mouth to your stomach (esophagus).  The stomach.  The first part of your small intestine (duodenum). This procedure is also called esophagogastroduodenoscopy (EGD) or gastroscopy. In this procedure, your health care provider passes a thin, flexible tube (endoscope) through your mouth and down your esophagus into your stomach. A small camera is attached to the end of the tube. Images from the camera appear  on a monitor in the exam room. During this procedure, your health care provider may also remove a small piece of tissue to be sent to a lab and examined under a microscope (biopsy). Your health care provider may do an upper endoscopy to diagnose cancers of the upper GI tract. You may also have this procedure to find the cause of other conditions, such as:  Stomach pain.  Heartburn.  Pain or problems when swallowing.  Nausea and vomiting.  Stomach bleeding.  Stomach ulcers. Tell a health care provider about:  Any allergies you have.  All medicines you are taking, including vitamins, herbs, eye drops, creams, and over-the-counter medicines.  Any problems you or family members have had with anesthetic medicines.  Any blood disorders you have.  Any surgeries you have had.  Any medical conditions you have.  Whether you are pregnant or may be pregnant. What are the risks? Generally, this is a safe procedure. However, problems may occur, including:  Infection.  Bleeding.  Allergic reactions to medicines.  A tear or hole (perforation) in the esophagus, stomach, or duodenum. What happens before the procedure? Staying hydrated Follow instructions from your health care provider about hydration, which may include:  Up to 2 hours before the procedure - you may continue to drink clear liquids, such as water, clear fruit juice, black coffee, and plain tea.  Eating and drinking restrictions Follow instructions from your health care provider about eating and  drinking, which may include:  8 hours before the procedure - stop eating heavy meals or foods, such as meat, fried foods, or fatty foods.  6 hours before the procedure - stop eating light meals or foods, such as toast or cereal.  6 hours before the procedure - stop drinking milk or drinks that contain milk.  2 hours before the procedure - stop drinking clear liquids. Medicines Ask your health care provider about:   Changing or stopping your regular medicines. This is especially important if you are taking diabetes medicines or blood thinners.  Taking medicines such as aspirin and ibuprofen. These medicines can thin your blood. Do not take these medicines unless your health care provider tells you to take them.  Taking over-the-counter medicines, vitamins, herbs, and supplements. General instructions  Plan to have someone take you home from the hospital or clinic.  If you will be going home right after the procedure, plan to have someone with you for 24 hours.  Ask your health care provider what steps will be taken to help prevent infection. What happens during the procedure?   An IV will be inserted into one of your veins.  You may be given one or more of the following: ? A medicine to help you relax (sedative). ? A medicine to numb the throat (local anesthetic).  You will lie on your left side on an exam table.  Your health care provider will pass the endoscope through your mouth and down your esophagus.  Your health care provider will use the scope to check the inside of your esophagus, stomach, and duodenum. Biopsies may be taken.  The endoscope will be removed. The procedure may vary among health care providers and hospitals. What happens after the procedure?  Your blood pressure, heart rate, breathing rate, and blood oxygen level will be monitored until you leave the hospital or clinic.  Do not drive for 24 hours if you were given a sedative during your procedure.  When your throat is no longer numb, you may be given some fluids to drink.  It is up to you to get the results of your procedure. Ask your health care provider, or the department that is doing the procedure, when your results will be ready. Summary  Upper endoscopy is a procedure to look inside the upper GI tract.  During the procedure, an IV will be inserted into one of your veins. You may be given a medicine to help  you relax.  A medicine will be used to numb your throat.  The endoscope will be passed through your mouth and down your esophagus. This information is not intended to replace advice given to you by your health care provider. Make sure you discuss any questions you have with your health care provider. Document Released: 07/13/2000 Document Revised: 12/16/2017 Document Reviewed: 12/16/2017 Elsevier Interactive Patient Education  2019 [employer].

## 2018-10-29 ENCOUNTER — Encounter (HOSPITAL_COMMUNITY)
Admission: RE | Admit: 2018-10-29 | Discharge: 2018-10-29 | Disposition: A | Discharge status: Home / Self Care | Payer: Medicaid Other | Source: Ambulatory Visit | Attending: Gastroenterology | Admitting: Gastroenterology

## 2018-10-30 ENCOUNTER — Telehealth: Payer: Self-pay | Admitting: *Deleted

## 2018-10-30 NOTE — Telephone Encounter (Signed)
Spoke w/ patient and she is going to call to r/s pre-op appt.

## 2018-10-30 NOTE — Telephone Encounter (Signed)
-----   Message from Encarnacion Chu, RN sent at 10/30/2018 12:22 PM EDT ----- Regarding: No show Hi Mindy. Willisha Sligar was a no show for her PAT. I attempted to contact her and was unsuccessful. Her procedure is scheduled for 11/03/2018.

## 2018-10-31 ENCOUNTER — Encounter (HOSPITAL_COMMUNITY): Payer: Self-pay

## 2018-10-31 ENCOUNTER — Encounter (HOSPITAL_COMMUNITY)
Admission: RE | Admit: 2018-10-31 | Discharge: 2018-10-31 | Disposition: A | Discharge status: Home / Self Care | Payer: Medicaid Other | Source: Ambulatory Visit | Attending: Gastroenterology | Admitting: Gastroenterology

## 2018-10-31 ENCOUNTER — Other Ambulatory Visit: Payer: Self-pay

## 2018-10-31 NOTE — Patient Instructions (Signed)
Instructed to  Do social distancing,  For patient and aid

## 2018-11-03 ENCOUNTER — Telehealth: Payer: Self-pay

## 2018-11-03 NOTE — Telephone Encounter (Signed)
Called pt, EGD/DIL for tomorrow moved up to 7:30am. She will arrive at 6:15am. Spoke to pt and her nurse aide. Advised her to start clear liquids today after 6:00pm and NPO after midnight. Endo scheduler informed.

## 2018-11-03 NOTE — Anesthesia Preprocedure Evaluation (Addendum)
Anesthesia Evaluation  Patient identified by MRN, date of birth, ID band Patient awake    Reviewed: Allergy & Precautions, NPO status , Patient's Chart, lab work & pertinent test results  Airway Mallampati: II  TM Distance: >3 FB Neck ROM: Full    Dental no notable dental hx. (+) Poor Dentition   Pulmonary asthma , sleep apnea , former smoker,    Pulmonary exam normal breath sounds clear to auscultation       Cardiovascular Exercise Tolerance: Poor hypertension, Pt. on medications + Peripheral Vascular Disease and +CHF  Normal cardiovascular exam+ dysrhythmias II Rhythm:Regular Rate:Normal  H/o polio , uses walker -   Neuro/Psych Anxiety negative neurological ROS  negative psych ROS   GI/Hepatic Neg liver ROS, GERD  Medicated and Controlled,  Endo/Other  negative endocrine ROSdiabetes  Renal/GU negative Renal ROS  negative genitourinary   Musculoskeletal  (+) Arthritis , Osteoarthritis,    Abdominal   Peds negative pediatric ROS (+)  Hematology negative hematology ROS (+)   Anesthesia Other Findings   Reproductive/Obstetrics negative OB ROS                            Anesthesia Physical Anesthesia Plan  ASA: III  Anesthesia Plan: General   Post-op Pain Management:    Induction: Intravenous  PONV Risk Score and Plan:   Airway Management Planned: Nasal Cannula and Simple Face Mask  Additional Equipment:   Intra-op Plan:   Post-operative Plan:   Informed Consent: I have reviewed the patients History and Physical, chart, labs and discussed the procedure including the risks, benefits and alternatives for the proposed anesthesia with the patient or authorized representative who has indicated his/her understanding and acceptance.     Dental advisory given  Plan Discussed with: CRNA  Anesthesia Plan Comments: (This case was deemed Urgent by Dr. Oneida Alar in light of current  COVID concerns  Full PPE use planned )       Anesthesia Quick Evaluation

## 2018-11-04 ENCOUNTER — Encounter (HOSPITAL_COMMUNITY): Payer: Self-pay

## 2018-11-04 ENCOUNTER — Encounter (HOSPITAL_COMMUNITY)
Admission: RE | Disposition: A | Discharge status: Home / Self Care | Payer: Self-pay | Source: Home / Self Care | Attending: Gastroenterology

## 2018-11-04 ENCOUNTER — Ambulatory Visit (HOSPITAL_COMMUNITY): Payer: Medicaid Other | Admitting: Anesthesiology

## 2018-11-04 ENCOUNTER — Ambulatory Visit (HOSPITAL_COMMUNITY)
Admission: RE | Admit: 2018-11-04 | Discharge: 2018-11-04 | Disposition: A | Discharge status: Home / Self Care | Payer: Medicaid Other | Attending: Gastroenterology | Admitting: Gastroenterology

## 2018-11-04 ENCOUNTER — Other Ambulatory Visit: Payer: Self-pay

## 2018-11-04 DIAGNOSIS — Z87891 Personal history of nicotine dependence: Secondary | ICD-10-CM | POA: Insufficient documentation

## 2018-11-04 DIAGNOSIS — I110 Hypertensive heart disease with heart failure: Secondary | ICD-10-CM | POA: Diagnosis not present

## 2018-11-04 DIAGNOSIS — K228 Other specified diseases of esophagus: Secondary | ICD-10-CM | POA: Insufficient documentation

## 2018-11-04 DIAGNOSIS — Z794 Long term (current) use of insulin: Secondary | ICD-10-CM | POA: Insufficient documentation

## 2018-11-04 DIAGNOSIS — Z8673 Personal history of transient ischemic attack (TIA), and cerebral infarction without residual deficits: Secondary | ICD-10-CM | POA: Insufficient documentation

## 2018-11-04 DIAGNOSIS — M1990 Unspecified osteoarthritis, unspecified site: Secondary | ICD-10-CM | POA: Diagnosis not present

## 2018-11-04 DIAGNOSIS — Z7902 Long term (current) use of antithrombotics/antiplatelets: Secondary | ICD-10-CM | POA: Insufficient documentation

## 2018-11-04 DIAGNOSIS — I5032 Chronic diastolic (congestive) heart failure: Secondary | ICD-10-CM | POA: Insufficient documentation

## 2018-11-04 DIAGNOSIS — F419 Anxiety disorder, unspecified: Secondary | ICD-10-CM | POA: Diagnosis not present

## 2018-11-04 DIAGNOSIS — G4730 Sleep apnea, unspecified: Secondary | ICD-10-CM | POA: Insufficient documentation

## 2018-11-04 DIAGNOSIS — E1139 Type 2 diabetes mellitus with other diabetic ophthalmic complication: Secondary | ICD-10-CM | POA: Diagnosis not present

## 2018-11-04 DIAGNOSIS — Z91040 Latex allergy status: Secondary | ICD-10-CM | POA: Insufficient documentation

## 2018-11-04 DIAGNOSIS — K219 Gastro-esophageal reflux disease without esophagitis: Secondary | ICD-10-CM | POA: Insufficient documentation

## 2018-11-04 DIAGNOSIS — E785 Hyperlipidemia, unspecified: Secondary | ICD-10-CM | POA: Diagnosis not present

## 2018-11-04 DIAGNOSIS — H42 Glaucoma in diseases classified elsewhere: Secondary | ICD-10-CM | POA: Insufficient documentation

## 2018-11-04 DIAGNOSIS — K224 Dyskinesia of esophagus: Secondary | ICD-10-CM | POA: Insufficient documentation

## 2018-11-04 DIAGNOSIS — E1151 Type 2 diabetes mellitus with diabetic peripheral angiopathy without gangrene: Secondary | ICD-10-CM | POA: Insufficient documentation

## 2018-11-04 DIAGNOSIS — Z7951 Long term (current) use of inhaled steroids: Secondary | ICD-10-CM | POA: Diagnosis not present

## 2018-11-04 DIAGNOSIS — R1310 Dysphagia, unspecified: Secondary | ICD-10-CM

## 2018-11-04 DIAGNOSIS — R634 Abnormal weight loss: Secondary | ICD-10-CM | POA: Insufficient documentation

## 2018-11-04 DIAGNOSIS — J45909 Unspecified asthma, uncomplicated: Secondary | ICD-10-CM | POA: Insufficient documentation

## 2018-11-04 DIAGNOSIS — I4510 Unspecified right bundle-branch block: Secondary | ICD-10-CM | POA: Diagnosis not present

## 2018-11-04 DIAGNOSIS — G4700 Insomnia, unspecified: Secondary | ICD-10-CM | POA: Diagnosis not present

## 2018-11-04 DIAGNOSIS — E1140 Type 2 diabetes mellitus with diabetic neuropathy, unspecified: Secondary | ICD-10-CM | POA: Diagnosis not present

## 2018-11-04 DIAGNOSIS — Z8542 Personal history of malignant neoplasm of other parts of uterus: Secondary | ICD-10-CM | POA: Insufficient documentation

## 2018-11-04 DIAGNOSIS — Z7982 Long term (current) use of aspirin: Secondary | ICD-10-CM | POA: Diagnosis not present

## 2018-11-04 DIAGNOSIS — Z79899 Other long term (current) drug therapy: Secondary | ICD-10-CM | POA: Diagnosis not present

## 2018-11-04 DIAGNOSIS — Z885 Allergy status to narcotic agent status: Secondary | ICD-10-CM | POA: Insufficient documentation

## 2018-11-04 DIAGNOSIS — Z886 Allergy status to analgesic agent status: Secondary | ICD-10-CM | POA: Insufficient documentation

## 2018-11-04 DIAGNOSIS — Z8249 Family history of ischemic heart disease and other diseases of the circulatory system: Secondary | ICD-10-CM | POA: Insufficient documentation

## 2018-11-04 HISTORY — PX: SAVORY DILATION: SHX5439

## 2018-11-04 HISTORY — PX: ESOPHAGOGASTRODUODENOSCOPY (EGD) WITH PROPOFOL: SHX5813

## 2018-11-04 HISTORY — PX: ESOPHAGEAL BRUSHING: SHX6842

## 2018-11-04 LAB — CBC WITH DIFFERENTIAL/PLATELET
Abs Immature Granulocytes: 0.01 10*3/uL (ref 0.00–0.07)
Basophils Absolute: 0 10*3/uL (ref 0.0–0.1)
Basophils Relative: 1 %
Eosinophils Absolute: 0.1 10*3/uL (ref 0.0–0.5)
Eosinophils Relative: 2 %
HCT: 34.3 % — ABNORMAL LOW (ref 36.0–46.0)
Hemoglobin: 11 g/dL — ABNORMAL LOW (ref 12.0–15.0)
Immature Granulocytes: 0 %
Lymphocytes Relative: 30 %
Lymphs Abs: 1.6 10*3/uL (ref 0.7–4.0)
MCH: 29.5 pg (ref 26.0–34.0)
MCHC: 32.1 g/dL (ref 30.0–36.0)
MCV: 92 fL (ref 80.0–100.0)
Monocytes Absolute: 0.4 10*3/uL (ref 0.1–1.0)
Monocytes Relative: 8 %
Neutro Abs: 3.1 10*3/uL (ref 1.7–7.7)
Neutrophils Relative %: 59 %
Platelets: 192 10*3/uL (ref 150–400)
RBC: 3.73 MIL/uL — ABNORMAL LOW (ref 3.87–5.11)
RDW: 13 % (ref 11.5–15.5)
WBC: 5.2 10*3/uL (ref 4.0–10.5)
nRBC: 0 % (ref 0.0–0.2)

## 2018-11-04 LAB — COMPREHENSIVE METABOLIC PANEL
ALT: 12 U/L (ref 0–44)
AST: 17 U/L (ref 15–41)
Albumin: 4.3 g/dL (ref 3.5–5.0)
Alkaline Phosphatase: 88 U/L (ref 38–126)
Anion gap: 9 (ref 5–15)
BUN: 31 mg/dL — ABNORMAL HIGH (ref 6–20)
CO2: 28 mmol/L (ref 22–32)
Calcium: 9.5 mg/dL (ref 8.9–10.3)
Chloride: 99 mmol/L (ref 98–111)
Creatinine, Ser: 0.8 mg/dL (ref 0.44–1.00)
GFR calc Af Amer: >60 mL/min (ref >60)
GFR calc non Af Amer: >60 mL/min (ref >60)
Glucose, Bld: 323 mg/dL — ABNORMAL HIGH (ref 70–99)
Potassium: 4.1 mmol/L (ref 3.5–5.1)
Sodium: 136 mmol/L (ref 135–145)
Total Bilirubin: 0.3 mg/dL (ref 0.3–1.2)
Total Protein: 7.4 g/dL (ref 6.5–8.1)

## 2018-11-04 LAB — KOH PREP: KOH Prep: NONE SEEN

## 2018-11-04 LAB — GLUCOSE, CAPILLARY: Glucose-Capillary: 305 mg/dL — ABNORMAL HIGH (ref 70–99)

## 2018-11-04 SURGERY — ESOPHAGOGASTRODUODENOSCOPY (EGD) WITH PROPOFOL
Anesthesia: General

## 2018-11-04 MED ORDER — PROPOFOL 10 MG/ML IV BOLUS
INTRAVENOUS | Status: DC | PRN
  Administered 2018-11-04 (×2): 15 mg via INTRAVENOUS

## 2018-11-04 MED ORDER — KETAMINE HCL 50 MG/5ML IJ SOSY
PREFILLED_SYRINGE | INTRAMUSCULAR | Status: AC
  Filled 2018-11-04: qty 5

## 2018-11-04 MED ORDER — MIDAZOLAM HCL 2 MG/2ML IJ SOLN: 0.5000 mg | Freq: Once | INTRAMUSCULAR | Status: DC | PRN

## 2018-11-04 MED ORDER — CHLORHEXIDINE GLUCONATE CLOTH 2 % EX PADS: 6.0000 | MEDICATED_PAD | Freq: Once | CUTANEOUS | Status: DC

## 2018-11-04 MED ORDER — LACTATED RINGERS IV SOLN
INTRAVENOUS | Status: DC | PRN
  Administered 2018-11-04: 07:00:00 via INTRAVENOUS

## 2018-11-04 MED ORDER — LACTATED RINGERS IV SOLN: INTRAVENOUS | Status: DC

## 2018-11-04 MED ORDER — LIDOCAINE VISCOUS HCL 2 % MT SOLN: 5.0000 mL | Freq: Once | OROMUCOSAL | Status: DC

## 2018-11-04 MED ORDER — PROPOFOL 500 MG/50ML IV EMUL
INTRAVENOUS | Status: DC | PRN
  Administered 2018-11-04: 100 ug/kg/min via INTRAVENOUS

## 2018-11-04 MED ORDER — KETAMINE HCL 10 MG/ML IJ SOLN
INTRAMUSCULAR | Status: DC | PRN
  Administered 2018-11-04 (×2): 5 mg via INTRAVENOUS
  Administered 2018-11-04: 10 mg via INTRAVENOUS

## 2018-11-04 MED ORDER — SODIUM CHLORIDE 0.9% FLUSH
INTRAVENOUS | Status: AC
  Filled 2018-11-04: qty 10

## 2018-11-04 MED ORDER — PROMETHAZINE HCL 25 MG/ML IJ SOLN: 6.2500 mg | INTRAMUSCULAR | Status: DC | PRN

## 2018-11-04 MED ORDER — LIDOCAINE VISCOUS HCL 2 % MT SOLN
OROMUCOSAL | Status: AC
  Filled 2018-11-04: qty 15

## 2018-11-04 MED ORDER — PROPOFOL 10 MG/ML IV BOLUS
INTRAVENOUS | Status: AC
  Filled 2018-11-04: qty 40

## 2018-11-04 NOTE — Op Note (Signed)
[facility] Patient Name: Kathy Hardy Procedure Date: 11/04/2018 7:14 AM MRN: [id number] Date of Birth: 1956/07/22 Attending MD: Barney Drain MD, MD CSN: [id number] Age: 63 Admit Type: Outpatient Procedure:                Upper GI endoscopy WITH BRUSH BIOPSY/ESOPHAGEAL                            DILATION Indications:              Dysphagia-PMHx:GERD, UNINTENTIONAL WEIGHT LOSS:                            2015: 164 LBS AND IN JUN 2020 WEIGHT WAS 143 LBS. Providers:                Barney Drain MD, MD, Hinton Rao, RN, Raphael Gibney, Technician, Randa Spike, Technician Referring MD:             Stoney Bang MD, MD Medicines:                Propofol per Anesthesia Complications:            No immediate complications. Estimated Blood Loss:     Estimated blood loss was minimal. Procedure:                Pre-Anesthesia Assessment:                           - Prior to the procedure, a History and Physical                            was performed, and patient medications and                            allergies were reviewed. The patient's tolerance of                            previous anesthesia was also reviewed. The risks                            and benefits of the procedure and the sedation                            options and risks were discussed with the patient.                            All questions were answered, and informed consent                            was obtained. Prior Anticoagulants: The patient has                            taken Plavix (clopidogrel), last dose was 2 days  prior to procedure. ASA Grade Assessment: II - A                            patient with mild systemic disease. After reviewing                            the risks and benefits, the patient was deemed in                            satisfactory condition to undergo the procedure.                            After obtaining informed  consent, the endoscope was                            passed under direct vision. Throughout the                            procedure, the patient's blood pressure, pulse, and                            oxygen saturations were monitored continuously. The                            GIF-H190 ([id number]) scope was introduced through the                            mouth, and advanced to the second part of duodenum.                            The upper GI endoscopy was somewhat difficult due                            to presence of food. The patient tolerated the                            procedure well. Scope In: 7:47:01 AM Scope Out: 8:24:23 AM Total Procedure Duration: 0 hours 9 minutes 45 seconds  Findings:      Abnormal motility was noted in the middle third of the esophagus. The       cricopharyngeus was normal. There is spasticity of the esophageal body       ASSOCIATED WITH WHITE PLAQUES PROXIMAL TO THIS REGION. The distal       esophagus/lower esophageal sphincter is open. A guidewire was placed and       the scope was withdrawn TO ADDRESS OCCULT PROXIMAL ESOPHAGEAL WEB.       Dilation was performed with a Savary dilator with mild resistance at 16       mm. BRUSH for cytology were obtained IN THE PROXIMAL TO MID-ESOPHAGUS TO       EVALUATE FOR CANDIDA ESOPHAGITIS.      No gross lesions were noted in the entire examined stomach. SCOPE PASSED       THROUGH PYLORUS WITHOUT RESISTANCE.      The examined duodenum was normal. Impression:               -  POSSIBLE Abnormal esophageal motility.                           - No gross lesions in the stomach. LIMITED VIEW DUE                            TO RETAINED CONTENTS IN THE FUNDUS AND CARDIA. Moderate Sedation:      Per Anesthesia Care Recommendation:           - Patient has a contact number available for                            emergencies. The signs and symptoms of potential                            delayed complications were  discussed with the                            patient. Return to normal activities tomorrow.                            Written discharge instructions were provided to the                            patient.                           - Soft diet for 3 days, then advance as tolerated                            to low fat diet.                           - Continue present medications.                           - Await pathology results. CONSIDER GES AND                            ESOPHAGEAL MANOMETRY/Ph/IMPEDANCE IF DYSPHAGIA NOT                            IMPROVED.                           - Return to my office in 4 months TO REASSESS                            SWALLOWING AND NEED FOR GES.                           - DISCUSSED FINDINGS WITH PT'S FRIEND @[phone]. Procedure Code(s):        --- Professional ---                           [phone], Esophagogastroduodenoscopy, flexible,  transoral; with insertion of guide wire followed by                            passage of dilator(s) through esophagus over guide                            wire Diagnosis Code(s):        --- Professional ---                           K22.4, Dyskinesia of esophagus                           R13.10, Dysphagia, unspecified CPT copyright 2019 American Medical Association. All rights reserved. The codes documented in this report are preliminary and upon coder review may  be revised to meet current compliance requirements. Barney Drain, MD Barney Drain MD, MD 11/04/2018 8:16:53 AM This report has been signed electronically. Number of Addenda: 0

## 2018-11-04 NOTE — H&P (Signed)
Primary Care Physician:  Neale Burly, MD Primary Gastroenterologist:  Dr. Oneida Alar  Pre-Procedure History & Physical: HPI:  Kathy Hardy is a 63 y.o. female here for DYSPHAGIA.  Past Medical History:  Diagnosis Date  . Allergy   . Anxiety   . Anxiety   . Arthritis   . Asthma   . Chronic diastolic CHF (congestive heart failure) ([place])   . DDD (degenerative disc disease)    lumbar  . Diabetes mellitus   . 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 Diabetic neuropathy ([place])   . GERD (gastroesophageal reflux disease)   . Glaucoma   . Hyperlipidemia   . Hypertension   . Insomnia   . Neuropathy   . Peripheral vascular disease ([place])   . 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 RBBB   . Sleep apnea    Stop Bang score of 4  . Stress incontinence   . Stroke ([place])    TIAx 4,   . Substance abuse ([place])    cocaine  . Uterine cancer ([place]) 2000   unknown what type    Past Surgical History:  Procedure Laterality Date  . CARPAL TUNNEL RELEASE Bilateral   . CHOLECYSTECTOMY     [place]  . COLONOSCOPY  07/09/2011   HBZ:JIRCVELF hemorrhoids/Poor prep in the right colon, repeat in 2017  . ESOPHAGOGASTRODUODENOSCOPY  07/09/2011   YBO:FBPZ gastritis  . HERNIA REPAIR    . PARS PLANA VITRECTOMY  07/01/2012   Procedure: PARS PLANA VITRECTOMY WITH 25 GAUGE;  Surgeon: Hayden Pedro, MD;  Location: [facility];  Service: Ophthalmology;  Laterality: Right;  with Laser treatment, Membrane peel, Gas Injection and Repair of Traction Retinal Detachment RIGHT eye  . RETINAL DETACHMENT SURGERY Right 07/01/2012  . Right leg surgery x 4 (polio)      Prior to Admission medications   Medication Sig Start Date End Date Taking? Authorizing Provider  albuterol (PROVENTIL HFA;VENTOLIN HFA) 108 (90 BASE) MCG/ACT inhaler Inhale 2 puffs into the lungs every 6 (six) hours as needed for wheezing or shortness of breath.   Yes [provider]  amLODipine (NORVASC) 5 MG tablet TAKE 1 TABLET BY MOUTH DAILY.  10/28/13  Yes [place], Modena Nunnery, MD  budesonide-formoterol [facility]) 160-4.5 MCG/ACT inhaler Inhale 2 puffs into the lungs 2 (two) times daily.   Yes [provider]  carvedilol (COREG) 3.125 MG tablet TAKE (1) TABLET TWICE DAILY. 01/27/18  Yes Herminio Commons, MD  clopidogrel (PLAVIX) 75 MG tablet TAKE 1 TABLET BY MOUTH EVERY MORNING - PT WANTS SUN PHARM. BRAND Patient taking differently: TAKE 1 TABLET BY MOUTH EVERY MORNING - 12/25/13  Yes [place], Modena Nunnery, MD  CRESTOR 20 MG tablet TAKE 1 TABLET BY MOUTH EVERY DAY 09/22/13  Yes [place], Modena Nunnery, MD  dicyclomine (BENTYL) 10 MG capsule TAKE 1 CAPSULE BY MOUTH EVERY 4 TO 6 HOURS AS NEEDED FOR DIARRHEA OR ABDOMINAL CRAMPS. 04/26/14  Yes [place], Modena Nunnery, MD  FLUoxetine (PROZAC) 40 MG capsule TAKE 1 CAPSULE BY MOUTH DAILY. (DOSE CHANGE) 02/22/14  Yes , Modena Nunnery, MD  furosemide (LASIX) 20 MG tablet Take by mouth daily.  05/27/18  Yes [provider]  gabapentin (NEURONTIN) 300 MG capsule Take 1 capsule (300 mg total) by mouth 2 (two) times daily. 06/16/18  Yes Garvin Fila, MD  hydrOXYzine (ATARAX/VISTARIL) 25 MG tablet Take 25 mg by mouth 3 (three) times daily as needed for anxiety or itching.   Yes  [provider]  hyoscyamine (LEVSIN SL) 0.125 MG SL tablet Place 1 tablet (0.125 mg total) under the tongue every 4 (four) hours as needed. 12/01/13  Yes Annitta Needs, NP  Insulin Glargine (LANTUS SOLOSTAR) 100 UNIT/ML Solostar Pen Inject 30 Units into the skin daily at 10 pm. 02/22/14  Yes [place], Modena Nunnery, MD  Insulin Glargine (LANTUS) 100 UNIT/ML Solostar Pen Inject into the skin. 05/27/18 05/27/19 Yes [provider]  levocetirizine (XYZAL) 5 MG tablet TAKE 1 TABLET IN THE EVENING. 04/21/14  Yes [place], Modena Nunnery, MD  lisinopril (PRINIVIL,ZESTRIL) 20 MG tablet Take 20 mg by mouth daily.   Yes [provider]  LORazepam (ATIVAN) 1 MG tablet Take 1 tablet (1 mg total) by mouth 3 (three) times daily as  needed for anxiety. 09/22/14  Yes Daleen Bo, MD  meclizine (ANTIVERT) 25 MG tablet TAKE 1 TABLET BY MOUTH THREE TIMES DAILY AS NEEDED 04/27/13  Yes [place], Modena Nunnery, MD  metFORMIN (GLUCOPHAGE) 500 MG tablet Take 500 mg by mouth 2 (two) times daily with a meal.   Yes [provider]  montelukast (SINGULAIR) 10 MG tablet Take 10 mg by mouth at bedtime.   Yes [provider]  ondansetron (ZOFRAN ODT) 4 MG disintegrating tablet Take 1 tablet (4 mg total) by mouth every 8 (eight) hours as needed for nausea. 03/21/18  Yes Noemi Chapel, MD  pantoprazole (PROTONIX) 40 MG tablet Take 1 tablet (40 mg total) by mouth 2 (two) times daily before a meal. 08/13/18  Yes Rossi Silvestro L, MD  rosuvastatin (CRESTOR) 20 MG tablet Take by mouth. 05/27/18  Yes [provider]  glucose blood test strip Check blood sugar 4 times daily 05/27/18 05/27/19  [provider]  Misc. Devices MISC Life line 05/27/18   [provider]  Misc. Devices MISC depends 05/27/18   [provider]  Misc. Devices MISC Compression hose  15 mm thigh high 05/27/18   [provider]    Allergies as of 08/13/2018 - Review Complete 08/13/2018  Allergen Reaction Noted  . Oxycodone Nausea Only and Itching 03/15/2011  . Latex Hives 05/17/2014  . Aspirin  09/24/2017    Family History  Problem Relation Age of Onset  . Diabetes Mother   . Hypertension Mother   . Arthritis Mother   . Diabetes Sister   . Arthritis Father   . Kidney disease Father        kidney failure  . Cancer Maternal Grandmother        type?  Marland Kitchen Hypertension Son   . Seizures Sister   . Pneumonia Brother   . Early death Brother 31       goiter  . Hypertension Son   . Anesthesia problems Neg Hx   . Hypotension Neg Hx   . Malignant hyperthermia Neg Hx   . Pseudochol deficiency Neg Hx   . Colon cancer Neg Hx   . Colon polyps Neg Hx     Social History   Socioeconomic History  . Marital status:  Legally Separated    Spouse name: Not on file  . Number of children: 2  . Years of education: 34  . Highest education level: Not on file  Occupational History  . Occupation: disabled    Comment: "sugar", orthopedic  Social Needs  . Financial resource strain: Not on file  . Food insecurity:    Worry: Not on file    Inability: Not on file  . Transportation needs:  Medical: Not on file    Non-medical: Not on file  Tobacco Use  . Smoking status: Former Smoker    Last attempt to quit: 07/31/2003    Years since quitting: 15.2  . Smokeless tobacco: Never Used  Substance and Sexual Activity  . Alcohol use: Yes    Comment: occ beer  . Drug use: No  . Sexual activity: Not Currently    Birth control/protection: None  Lifestyle  . Physical activity:    Days per week: Not on file    Minutes per session: Not on file  . Stress: Not on file  Relationships  . Social connections:    Talks on phone: Not on file    Gets together: Not on file    Attends religious service: Not on file    Active member of club or organization: Not on file    Attends meetings of clubs or organizations: Not on file    Relationship status: Not on file  . Intimate partner violence:    Fear of current or ex partner: Not on file    Emotionally abused: Not on file    Physically abused: Not on file    Forced sexual activity: Not on file  Other Topics Concern  . Not on file  Social History Narrative   Lives at home with boyfriend   Disabled from back/leg/diabetes    Review of Systems: See HPI, otherwise negative ROS   Physical Exam: BP (!) 173/87   Pulse 90   Temp 98 F (36.7 C) (Oral)   Resp 20   SpO2 99%  General:   Alert,  pleasant and cooperative in NAD Head:  Normocephalic and atraumatic. Neck:  Supple; Lungs:  Clear throughout to auscultation.    Heart:  Regular rate and rhythm. Abdomen:  Soft, nontender and nondistended. Normal bowel sounds, without guarding, and without rebound.    Neurologic:  Alert and  oriented x4;  grossly normal neurologically.  Impression/Plan:     DYSPHAGIA  PLAN:  EGD/DIL TODAY.  DISCUSSED PROCEDURE, BENEFITS, & RISKS: < 1% chance of medication reaction, bleeding, perforation, or ASPIRATION.

## 2018-11-04 NOTE — Discharge Instructions (Signed)
I STRETCHED your esophagus. I DID NOT SEE A DEFINITE NARROWING IN YOUR esophagus.    RE-START PLAVIX TODAY.  DRINK WATER TO KEEP YOUR URINE LIGHT YELLOW.  FOLLOW A SOFT MECHANICAL DIET FOR 3 DAYS THEN ADVANCE TO A REGULAR DIET IF YOU DO NOT HAVE PROBLEMS SWALLOWING. SEE INFO BELOW. MEATS SHOULD B CHOPPED OR GROUND ONLY.  PLEASE CALL IN ONE MONTH IF YOUR SYMPTOMS ARE NOT IMPROVED.   FOLLOW UP IN 4 MOS. UPPER ENDOSCOPY AFTER CARE Read the instructions outlined below and refer to this sheet in the next week. These discharge instructions provide you with general information on caring for yourself after you leave the hospital. While your treatment has been planned according to the most current medical practices available, unavoidable complications occasionally occur. If you have any problems or questions after discharge, call DR. Suanne Minahan, [phone].  ACTIVITY  You may resume your regular activity, but move at a slower pace for the next 24 hours.   Take frequent rest periods for the next 24 hours.   Walking will help get rid of the air and reduce the bloated feeling in your belly (abdomen).   No driving for 24 hours (because of the medicine (anesthesia) used during the test).   You may shower.   Do not sign any important legal documents or operate any machinery for 24 hours (because of the anesthesia used during the test).    NUTRITION  Drink plenty of fluids.   You may resume your normal diet as instructed by your doctor.   Begin with a light meal and progress to your normal diet. Heavy or fried foods are harder to digest and may make you feel sick to your stomach (nauseated).   Avoid alcoholic beverages for 24 hours or as instructed.    MEDICATIONS  You may resume your normal medications.   WHAT YOU CAN EXPECT TODAY  Some feelings of bloating in the abdomen.   Passage of more gas than usual.    IF YOU HAD A BIOPSY TAKEN DURING THE UPPER ENDOSCOPY:  Eat a soft  diet IF YOU HAVE NAUSEA, BLOATING, ABDOMINAL PAIN, OR VOMITING.    FINDING OUT THE RESULTS OF YOUR TEST Not all test results are available during your visit. DR. Oneida Alar WILL CALL YOU WITHIN 7 DAYS OF YOUR PROCEDUE WITH YOUR RESULTS. Do not assume everything is normal if you have not heard from DR. Hula Tasso IN ONE WEEK, CALL HER OFFICE AT [phone].  SEEK IMMEDIATE MEDICAL ATTENTION AND CALL THE OFFICE: [phone] IF:  You have more than a spotting of blood in your stool.   Your belly is swollen (abdominal distention).   You are nauseated or vomiting.   You have a temperature over 101F.   You have abdominal pain or discomfort that is severe or gets worse throughout the day.  DYSPHAGIA DYSPHAGIA can be caused by stomach acid backing up into the tube that carries food from the mouth down to the stomach (lower esophagus) OR BY A YEAST INFECTION OF THE ESOPHAGUS.  TREATMENT There are a number of medicines used to treat DYSPHAGIA including: Antacids.  Proton-pump inhibitors: PROTONIX TAGAMET OR PEPCID   SOFT MECHANICAL DIET This SOFT MECHANICAL DIET is restricted to:  Foods that are moist, soft-textured, and easy to chew and swallow. MEATS SHOULD BE CHOPPED OR GROUND.  Meats that are ground or are minced no larger than one-quarter inch pieces. Meats are moist with gravy or sauce added.   Foods that do not include bread  or bread-like textures except soft pancakes, well-moistened with syrup or sauce.   Textures with some chewing ability required.   Casseroles without rice.   Cooked vegetables that are less than half an inch in size and easily mashed with a fork. No cooked corn, peas, broccoli, cauliflower, cabbage, Brussels sprouts, asparagus, or other fibrous, non-tender or rubbery cooked vegetables.   Canned fruit except for pineapple. Fruit must be cut into pieces no larger than half an inch in size.   Foods that do not include nuts, seeds, coconut, or sticky textures.

## 2018-11-04 NOTE — Progress Notes (Signed)
From ENDO procedure. Awake. Talking. Swallowing without difficulty. Needs to void. Placed on bedpan. Voided large amt of clear yellow urine. Bedpan overflowed. Stretcher saturated. Bedpan removed. Perineum cleansed and up to w/c. Transferred to post-op rm 7. Tolerated well. Denies pain.

## 2018-11-04 NOTE — Anesthesia Postprocedure Evaluation (Signed)
Anesthesia Post Note  Patient: Kathy Hardy  Procedure(s) Performed: ESOPHAGOGASTRODUODENOSCOPY (EGD) WITH PROPOFOL (N/A ) SAVORY DILATION (N/A ) ESOPHAGEAL BRUSHING  Patient location during evaluation: PACU Anesthesia Type: General Level of consciousness: awake and alert and patient cooperative Pain management: pain level controlled Vital Signs Assessment: post-procedure vital signs reviewed and stable Respiratory status: spontaneous breathing, nonlabored ventilation and respiratory function stable Cardiovascular status: blood pressure returned to baseline Postop Assessment: no apparent nausea or vomiting Anesthetic complications: no     Last Vitals:  Vitals:   11/04/18 0637  BP: (!) 173/87  Pulse: 90  Resp: 20  Temp: 36.7 C  SpO2: 99%    Last Pain:  Vitals:   11/04/18 0637  TempSrc: Oral  PainSc: 0-No pain                 Khai Torbert J

## 2018-11-04 NOTE — Transfer of Care (Signed)
Immediate Anesthesia Transfer of Care Note  Patient: Kathy Hardy  Procedure(s) Performed: ESOPHAGOGASTRODUODENOSCOPY (EGD) WITH PROPOFOL (N/A ) SAVORY DILATION (N/A ) ESOPHAGEAL BRUSHING  Patient Location: PACU  Anesthesia Type:MAC  Level of Consciousness: awake and patient cooperative  Airway & Oxygen Therapy: Patient Spontanous Breathing  Post-op Assessment: Report given to RN, Post -op Vital signs reviewed and stable and Patient moving all extremities  Post vital signs: Reviewed and stable  Last Vitals:  Vitals Value Taken Time  BP    Temp    Pulse    Resp    SpO2      Last Pain:  Vitals:   11/04/18 0637  TempSrc: Oral  PainSc: 0-No pain      Patients Stated Pain Goal: 4 (86/57/84 [id number])  Complications: No apparent anesthesia complications

## 2018-11-06 ENCOUNTER — Encounter (HOSPITAL_COMMUNITY): Payer: Self-pay | Admitting: Gastroenterology

## 2018-12-04 ENCOUNTER — Encounter: Payer: Self-pay | Admitting: Cardiovascular Disease

## 2018-12-04 ENCOUNTER — Other Ambulatory Visit: Payer: Self-pay

## 2018-12-04 ENCOUNTER — Ambulatory Visit (INDEPENDENT_AMBULATORY_CARE_PROVIDER_SITE_OTHER): Payer: Medicaid Other | Admitting: Cardiovascular Disease

## 2018-12-04 VITALS — BP 167/81 | HR 85 | Temp 98.1°F | Ht 62.0 in | Wt 143.0 lb

## 2018-12-04 DIAGNOSIS — I5032 Chronic diastolic (congestive) heart failure: Secondary | ICD-10-CM

## 2018-12-04 DIAGNOSIS — E1065 Type 1 diabetes mellitus with hyperglycemia: Secondary | ICD-10-CM

## 2018-12-04 DIAGNOSIS — I10 Essential (primary) hypertension: Secondary | ICD-10-CM

## 2018-12-04 DIAGNOSIS — R079 Chest pain, unspecified: Secondary | ICD-10-CM

## 2018-12-04 DIAGNOSIS — E785 Hyperlipidemia, unspecified: Secondary | ICD-10-CM

## 2018-12-04 DIAGNOSIS — Z8673 Personal history of transient ischemic attack (TIA), and cerebral infarction without residual deficits: Secondary | ICD-10-CM

## 2018-12-04 NOTE — Patient Instructions (Signed)
Medication Instructions: Your physician recommends that you continue on your current medications as directed. Please refer to the Current Medication list given to you today.   Labwork: None today  Procedures/Testing: Your physician has requested that you have a lexiscan myoview. For further information please visit [email or web address]. Please follow instruction sheet, as given.    Follow-Up: 6 week telehealth visit with Bernerd Pho PA-C  Any Additional Special Instructions Will Be Listed Below (If Applicable).     If you need a refill on your cardiac medications before your next appointment, please call your pharmacy.

## 2018-12-04 NOTE — Progress Notes (Signed)
SUBJECTIVE: The patient came to the office for a visit.  Multiple attempts were made to try to contact her regarding a telehealth visit but the patient did not answer her phone and claims that it is not working.  I have evaluated her once before on 12/06/2017 and she was supposed to have followed up 3 months later but did not.  She has a history of diastolic heart failure.  Echocardiogram performed at [facility] in February 2019 demonstrated normal left ventricular systolic function, EF 55 to 60%, mild concentric LVH, grade 2 diastolic dysfunction with elevated left ventricular end-diastolic pressures, mild left atrial dilatation, and mild tricuspid regurgitation with moderate pulmonary hypertension.  She tells me she has been having retrosternal chest pains intermittently over the past week.  They can occur both with and without exertion but seem to be worse with exertion.  She has pain, tingling and numbness down both arms.  She also has shooting sharp pains down her legs.  She has been bothered by seasonal allergies and using her inhaler.  She denies leg swelling, orthopnea, and PND.  ECG performed in the office today which I ordered and personally interpreted demonstrates normal sinus rhythm with right bundle branch block.   Review of Systems: As per "subjective", otherwise negative.  Allergies  Allergen Reactions  . Oxycodone Nausea Only and Itching    Nausea one time  . Latex Hives    Hives only  . Aspirin     Swelling, hives     Current Outpatient Medications  Medication Sig Dispense Refill  . albuterol (PROVENTIL HFA;VENTOLIN HFA) 108 (90 BASE) MCG/ACT inhaler Inhale 2 puffs into the lungs every 6 (six) hours as needed for wheezing or shortness of breath.    Marland Kitchen amLODipine (NORVASC) 5 MG tablet TAKE 1 TABLET BY MOUTH DAILY. 30 tablet 6  . budesonide-formoterol (SYMBICORT) 160-4.5 MCG/ACT inhaler Inhale 2 puffs into the lungs 2 (two) times daily.    . carvedilol (COREG) 3.125 MG  tablet TAKE (1) TABLET TWICE DAILY. 60 tablet 1  . clopidogrel (PLAVIX) 75 MG tablet TAKE 1 TABLET BY MOUTH EVERY MORNING - PT WANTS SUN PHARM. BRAND (Patient taking differently: TAKE 1 TABLET BY MOUTH EVERY MORNING -) 30 tablet 3  . CRESTOR 20 MG tablet TAKE 1 TABLET BY MOUTH EVERY DAY 30 tablet 6  . dicyclomine (BENTYL) 10 MG capsule TAKE 1 CAPSULE BY MOUTH EVERY 4 TO 6 HOURS AS NEEDED FOR DIARRHEA OR ABDOMINAL CRAMPS. 120 capsule 0  . FLUoxetine (PROZAC) 40 MG capsule TAKE 1 CAPSULE BY MOUTH DAILY. (DOSE CHANGE) 30 capsule 3  . furosemide (LASIX) 20 MG tablet Take by mouth daily.     Marland Kitchen gabapentin (NEURONTIN) 300 MG capsule Take 1 capsule (300 mg total) by mouth 2 (two) times daily. 30 capsule 6  . glucose blood test strip Check blood sugar 4 times daily    . hydrOXYzine (ATARAX/VISTARIL) 25 MG tablet Take 25 mg by mouth 3 (three) times daily as needed for anxiety or itching.    . hyoscyamine (LEVSIN SL) 0.125 MG SL tablet Place 1 tablet (0.125 mg total) under the tongue every 4 (four) hours as needed. 120 tablet 3  . Insulin Glargine (LANTUS SOLOSTAR) 100 UNIT/ML Solostar Pen Inject 30 Units into the skin daily at 10 pm. 10 pen 3  . Insulin Glargine (LANTUS) 100 UNIT/ML Solostar Pen Inject into the skin.    Marland Kitchen lisinopril (PRINIVIL,ZESTRIL) 20 MG tablet Take 20 mg by mouth daily.    Marland Kitchen  LORazepam (ATIVAN) 1 MG tablet Take 1 tablet (1 mg total) by mouth 3 (three) times daily as needed for anxiety. 20 tablet 0  . meclizine (ANTIVERT) 25 MG tablet TAKE 1 TABLET BY MOUTH THREE TIMES DAILY AS NEEDED 30 tablet 0  . metFORMIN (GLUCOPHAGE) 500 MG tablet Take 500 mg by mouth 2 (two) times daily with a meal.    . Misc. Devices MISC Life line    . Misc. Devices MISC depends    . Misc. Devices MISC Compression hose  15 mm thigh high    . montelukast (SINGULAIR) 10 MG tablet Take 10 mg by mouth at bedtime.    . ondansetron (ZOFRAN ODT) 4 MG disintegrating tablet Take 1 tablet (4 mg total) by mouth every 8  (eight) hours as needed for nausea. 10 tablet 0  . pantoprazole (PROTONIX) 40 MG tablet Take 1 tablet (40 mg total) by mouth 2 (two) times daily before a meal. 60 tablet 11   Current Facility-Administered Medications  Medication Dose Route Frequency Provider Last Rate Last Dose  . Influenza (>/= 3 years) inactive virus vaccine (FLVIRIN/FLUZONE) injection SUSP 0.5 mL  0.5 mL Intramuscular Once Fayrene Helper, MD        Past Medical History:  Diagnosis Date  . Allergy   . Anxiety   . Anxiety   . Arthritis   . Asthma   . Chronic diastolic CHF (congestive heart failure) ([place])   . DDD (degenerative disc disease)    lumbar  . Diabetes mellitus   . 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 Diabetic neuropathy ([place])   . GERD (gastroesophageal reflux disease)   . Glaucoma   . Hyperlipidemia   . Hypertension   . Insomnia   . Neuropathy   . Peripheral vascular disease ([place])   . 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 RBBB   . Sleep apnea    Stop Bang score of 4  . Stress incontinence   . Stroke ([place])    TIAx 4,   . Substance abuse ([place])    cocaine  . Uterine cancer ([place]) 2000   unknown what type    Past Surgical History:  Procedure Laterality Date  . CARPAL TUNNEL RELEASE Bilateral   . CHOLECYSTECTOMY     [place]  . COLONOSCOPY  07/09/2011   [id number] hemorrhoids/Poor prep in the right colon, repeat in 2017  . ESOPHAGEAL BRUSHING  11/04/2018   Procedure: ESOPHAGEAL BRUSHING;  Surgeon: Danie Binder, MD;  [facility];  Service: Endoscopy;;  . ESOPHAGOGASTRODUODENOSCOPY  07/09/2011   VOH:YWVP gastritis  . ESOPHAGOGASTRODUODENOSCOPY (EGD) WITH PROPOFOL N/A 11/04/2018   Procedure: ESOPHAGOGASTRODUODENOSCOPY (EGD) WITH PROPOFOL;  Surgeon: Danie Binder, MD;  [facility];  Service: Endoscopy;  Laterality: N/A;  8:30am  . HERNIA REPAIR    . PARS PLANA VITRECTOMY  07/01/2012   Procedure: PARS PLANA VITRECTOMY WITH 25 GAUGE;  Surgeon: Hayden Pedro, MD;   Location: [facility];  Service: Ophthalmology;  Laterality: Right;  with Laser treatment, Membrane peel, Gas Injection and Repair of Traction Retinal Detachment RIGHT eye  . RETINAL DETACHMENT SURGERY Right 07/01/2012  . Right leg surgery x 4 (polio)    . SAVORY DILATION N/A 11/04/2018   Procedure: SAVORY DILATION;  Surgeon: Danie Binder, MD;  [facility];  Service: Endoscopy;  Laterality: N/A;    Social History   Socioeconomic History  . Marital status: Legally Separated  Spouse name: Not on file  . Number of children: 2  . Years of education: 78  . Highest education level: Not on file  Occupational History  . Occupation: disabled    Comment: "sugar", orthopedic  Social Needs  . Financial resource strain: Not on file  . Food insecurity:    Worry: Not on file    Inability: Not on file  . Transportation needs:    Medical: Not on file    Non-medical: Not on file  Tobacco Use  . Smoking status: Former Smoker    Last attempt to quit: 07/31/2003    Years since quitting: 15.3  . Smokeless tobacco: Never Used  Substance and Sexual Activity  . Alcohol use: Yes    Comment: occ beer  . Drug use: No  . Sexual activity: Not Currently    Birth control/protection: None  Lifestyle  . Physical activity:    Days per week: Not on file    Minutes per session: Not on file  . Stress: Not on file  Relationships  . Social connections:    Talks on phone: Not on file    Gets together: Not on file    Attends religious service: Not on file    Active member of club or organization: Not on file    Attends meetings of clubs or organizations: Not on file    Relationship status: Not on file  . Intimate partner violence:    Fear of current or ex partner: Not on file    Emotionally abused: Not on file    Physically abused: Not on file    Forced sexual activity: Not on file  Other Topics Concern  . Not on file  Social History Narrative   Lives at home with boyfriend   Disabled from  back/leg/diabetes     Vitals:   12/04/18 1043  BP: (!) 167/81  Temp: 98.1 F (36.7 C)  TempSrc: Oral  Weight: 143 lb (64.9 kg)  Height: 5\' 2"  (1.575 m)  Pulse: 85 bpm  Wt Readings from Last 3 Encounters:  12/04/18 143 lb (64.9 kg)  10/31/18 144 lb 6.4 oz (65.5 kg)  08/13/18 144 lb 6.4 oz (65.5 kg)     PHYSICAL EXAM General: NAD HEENT: Normal.  Wearing a mask. Neck: No JVD, no thyromegaly. Lungs: Diminished sounds throughout, no crackles or wheezes. CV: Regular rate and rhythm, normal S1/S2, no S3/S4, no murmur. No pretibial or periankle edema.  No carotid bruit.   Abdomen: Soft, nontender, no distention.  Neurologic: Alert and oriented.  Psych: Normal affect. Skin: Normal. Musculoskeletal: No gross deformities.    ECG: Reviewed above under Subjective   Labs: Lab Results  Component Value Date/Time   K 4.1 11/04/2018 06:58 AM   BUN 31 (H) 11/04/2018 06:58 AM   CREATININE 0.80 11/04/2018 06:58 AM   CREATININE 0.85 09/14/2013 10:18 AM   ALT 12 11/04/2018 06:58 AM   TSH 2.557 09/14/2013 10:18 AM   HGB 11.0 (L) 11/04/2018 06:58 AM     Lipids: Lab Results  Component Value Date/Time   LDLCALC 82 06/16/2018 11:51 AM   LDLDIRECT 122 (H) 10/23/2012 11:25 AM   CHOL 164 06/16/2018 11:51 AM   TRIG 63 06/16/2018 11:51 AM   HDL 69 06/16/2018 11:51 AM       ASSESSMENT AND PLAN:  1.  Chronic diastolic heart failure: Currently on Lasix 20 mg daily.  She appears euvolemic.  Blood pressure is elevated but she has not taken any antihypertensive medications  today.  2.  Hyperlipidemia: LDL 82 on 06/16/2018.  Currently on rosuvastatin 20 mg.  3.  Hypertension: Blood pressure is elevated but she has not taken any antihypertensive medications today.  4.  Poorly controlled diabetes mellitus: A1c 10.9% on 06/16/2018.  Currently on metformin and insulin.  Managed by PCP.  5.  History of multiple TIAs: Currently on Plavix and Crestor.  6.  Chest pains: Both typical and  atypical features. I will proceed with a nuclear myocardial perfusion imaging study to evaluate for ischemic heart disease (Lexiscan Myoview).    Disposition: Follow up 6 weeks with [facility]  Time spent: 40 minutes, of which greater than 50% was spent reviewing symptoms, relevant blood tests and studies, and discussing management plan with the patient.    Kate Sable, M.D., F.A.C.C.

## 2019-01-01 ENCOUNTER — Encounter (HOSPITAL_COMMUNITY): Payer: Self-pay

## 2019-01-01 ENCOUNTER — Encounter (HOSPITAL_COMMUNITY): Payer: Medicaid Other

## 2019-01-01 ENCOUNTER — Other Ambulatory Visit: Payer: Self-pay

## 2019-01-01 ENCOUNTER — Ambulatory Visit (HOSPITAL_COMMUNITY)
Admission: RE | Admit: 2019-01-01 | Discharge: 2019-01-01 | Disposition: A | Discharge status: Home / Self Care | Payer: Medicaid Other | Source: Ambulatory Visit | Attending: Cardiovascular Disease | Admitting: Cardiovascular Disease

## 2019-01-01 DIAGNOSIS — R079 Chest pain, unspecified: Secondary | ICD-10-CM | POA: Insufficient documentation

## 2019-01-01 LAB — NM MYOCAR MULTI W/SPECT W/WALL MOTION / EF
LV dias vol: 79 mL (ref 46–106)
LV sys vol: 23 mL
Peak HR: 86 {beats}/min
RATE: 0.46
Rest HR: 80 {beats}/min
SDS: 0
SRS: 0
SSS: 0
TID: 1.14

## 2019-01-01 MED ORDER — TECHNETIUM TC 99M TETROFOSMIN IV KIT
30.0000 | PACK | Freq: Once | INTRAVENOUS | Status: AC | PRN
  Administered 2019-01-01: 27.5 via INTRAVENOUS

## 2019-01-01 MED ORDER — REGADENOSON 0.4 MG/5ML IV SOLN
INTRAVENOUS | Status: AC
  Administered 2019-01-01: 0.4 mg via INTRAVENOUS
  Filled 2019-01-01: qty 5

## 2019-01-01 MED ORDER — TECHNETIUM TC 99M TETROFOSMIN IV KIT
10.0000 | PACK | Freq: Once | INTRAVENOUS | Status: AC | PRN
  Administered 2019-01-01: 9.6 via INTRAVENOUS

## 2019-01-01 MED ORDER — SODIUM CHLORIDE 0.9% FLUSH
INTRAVENOUS | Status: AC
  Administered 2019-01-01: 10 mL via INTRAVENOUS
  Filled 2019-01-01: qty 10

## 2019-01-12 ENCOUNTER — Telehealth: Payer: Self-pay | Admitting: *Deleted

## 2019-01-12 NOTE — Telephone Encounter (Signed)
Notes recorded by Laurine Blazer, LPN on 7/35/6701 at 41:03 AM EDT  Patient notified. Copy to pmd.  ------   Notes recorded by Laurine Blazer, LPN on 0/07/3141 at 88:87 AM EDT  Left message to return call.   ------   Notes recorded by Laurine Blazer, LPN on 12/04/9726 at 2:06 PM EDT  No answer.  ------   Notes recorded by Herminio Commons, MD on 01/01/2019 at 4:22 PM EDT  Normal. No blockages.

## 2019-01-15 ENCOUNTER — Encounter: Payer: Self-pay | Admitting: Gastroenterology

## 2019-01-22 ENCOUNTER — Encounter: Payer: Self-pay | Admitting: Student

## 2019-01-22 ENCOUNTER — Ambulatory Visit (INDEPENDENT_AMBULATORY_CARE_PROVIDER_SITE_OTHER): Payer: Medicaid Other | Admitting: Student

## 2019-01-22 ENCOUNTER — Other Ambulatory Visit: Payer: Self-pay

## 2019-01-22 VITALS — BP 148/82 | HR 84 | Temp 98.7°F | Ht 62.0 in | Wt 148.0 lb

## 2019-01-22 DIAGNOSIS — E785 Hyperlipidemia, unspecified: Secondary | ICD-10-CM

## 2019-01-22 DIAGNOSIS — I10 Essential (primary) hypertension: Secondary | ICD-10-CM | POA: Diagnosis not present

## 2019-01-22 DIAGNOSIS — Z8673 Personal history of transient ischemic attack (TIA), and cerebral infarction without residual deficits: Secondary | ICD-10-CM

## 2019-01-22 DIAGNOSIS — R079 Chest pain, unspecified: Secondary | ICD-10-CM | POA: Diagnosis not present

## 2019-01-22 DIAGNOSIS — I5032 Chronic diastolic (congestive) heart failure: Secondary | ICD-10-CM | POA: Diagnosis not present

## 2019-01-22 MED ORDER — AMLODIPINE BESYLATE 10 MG PO TABS: 10.0000 mg | ORAL_TABLET | Freq: Every day | ORAL | 3 refills | Status: AC

## 2019-01-22 NOTE — Progress Notes (Signed)
Cardiology Office Note    Date:  01/22/2019   ID:  ASHLINN HEMRICK, DOB January 06, 1956, MRN [id number]  PCP:  Neale Burly, MD  Cardiologist: Kate Sable, MD    Chief Complaint  Patient presents with  . Follow-up    6 week visit    History of Present Illness:    Kathy Hardy is a 63 y.o. female with past medical history of chronic diastolic CHF, HTN, HLD, IDDM, and prior TIA's who presents to the office today for 6-week follow-up.   She most recently had a visit with Dr. Bronson Ing in 11/2018 and reported having retrosternal chest discomfort over the past week which could occur at rest or with activity. EKG was performed in office and showed normal sinus rhythm with known right bundle branch block. Given her symptoms and multiple cardiac risk factors, a Lexiscan Myoview was recommended for further evaluation. Was performed on 01/01/2019 and showed normal perfusion with no evidence of ischemia and was overall a low risk study.   In talking with the patient today, she denies any repeat episodes of chest discomfort. She does report having intermittent dyspnea on exertion which has been at baseline. Denies any specific orthopnea, PND, or lower extremity edema. She does not have scales at home but feels like her weight has overall been stable. She does take Lasix 20 mg most days of the week.  She does not have a blood pressure cuff at home and this was elevated at 169/63 during initial check today. Had improved to 148/82 on repeat check. She is unsure of her exact medication regimen and believes her PCP actually discontinued one of her medications that is currently listed but she is unsure which one.  Past Medical History:  Diagnosis Date  . Allergy   . Anxiety   . Anxiety   . Arthritis   . Asthma   . Chronic diastolic CHF (congestive heart failure) ([place])   . DDD (degenerative disc disease)    lumbar  . Diabetes mellitus   . 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  Diabetic neuropathy ([place])   . GERD (gastroesophageal reflux disease)   . Glaucoma   . Hyperlipidemia   . Hypertension   . Insomnia   . Neuropathy   . Peripheral vascular disease ([place])   . 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 RBBB   . Sleep apnea    Stop Bang score of 4  . Stress incontinence   . Stroke ([place])    TIAx 4,   . Substance abuse ([place])    cocaine  . Uterine cancer ([place]) 2000   unknown what type    Past Surgical History:  Procedure Laterality Date  . CARPAL TUNNEL RELEASE Bilateral   . CHOLECYSTECTOMY     [place]  . COLONOSCOPY  07/09/2011   [id number] hemorrhoids/Poor prep in the right colon, repeat in 2017  . ESOPHAGEAL BRUSHING  11/04/2018   Procedure: ESOPHAGEAL BRUSHING;  Surgeon: Danie Binder, MD;  [facility];  Service: Endoscopy;;  . ESOPHAGOGASTRODUODENOSCOPY  07/09/2011   IWL:NLGX gastritis  . ESOPHAGOGASTRODUODENOSCOPY (EGD) WITH PROPOFOL N/A 11/04/2018   Procedure: ESOPHAGOGASTRODUODENOSCOPY (EGD) WITH PROPOFOL;  Surgeon: Danie Binder, MD;  [facility];  Service: Endoscopy;  Laterality: N/A;  8:30am  . HERNIA REPAIR    . PARS PLANA VITRECTOMY  07/01/2012   Procedure: PARS PLANA VITRECTOMY WITH 25 GAUGE;  Surgeon: Hayden Pedro, MD;  [facility]  OR;  Service: Ophthalmology;  Laterality: Right;  with Laser treatment, Membrane peel, Gas Injection and Repair of Traction Retinal Detachment RIGHT eye  . RETINAL DETACHMENT SURGERY Right 07/01/2012  . Right leg surgery x 4 (polio)    . SAVORY DILATION N/A 11/04/2018   Procedure: SAVORY DILATION;  Surgeon: Danie Binder, MD;  [facility];  Service: Endoscopy;  Laterality: N/A;    Current Medications: Outpatient Medications Prior to Visit  Medication Sig Dispense Refill  . albuterol (PROVENTIL HFA;VENTOLIN HFA) 108 (90 BASE) MCG/ACT inhaler Inhale 2 puffs into the lungs every 6 (six) hours as needed for wheezing or shortness of breath.    . budesonide-formoterol (SYMBICORT)  160-4.5 MCG/ACT inhaler Inhale 2 puffs into the lungs 2 (two) times daily.    . carvedilol (COREG) 3.125 MG tablet TAKE (1) TABLET TWICE DAILY. 60 tablet 1  . clopidogrel (PLAVIX) 75 MG tablet TAKE 1 TABLET BY MOUTH EVERY MORNING - PT WANTS SUN PHARM. BRAND (Patient taking differently: TAKE 1 TABLET BY MOUTH EVERY MORNING -) 30 tablet 3  . CRESTOR 20 MG tablet TAKE 1 TABLET BY MOUTH EVERY DAY 30 tablet 6  . dicyclomine (BENTYL) 10 MG capsule TAKE 1 CAPSULE BY MOUTH EVERY 4 TO 6 HOURS AS NEEDED FOR DIARRHEA OR ABDOMINAL CRAMPS. 120 capsule 0  . FLUoxetine (PROZAC) 40 MG capsule TAKE 1 CAPSULE BY MOUTH DAILY. (DOSE CHANGE) 30 capsule 3  . furosemide (LASIX) 20 MG tablet Take by mouth daily.     Marland Kitchen gabapentin (NEURONTIN) 300 MG capsule Take 1 capsule (300 mg total) by mouth 2 (two) times daily. 30 capsule 6  . glucose blood test strip Check blood sugar 4 times daily    . hydrOXYzine (ATARAX/VISTARIL) 25 MG tablet Take 25 mg by mouth 3 (three) times daily as needed for anxiety or itching.    . hyoscyamine (LEVSIN SL) 0.125 MG SL tablet Place 1 tablet (0.125 mg total) under the tongue every 4 (four) hours as needed. 120 tablet 3  . Insulin Glargine (LANTUS SOLOSTAR) 100 UNIT/ML Solostar Pen Inject 30 Units into the skin daily at 10 pm. 10 pen 3  . Insulin Glargine (LANTUS) 100 UNIT/ML Solostar Pen Inject into the skin.    Marland Kitchen lisinopril (PRINIVIL,ZESTRIL) 20 MG tablet Take 20 mg by mouth daily.    Marland Kitchen LORazepam (ATIVAN) 1 MG tablet Take 1 tablet (1 mg total) by mouth 3 (three) times daily as needed for anxiety. 20 tablet 0  . meclizine (ANTIVERT) 25 MG tablet TAKE 1 TABLET BY MOUTH THREE TIMES DAILY AS NEEDED 30 tablet 0  . metFORMIN (GLUCOPHAGE) 500 MG tablet Take 500 mg by mouth 2 (two) times daily with a meal.    . Misc. Devices MISC Life line    . Misc. Devices MISC depends    . Misc. Devices MISC Compression hose  15 mm thigh high    . montelukast (SINGULAIR) 10 MG tablet Take 10 mg by mouth at  bedtime.    . ondansetron (ZOFRAN ODT) 4 MG disintegrating tablet Take 1 tablet (4 mg total) by mouth every 8 (eight) hours as needed for nausea. 10 tablet 0  . pantoprazole (PROTONIX) 40 MG tablet Take 1 tablet (40 mg total) by mouth 2 (two) times daily before a meal. 60 tablet 11  . amLODipine (NORVASC) 5 MG tablet TAKE 1 TABLET BY MOUTH DAILY. 30 tablet 6   Facility-Administered Medications Prior to Visit  Medication Dose Route Frequency Provider Last Rate Last Dose  .  Influenza (>/= 3 years) inactive virus vaccine (FLVIRIN/FLUZONE) injection SUSP 0.5 mL  0.5 mL Intramuscular Once Fayrene Helper, MD         Allergies:   Oxycodone, Latex, and Aspirin   Social History   Socioeconomic History  . Marital status: Legally Separated    Spouse name: Not on file  . Number of children: 2  . Years of education: 52  . Highest education level: Not on file  Occupational History  . Occupation: disabled    Comment: "sugar", orthopedic  Social Needs  . Financial resource strain: Not on file  . Food insecurity    Worry: Not on file    Inability: Not on file  . Transportation needs    Medical: Not on file    Non-medical: Not on file  Tobacco Use  . Smoking status: Former Smoker    Quit date: 07/31/2003    Years since quitting: 15.4  . Smokeless tobacco: Never Used  Substance and Sexual Activity  . Alcohol use: Yes    Comment: occ beer  . Drug use: No  . Sexual activity: Not Currently    Birth control/protection: None  Lifestyle  . Physical activity    Days per week: Not on file    Minutes per session: Not on file  . Stress: Not on file  Relationships  . Social Herbalist on phone: Not on file    Gets together: Not on file    Attends religious service: Not on file    Active member of club or organization: Not on file    Attends meetings of clubs or organizations: Not on file    Relationship status: Not on file  Other Topics Concern  . Not on file  Social History  Narrative   Lives at home with boyfriend   Disabled from back/leg/diabetes     Family History:  The patient's family history includes Arthritis in her father and mother; Cancer in her maternal grandmother; Diabetes in her mother and sister; Early death (age of onset: 34) in her brother; Hypertension in her mother, son, and son; Kidney disease in her father; Pneumonia in her brother; Seizures in her sister.   Review of Systems:   Please see the history of present illness.     General:  No chills, fever, night sweats or weight changes.  Cardiovascular:  No chest pain, edema, orthopnea, palpitations, paroxysmal nocturnal dyspnea. Positive for dyspnea on exertion.  Dermatological: No rash, lesions/masses Respiratory: No cough, dyspnea Urologic: No hematuria, dysuria Abdominal:   No nausea, vomiting, diarrhea, bright red blood per rectum, melena, or hematemesis Neurologic:  No visual changes, wkns, changes in mental status. All other systems reviewed and are otherwise negative except as noted above.   Physical Exam:    VS:  BP (!) 148/82   Pulse 84   Temp 98.7 F (37.1 C) (Temporal)   Ht 5\' 2"  (1.575 m)   Wt 148 lb (67.1 kg)   SpO2 98%   BMI 27.07 kg/m    General: Well developed, well nourished,female appearing in no acute distress. Head: Normocephalic, atraumatic, sclera non-icteric, no xanthomas, nares are without discharge.  Neck: No carotid bruits. JVD not elevated.  Lungs: Respirations regular and unlabored, without wheezes or rales.  Heart: Regular rate and rhythm. No S3 or S4.  No murmur, no rubs, or gallops appreciated. Abdomen: Soft, non-tender, non-distended with normoactive bowel sounds. No hepatomegaly. No rebound/guarding. No obvious abdominal masses. Msk:  Strength and tone  appear normal for age. No joint deformities or effusions. Extremities: No clubbing or cyanosis. No lower extremity edema.  Distal pedal pulses are 2+ bilaterally. Neuro: Alert and oriented X 3.  Moves all extremities spontaneously. No focal deficits noted. Psych:  Responds to questions appropriately with a normal affect. Skin: No rashes or lesions noted  Wt Readings from Last 3 Encounters:  01/22/19 148 lb (67.1 kg)  12/04/18 143 lb (64.9 kg)  10/31/18 144 lb 6.4 oz (65.5 kg)     Studies/Labs Reviewed:   EKG:  EKG is not ordered today.   Recent Labs: 11/04/2018: ALT 12; BUN 31; Creatinine, Ser 0.80; Hemoglobin 11.0; Platelets 192; Potassium 4.1; Sodium 136   Lipid Panel    Component Value Date/Time   CHOL 164 06/16/2018 1151   TRIG 63 06/16/2018 1151   HDL 69 06/16/2018 1151   CHOLHDL 2.4 06/16/2018 1151   CHOLHDL 2.5 09/14/2013 1018   VLDL 16 09/14/2013 1018   LDLCALC 82 06/16/2018 1151   LDLDIRECT 122 (H) 10/23/2012 1125    Additional studies/ records that were reviewed today include:   NST: 12/2018  There was no ST segment deviation noted during stress.  The study is normal.  This is a low risk study.  Nuclear stress EF: 71%.   Assessment:    1. Chronic diastolic CHF (congestive heart failure) (HCC)   2. Chest pain, unspecified type   3. Essential hypertension   4. Hyperlipidemia LDL goal <70   5. History of recurrent TIAs      Plan:   In order of problems listed above:  1. Chronic Diastolic CHF - She denies any changes in her respiratory status. No recent orthopnea, PND, or lower extremity edema. Weight has overall been stable. Will continue on Lasix 20 mg daily. Reviewed the importance of monitoring her sodium and fluid intake.   2. Atypical Chest Pain - Lexiscan Myoview on 01/01/2019 showed normal perfusion with no evidence of ischemia and was overall a low risk study. Reviewed results with the patient today. She denies any recurrent pain.  - continue with risk factor modification.   3. HTN - BP was elevated at 169/63 during initial check today, improved to 148/82 on repeat check. She is unsure of her exact medication regimen and believes  her PCP actually discontinued one of her medications that is currently listed but she is unsure which one. I have asked her to call the office back so we can confirm her current regimen. Currently listed as taking Amlodipine 5 mg daily, Coreg 3.125 mg twice daily, and Lisinopril 20 mg daily.  Will titrate Amlodipine to 10 mg daily.  4. HLD - Followed by PCP. FLP in 05/2018 showed total cholesterol 164, triglycerides 63, HDL 69, and LDL 82.  She reports having repeat labs obtained recently and will request these records.  She remains on Crestor 20 mg daily.  If LDL remains above 70 in the setting of her prior TIA's, this could be titrated to 40 mg daily.  5. History of TIA's - followed by Neurology. She remains on Plavix and statin therapy.   Medication Adjustments/Labs and Tests Ordered: Current medicines are reviewed at length with the patient today.  Concerns regarding medicines are outlined above.  Medication changes, Labs and Tests ordered today are listed in the Patient Instructions below. Patient Instructions  Medication Instructions: INCREASE Amlodipine to 10 mg daily   Labwork: none  Procedures/Testing: none  Follow-Up: 3 months with Dr.Koneswaran  Any Additional Special Instructions Will  Be Listed Below (If Applicable).  Call us when you get home and let us know if you are taking Coreg and Lisinopril [phone] If you need a refill on your cardiac medications before your next appointment, please call your pharmacy.   Thank you for choosing [facility] !        Signed, Erma Heritage, PA-C  01/22/2019 5:02 PM    [facility] [address] [place], [place] [address] Phone: [phone] Fax: [phone]

## 2019-01-22 NOTE — Patient Instructions (Addendum)
Medication Instructions: INCREASE Amlodipine to 10 mg daily   Labwork: none  Procedures/Testing: none  Follow-Up: 3 months with Dr.Koneswaran  Any Additional Special Instructions Will Be Listed Below (If Applicable).   Call us when you get home and let us know if you are taking Coreg and Lisinopril [phone] If you need a refill on your cardiac medications before your next appointment, please call your pharmacy.   Thank you for choosing [facility] !

## 2020-05-30 DEATH — deceased
